# Patient Record
Sex: Female | Born: 1950 | Race: White | Hispanic: No | Marital: Married | Smoking: Never smoker
Health system: Southern US, Community
[De-identification: ages and names within clinical notes are randomized; demographics above are authoritative.]

## PROBLEM LIST (undated history)

## (undated) DIAGNOSIS — I471 Supraventricular tachycardia, unspecified: Secondary | ICD-10-CM

## (undated) DIAGNOSIS — Z8679 Personal history of other diseases of the circulatory system: Secondary | ICD-10-CM

## (undated) DIAGNOSIS — B351 Tinea unguium: Secondary | ICD-10-CM

## (undated) DIAGNOSIS — R351 Nocturia: Secondary | ICD-10-CM

## (undated) DIAGNOSIS — O00109 Unspecified tubal pregnancy without intrauterine pregnancy: Secondary | ICD-10-CM

## (undated) DIAGNOSIS — M549 Dorsalgia, unspecified: Secondary | ICD-10-CM

## (undated) DIAGNOSIS — Z8701 Personal history of pneumonia (recurrent): Secondary | ICD-10-CM

## (undated) DIAGNOSIS — J329 Chronic sinusitis, unspecified: Secondary | ICD-10-CM

## (undated) DIAGNOSIS — I219 Acute myocardial infarction, unspecified: Secondary | ICD-10-CM

## (undated) DIAGNOSIS — G8929 Other chronic pain: Secondary | ICD-10-CM

## (undated) DIAGNOSIS — M542 Cervicalgia: Secondary | ICD-10-CM

## (undated) DIAGNOSIS — D126 Benign neoplasm of colon, unspecified: Secondary | ICD-10-CM

## (undated) DIAGNOSIS — R252 Cramp and spasm: Secondary | ICD-10-CM

## (undated) DIAGNOSIS — K219 Gastro-esophageal reflux disease without esophagitis: Secondary | ICD-10-CM

## (undated) DIAGNOSIS — I10 Essential (primary) hypertension: Secondary | ICD-10-CM

## (undated) HISTORY — DX: Cramp and spasm: R25.2

## (undated) HISTORY — DX: Nocturia: R35.1

## (undated) HISTORY — DX: Other chronic pain: G89.29

## (undated) HISTORY — DX: Supraventricular tachycardia: I47.1

## (undated) HISTORY — DX: Acute myocardial infarction, unspecified: I21.9

## (undated) HISTORY — PX: OOPHORECTOMY: SHX86

## (undated) HISTORY — PX: POLYPECTOMY: SHX149

## (undated) HISTORY — DX: Personal history of other diseases of the circulatory system: Z86.79

## (undated) HISTORY — DX: Chronic sinusitis, unspecified: J32.9

## (undated) HISTORY — DX: Personal history of pneumonia (recurrent): Z87.01

## (undated) HISTORY — DX: Gastro-esophageal reflux disease without esophagitis: K21.9

## (undated) HISTORY — DX: Unspecified tubal pregnancy without intrauterine pregnancy: O00.109

## (undated) HISTORY — DX: Benign neoplasm of colon, unspecified: D12.6

## (undated) HISTORY — DX: Supraventricular tachycardia, unspecified: I47.10

## (undated) HISTORY — DX: Tinea unguium: B35.1

## (undated) HISTORY — DX: Cervicalgia: M54.2

## (undated) HISTORY — PX: TUBAL LIGATION: SHX77

## (undated) HISTORY — PX: APPENDECTOMY: SHX54

## (undated) HISTORY — DX: Essential (primary) hypertension: I10

## (undated) HISTORY — DX: Dorsalgia, unspecified: M54.9

---

## 1997-10-31 ENCOUNTER — Other Ambulatory Visit: Admission: RE | Admit: 1997-10-31 | Discharge: 1997-10-31 | Payer: Self-pay | Admitting: Gynecology

## 1999-05-21 ENCOUNTER — Other Ambulatory Visit: Admission: RE | Admit: 1999-05-21 | Discharge: 1999-05-21 | Payer: Self-pay | Admitting: Gynecology

## 2003-01-08 ENCOUNTER — Other Ambulatory Visit: Admission: RE | Admit: 2003-01-08 | Discharge: 2003-01-08 | Payer: Self-pay | Admitting: Gynecology

## 2003-03-19 ENCOUNTER — Emergency Department (HOSPITAL_COMMUNITY): Admission: EM | Admit: 2003-03-19 | Discharge: 2003-03-19 | Payer: Self-pay | Admitting: Emergency Medicine

## 2004-01-17 ENCOUNTER — Other Ambulatory Visit: Admission: RE | Admit: 2004-01-17 | Discharge: 2004-01-17 | Payer: Self-pay | Admitting: Gynecology

## 2004-01-24 ENCOUNTER — Emergency Department (HOSPITAL_COMMUNITY): Admission: EM | Admit: 2004-01-24 | Discharge: 2004-01-24 | Payer: Self-pay | Admitting: Emergency Medicine

## 2004-02-12 ENCOUNTER — Ambulatory Visit: Payer: Self-pay | Admitting: Cardiology

## 2004-03-18 ENCOUNTER — Ambulatory Visit: Payer: Self-pay | Admitting: Cardiology

## 2004-05-06 ENCOUNTER — Ambulatory Visit: Payer: Self-pay | Admitting: Cardiology

## 2005-03-02 ENCOUNTER — Ambulatory Visit: Payer: Self-pay | Admitting: Internal Medicine

## 2005-09-15 ENCOUNTER — Other Ambulatory Visit: Admission: RE | Admit: 2005-09-15 | Discharge: 2005-09-15 | Payer: Self-pay | Admitting: Gynecology

## 2005-11-12 ENCOUNTER — Ambulatory Visit: Payer: Self-pay | Admitting: Internal Medicine

## 2005-11-25 ENCOUNTER — Ambulatory Visit: Payer: Self-pay | Admitting: Internal Medicine

## 2005-12-15 ENCOUNTER — Ambulatory Visit: Payer: Self-pay | Admitting: Internal Medicine

## 2005-12-16 ENCOUNTER — Ambulatory Visit: Payer: Self-pay | Admitting: Internal Medicine

## 2005-12-22 ENCOUNTER — Ambulatory Visit: Payer: Self-pay | Admitting: Internal Medicine

## 2006-03-17 ENCOUNTER — Ambulatory Visit: Payer: Self-pay | Admitting: Internal Medicine

## 2006-10-12 ENCOUNTER — Other Ambulatory Visit: Admission: RE | Admit: 2006-10-12 | Discharge: 2006-10-12 | Payer: Self-pay | Admitting: Gynecology

## 2006-11-29 ENCOUNTER — Ambulatory Visit: Payer: Self-pay | Admitting: Internal Medicine

## 2007-05-03 ENCOUNTER — Encounter: Payer: Self-pay | Admitting: *Deleted

## 2007-05-03 DIAGNOSIS — M542 Cervicalgia: Secondary | ICD-10-CM | POA: Insufficient documentation

## 2007-05-03 DIAGNOSIS — Z9089 Acquired absence of other organs: Secondary | ICD-10-CM | POA: Insufficient documentation

## 2007-05-03 DIAGNOSIS — O009 Unspecified ectopic pregnancy without intrauterine pregnancy: Secondary | ICD-10-CM | POA: Insufficient documentation

## 2007-05-03 DIAGNOSIS — Z9189 Other specified personal risk factors, not elsewhere classified: Secondary | ICD-10-CM | POA: Insufficient documentation

## 2007-05-03 DIAGNOSIS — D126 Benign neoplasm of colon, unspecified: Secondary | ICD-10-CM | POA: Insufficient documentation

## 2007-05-03 DIAGNOSIS — M549 Dorsalgia, unspecified: Secondary | ICD-10-CM | POA: Insufficient documentation

## 2007-05-03 DIAGNOSIS — J329 Chronic sinusitis, unspecified: Secondary | ICD-10-CM | POA: Insufficient documentation

## 2007-05-03 DIAGNOSIS — Z9079 Acquired absence of other genital organ(s): Secondary | ICD-10-CM | POA: Insufficient documentation

## 2007-05-03 DIAGNOSIS — H8309 Labyrinthitis, unspecified ear: Secondary | ICD-10-CM | POA: Insufficient documentation

## 2007-05-03 DIAGNOSIS — R252 Cramp and spasm: Secondary | ICD-10-CM | POA: Insufficient documentation

## 2007-05-03 DIAGNOSIS — B351 Tinea unguium: Secondary | ICD-10-CM | POA: Insufficient documentation

## 2007-05-03 DIAGNOSIS — J189 Pneumonia, unspecified organism: Secondary | ICD-10-CM | POA: Insufficient documentation

## 2007-05-09 ENCOUNTER — Ambulatory Visit: Payer: Self-pay | Admitting: Internal Medicine

## 2007-05-09 DIAGNOSIS — R351 Nocturia: Secondary | ICD-10-CM | POA: Insufficient documentation

## 2007-05-12 ENCOUNTER — Telehealth: Payer: Self-pay | Admitting: Internal Medicine

## 2007-05-16 ENCOUNTER — Ambulatory Visit: Payer: Self-pay | Admitting: Internal Medicine

## 2007-05-16 LAB — CONVERTED CEMR LAB
BUN: 11 mg/dL (ref 6–23)
Basophils Absolute: 0 10*3/uL (ref 0.0–0.1)
Basophils Relative: 0.5 % (ref 0.0–1.0)
CO2: 31 meq/L (ref 19–32)
Calcium: 9.2 mg/dL (ref 8.4–10.5)
Chloride: 104 meq/L (ref 96–112)
Cholesterol: 163 mg/dL (ref 0–200)
Creatinine, Ser: 0.6 mg/dL (ref 0.4–1.2)
Eosinophils Absolute: 0.2 10*3/uL (ref 0.0–0.6)
Eosinophils Relative: 3.7 % (ref 0.0–5.0)
GFR calc Af Amer: 134 mL/min
GFR calc non Af Amer: 111 mL/min
Glucose, Bld: 96 mg/dL (ref 70–99)
HCT: 37 % (ref 36.0–46.0)
HDL: 63.9 mg/dL (ref >39.0)
Hemoglobin: 12.3 g/dL (ref 12.0–15.0)
LDL Cholesterol: 94 mg/dL (ref 0–99)
Lymphocytes Relative: 48.7 % — ABNORMAL HIGH (ref 12.0–46.0)
MCHC: 33.1 g/dL (ref 30.0–36.0)
MCV: 97.7 fL (ref 78.0–100.0)
Monocytes Absolute: 0.3 10*3/uL (ref 0.2–0.7)
Monocytes Relative: 7.4 % (ref 3.0–11.0)
Neutro Abs: 1.6 10*3/uL (ref 1.4–7.7)
Neutrophils Relative %: 39.7 % — ABNORMAL LOW (ref 43.0–77.0)
Platelets: 179 10*3/uL (ref 150–400)
Potassium: 4.2 meq/L (ref 3.5–5.1)
RBC: 3.79 M/uL — ABNORMAL LOW (ref 3.87–5.11)
RDW: 11.5 % (ref 11.5–14.6)
Sodium: 140 meq/L (ref 135–145)
TSH: 2.15 microintl units/mL (ref 0.35–5.50)
Total CHOL/HDL Ratio: 2.6
Triglycerides: 28 mg/dL (ref 0–149)
VLDL: 6 mg/dL (ref 0–40)
WBC: 4.1 10*3/uL — ABNORMAL LOW (ref 4.5–10.5)

## 2007-05-19 ENCOUNTER — Encounter: Payer: Self-pay | Admitting: Internal Medicine

## 2007-05-26 ENCOUNTER — Encounter: Payer: Self-pay | Admitting: Internal Medicine

## 2007-06-21 ENCOUNTER — Telehealth: Payer: Self-pay | Admitting: Internal Medicine

## 2007-06-22 ENCOUNTER — Telehealth: Payer: Self-pay | Admitting: Internal Medicine

## 2007-06-22 ENCOUNTER — Ambulatory Visit: Payer: Self-pay | Admitting: Internal Medicine

## 2007-06-22 LAB — CONVERTED CEMR LAB
Bilirubin Urine: NEGATIVE
Ketones, ur: NEGATIVE mg/dL
Leukocytes, UA: NEGATIVE
Nitrite: NEGATIVE
Specific Gravity, Urine: 1.02 (ref 1.000–1.03)
Total Protein, Urine: NEGATIVE mg/dL
Urine Glucose: NEGATIVE mg/dL
Urobilinogen, UA: 0.2 (ref 0.0–1.0)
pH: 7 (ref 5.0–8.0)

## 2007-06-23 ENCOUNTER — Telehealth: Payer: Self-pay | Admitting: Internal Medicine

## 2007-07-06 ENCOUNTER — Encounter: Payer: Self-pay | Admitting: Internal Medicine

## 2007-10-10 ENCOUNTER — Other Ambulatory Visit: Admission: RE | Admit: 2007-10-10 | Discharge: 2007-10-10 | Payer: Self-pay | Admitting: Gynecology

## 2008-01-18 ENCOUNTER — Ambulatory Visit: Payer: Self-pay | Admitting: Internal Medicine

## 2008-05-17 ENCOUNTER — Ambulatory Visit: Payer: Self-pay | Admitting: Internal Medicine

## 2008-05-17 DIAGNOSIS — T7840XA Allergy, unspecified, initial encounter: Secondary | ICD-10-CM | POA: Insufficient documentation

## 2008-05-17 DIAGNOSIS — I872 Venous insufficiency (chronic) (peripheral): Secondary | ICD-10-CM | POA: Insufficient documentation

## 2008-05-17 DIAGNOSIS — G4725 Circadian rhythm sleep disorder, jet lag type: Secondary | ICD-10-CM | POA: Insufficient documentation

## 2008-05-18 ENCOUNTER — Encounter: Payer: Self-pay | Admitting: Internal Medicine

## 2008-07-22 ENCOUNTER — Telehealth: Payer: Self-pay | Admitting: Family Medicine

## 2008-08-03 IMAGING — CT CT NECK W/O CM
1 series · 12 of 14 positions shown, 15 images · non-contrast
Comparison: NONE

CLINICAL DATA: Left-sided neck pain.  Evaluate for salivary 
gland  duct stone. 

CT NECK WITHOUT INTRAVENOUS CONTRAST
TECHNIQUE: Intravenous access could not be gained. A series of 
axial 5-mm-thick slices at 3-mm increments were obtained.

[Series 2: wo · axial · 0.45mm/px · z∈[+110,+290]mm · 12 of 72 slices shown, 15 images]
[im 6/72  soft-tissue]
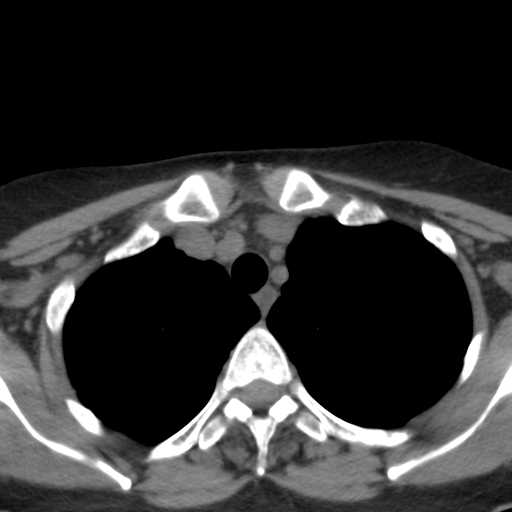
[im 6/72  bone]
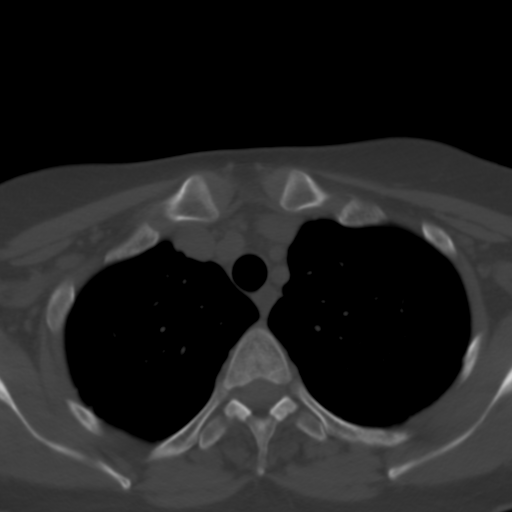
[im 11/72  bone]
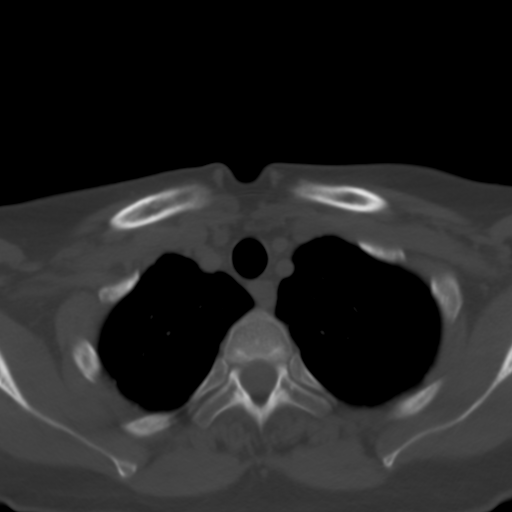
[im 17/72  bone]
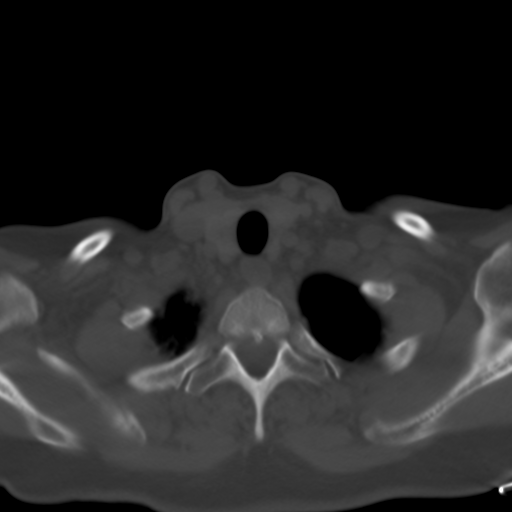
[im 22/72  bone]
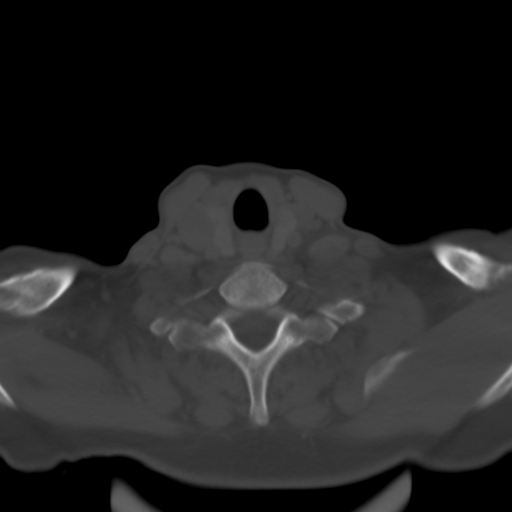
[im 28/72  soft-tissue]
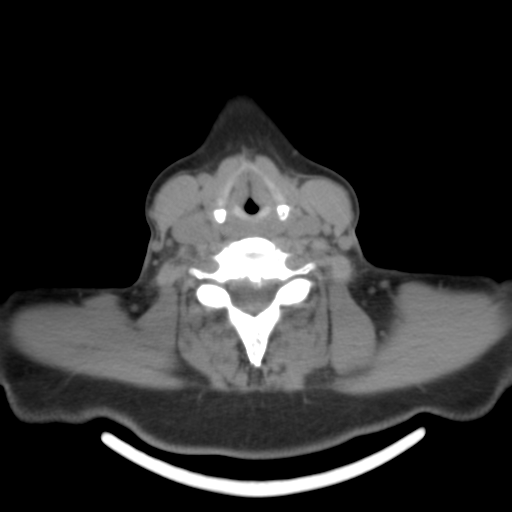
[im 28/72  bone]
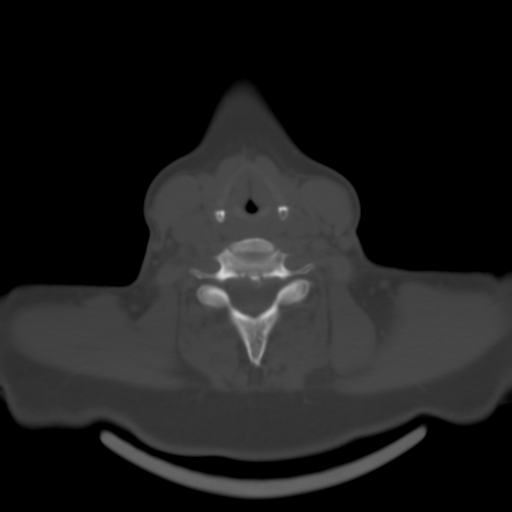
[im 33/72  bone]
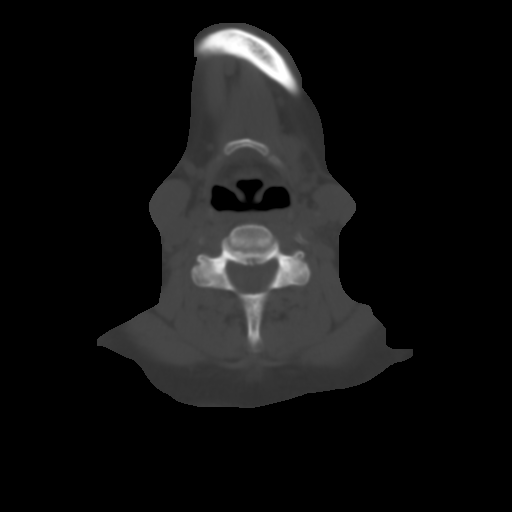
[im 39/72  bone]
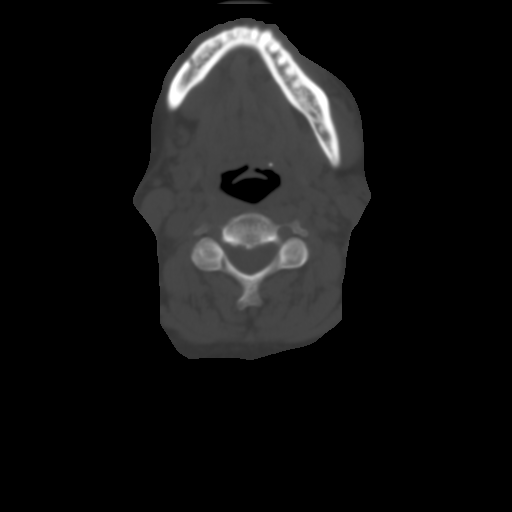
[im 44/72  bone]
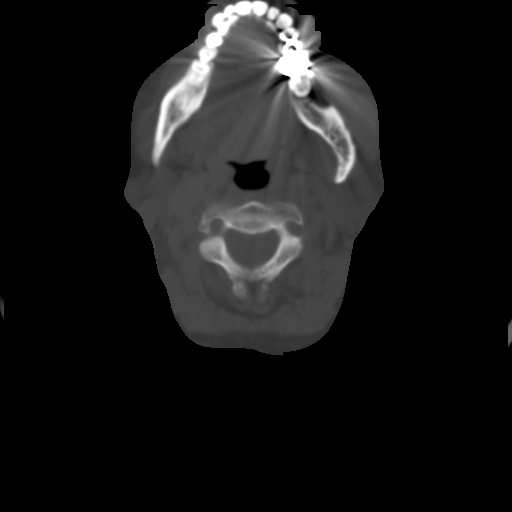
[im 50/72  soft-tissue]
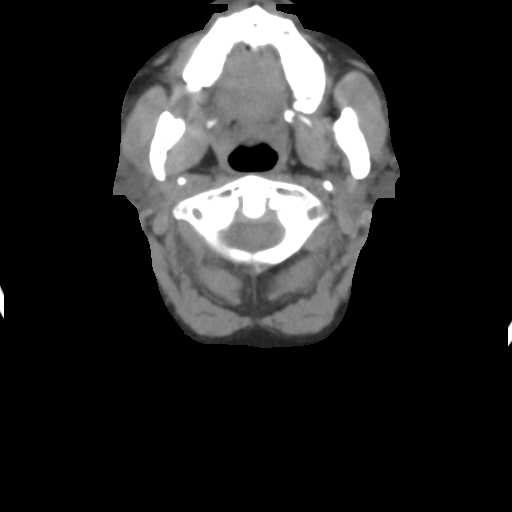
[im 50/72  bone]
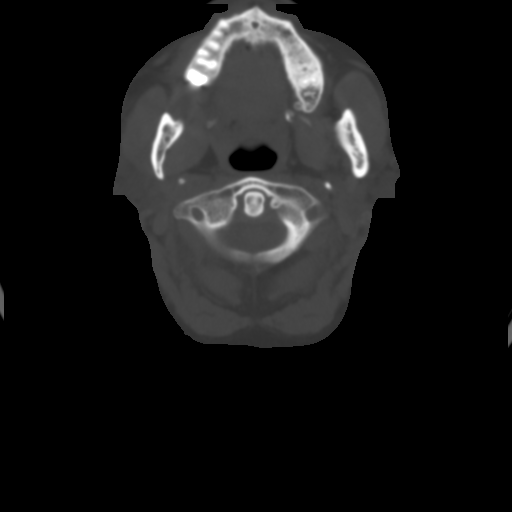
[im 55/72  bone]
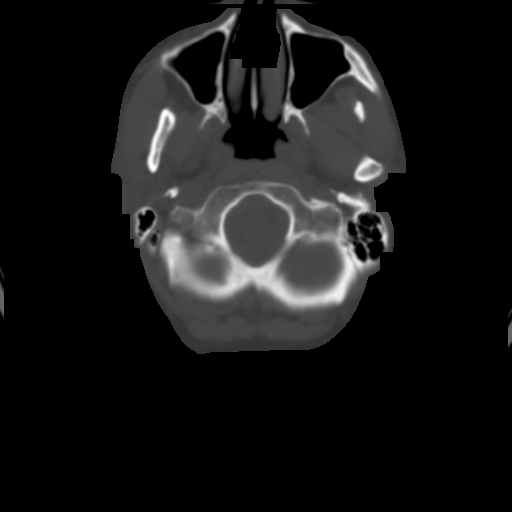
[im 61/72  bone]
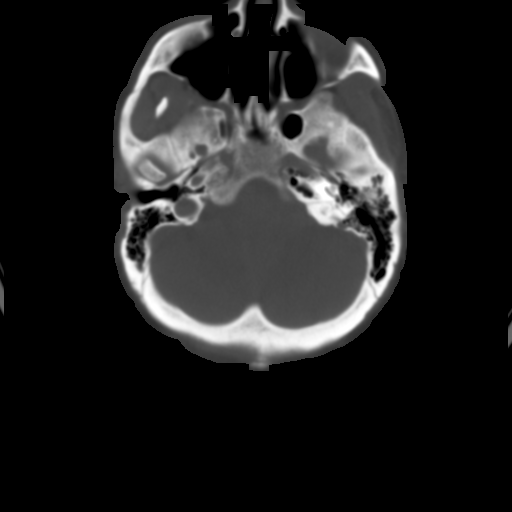
[im 66/72  bone]
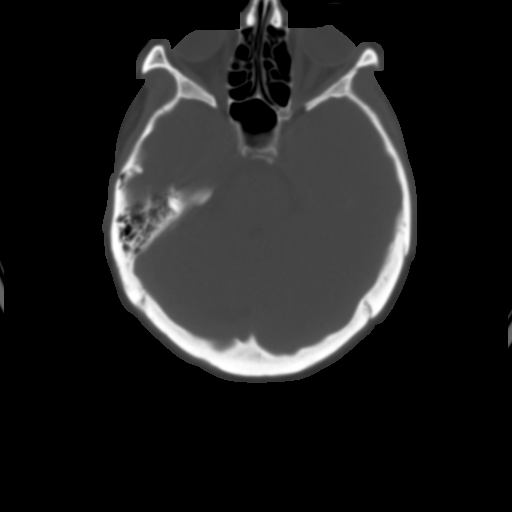

[12 of 14 positions shown; findings below may reference images not displayed]

FINDINGS: There is a calcification in the lingular region on the 
left measuring approximately 3-4 mm, consistent with a salivary 
duct stone.  No parotid calcifications or submandibular gland 
calcifications are noted. There is probable calcification in the 
carotid artery on the left. No evidence of neck mass or 
adenopathy. Thyroid gland appears unremarkable.  No anterior 
superior mediastinal or apical lung masses.  No lytic or blastic 
lesions.
IMPRESSION: Left-sided salivary gland duct stone.  Probable 
calcification in the left carotid artery.  No other findings. 
Date: 05/31/2007  Tran Date:  05/31/2007 DAS  JLM

## 2009-04-20 HISTORY — PX: BREAST EXCISIONAL BIOPSY: SUR124

## 2009-06-20 ENCOUNTER — Telehealth: Payer: Self-pay | Admitting: Internal Medicine

## 2009-06-25 ENCOUNTER — Encounter: Payer: Self-pay | Admitting: Internal Medicine

## 2009-07-29 ENCOUNTER — Telehealth: Payer: Self-pay | Admitting: Internal Medicine

## 2009-08-05 ENCOUNTER — Ambulatory Visit (HOSPITAL_BASED_OUTPATIENT_CLINIC_OR_DEPARTMENT_OTHER): Admission: RE | Admit: 2009-08-05 | Discharge: 2009-08-05 | Payer: Self-pay | Admitting: General Surgery

## 2009-08-27 ENCOUNTER — Encounter: Payer: Self-pay | Admitting: Internal Medicine

## 2009-09-09 ENCOUNTER — Telehealth: Payer: Self-pay | Admitting: Internal Medicine

## 2009-09-10 ENCOUNTER — Telehealth: Payer: Self-pay | Admitting: Internal Medicine

## 2009-12-26 ENCOUNTER — Encounter: Payer: Self-pay | Admitting: Internal Medicine

## 2010-03-12 ENCOUNTER — Telehealth: Payer: Self-pay | Admitting: Internal Medicine

## 2010-04-01 ENCOUNTER — Telehealth: Payer: Self-pay | Admitting: Internal Medicine

## 2010-04-04 ENCOUNTER — Ambulatory Visit: Payer: Self-pay | Admitting: Internal Medicine

## 2010-05-11 ENCOUNTER — Encounter: Payer: Self-pay | Admitting: General Surgery

## 2010-05-16 ENCOUNTER — Other Ambulatory Visit: Payer: Self-pay | Admitting: Internal Medicine

## 2010-05-16 ENCOUNTER — Ambulatory Visit
Admission: RE | Admit: 2010-05-16 | Discharge: 2010-05-16 | Payer: Self-pay | Source: Home / Self Care | Attending: Internal Medicine | Admitting: Internal Medicine

## 2010-05-16 LAB — URINALYSIS, ROUTINE W REFLEX MICROSCOPIC
Bilirubin Urine: NEGATIVE
Hemoglobin, Urine: NEGATIVE
Ketones, ur: NEGATIVE
Nitrite: NEGATIVE
Specific Gravity, Urine: 1.025 (ref 1.000–1.030)
Total Protein, Urine: NEGATIVE
Urine Glucose: 250
Urobilinogen, UA: 0.2 (ref 0.0–1.0)
pH: 5.5 (ref 5.0–8.0)

## 2010-05-16 LAB — CBC WITH DIFFERENTIAL/PLATELET
Basophils Absolute: 0 10*3/uL (ref 0.0–0.1)
Basophils Relative: 0.6 % (ref 0.0–3.0)
Eosinophils Absolute: 0.2 10*3/uL (ref 0.0–0.7)
Eosinophils Relative: 3.4 % (ref 0.0–5.0)
HCT: 35.8 % — ABNORMAL LOW (ref 36.0–46.0)
Hemoglobin: 12.4 g/dL (ref 12.0–15.0)
Lymphocytes Relative: 48.6 % — ABNORMAL HIGH (ref 12.0–46.0)
Lymphs Abs: 2.4 10*3/uL (ref 0.7–4.0)
MCHC: 34.8 g/dL (ref 30.0–36.0)
MCV: 96.7 fl (ref 78.0–100.0)
Monocytes Absolute: 0.3 10*3/uL (ref 0.1–1.0)
Monocytes Relative: 6.6 % (ref 3.0–12.0)
Neutro Abs: 2 10*3/uL (ref 1.4–7.7)
Neutrophils Relative %: 40.8 % — ABNORMAL LOW (ref 43.0–77.0)
Platelets: 198 10*3/uL (ref 150.0–400.0)
RBC: 3.7 Mil/uL — ABNORMAL LOW (ref 3.87–5.11)
RDW: 12.2 % (ref 11.5–14.6)
WBC: 5 10*3/uL (ref 4.5–10.5)

## 2010-05-16 LAB — BASIC METABOLIC PANEL
BUN: 18 mg/dL (ref 6–23)
CO2: 31 mEq/L (ref 19–32)
Calcium: 9.5 mg/dL (ref 8.4–10.5)
Chloride: 105 mEq/L (ref 96–112)
Creatinine, Ser: 0.7 mg/dL (ref 0.4–1.2)
GFR: 99.69 mL/min (ref >60.00)
Glucose, Bld: 93 mg/dL (ref 70–99)
Potassium: 4.7 mEq/L (ref 3.5–5.1)
Sodium: 142 mEq/L (ref 135–145)

## 2010-05-16 LAB — HEPATIC FUNCTION PANEL
ALT: 25 U/L (ref 0–35)
AST: 28 U/L (ref 0–37)
Albumin: 3.7 g/dL (ref 3.5–5.2)
Alkaline Phosphatase: 78 U/L (ref 39–117)
Bilirubin, Direct: 0.1 mg/dL (ref 0.0–0.3)
Total Bilirubin: 0.5 mg/dL (ref 0.3–1.2)
Total Protein: 6.6 g/dL (ref 6.0–8.3)

## 2010-05-16 LAB — LIPID PANEL
Cholesterol: 196 mg/dL (ref 0–200)
HDL: 66.6 mg/dL (ref >39.00)
LDL Cholesterol: 126 mg/dL — ABNORMAL HIGH (ref 0–99)
Total CHOL/HDL Ratio: 3
Triglycerides: 18 mg/dL (ref 0.0–149.0)
VLDL: 3.6 mg/dL (ref 0.0–40.0)

## 2010-05-16 LAB — TSH: TSH: 2.06 u[IU]/mL (ref 0.35–5.50)

## 2010-05-18 LAB — CONVERTED CEMR LAB
BUN: 16 mg/dL (ref 6–23)
CO2: 30 meq/L (ref 19–32)
Calcium: 8.7 mg/dL (ref 8.4–10.5)
Chloride: 103 meq/L (ref 96–112)
Creatinine, Ser: 0.6 mg/dL (ref 0.4–1.2)
GFR calc Af Amer: 133 mL/min
GFR calc non Af Amer: 110 mL/min
Glucose, Bld: 100 mg/dL — ABNORMAL HIGH (ref 70–99)
Potassium: 4 meq/L (ref 3.5–5.1)
Sodium: 136 meq/L (ref 135–145)

## 2010-05-19 ENCOUNTER — Encounter: Payer: Self-pay | Admitting: Internal Medicine

## 2010-05-19 ENCOUNTER — Ambulatory Visit
Admission: RE | Admit: 2010-05-19 | Discharge: 2010-05-19 | Payer: Self-pay | Source: Home / Self Care | Attending: Internal Medicine | Admitting: Internal Medicine

## 2010-05-22 NOTE — Progress Notes (Signed)
Summary: REQ FOR RX  Phone Note Call from Patient Call back at [phone]   Summary of Call: Pt req rx for antibiotic. She has just returned from business trip. C/o urinary pain & odor, fever, chills and body aches. I suggested she come in for office visit. Pt says she is unable b/c she feels so badly that she can not get out of bed to come in.  Initial call taken by: Lamar Sprinkles, CMA,  June 20, 2009 9:40 AM  Follow-up for Phone Call        OK Cipro OV or ER if sick Follow-up by: Tresa Garter MD,  June 20, 2009 11:51 AM  Additional Follow-up for Phone Call Additional follow up Details #1::        pt informed and states she would go to the er if symptoms got worse. I told her to call the office before she decides to go to ER. Additional Follow-up by: Ami Bullins CMA,  June 20, 2009 11:57 AM    New/Updated Medications: CIPROFLOXACIN HCL 250 MG TABS (CIPROFLOXACIN HCL) 1 by mouth two times a day for cystitis Prescriptions: CIPROFLOXACIN HCL 250 MG TABS (CIPROFLOXACIN HCL) 1 by mouth two times a day for cystitis  #10 x 0   Entered and Authorized by:   Tresa Garter MD   Signed by:   Bill Salinas CMA on 06/20/2009   Method used:   Electronically to        [facility]. [phone]* (retail)       [address]       [place], [place]  [address]       Ph: [phone]       Fax: [phone]   RxID:   [id number]

## 2010-05-22 NOTE — Letter (Signed)
Summary: [facility]   Imported By: Lester Bethel 09/12/2009 09:00:33  _____________________________________________________________________  External Attachment:    Type:   Image     Comment:   External Document

## 2010-05-22 NOTE — Assessment & Plan Note (Signed)
Summary: back pain/wants referral/SD   Vital Signs:  Patient profile:   60 year old female Height:      65 inches Weight:      147 pounds BMI:     24.55 O2 Sat:      98 % on Room air Temp:     98.0 degrees F oral Pulse rate:   68 / minute BP sitting:   104 / 68  (left arm) Cuff size:   large  Vitals Entered By: Bill Salinas CMA (April 04, 2010 11:45 AM)  O2 Flow:  Room air CC: pt here with c/o back pain x 1 month off and on/ ab   Primary Care Cristhian Vanhook:  Jacques Navy MD  CC:  pt here with c/o back pain x 1 month off and on/ ab.  History of Present Illness: Ms. Kristin Torres presents for evaluation of low back pain. This has been a long standing problem for which she has seen a chiropractor. She recently saw a new chiropractor who she felt had been to agressive in his manipulation. She is having low back pain, but no radicular symptoms: no paresthesia in the LE, no motor weakness, no shooting lancinating pain. She has no limitations in her activities.  In addition to low back pain she reports discomfort in the upper back and shoulder which seh attributes to many hours at the computer at work. No UE paresthesia, weakness or lancinating pain.   Current Medications (verified): 1)  Allegra 180 Mg Tabs (Fexofenadine Hcl) .... Take 1 Tablet By Mouth Once A Day As Needed 2)  Diltiazem Hcl Coated Beads 120 Mg Cp24 (Diltiazem Hcl Coated Beads) .... Take 1 Tablet By Mouth Once A Day 3)  Zaleplon 10 Mg Caps (Zaleplon) .Marland Kitchen.. 1 By Mouth At Bedtime As Needed For Sleep  Allergies (verified): 1)  ! Penicillin 2)  ! Streptomycin  Past History:  Past Medical History: Last updated: 05/17/2008 POLYP, COLON (ICD-211.3) NECK PAIN, CHRONIC (ICD-723.1) BACK PAIN, CHRONIC (ICD-724.5) SUPRAVENTRICULAR TACHYCARDIA, HX OF (ICD-V12.59) Hx of LEG CRAMPS (ICD-729.82) Hx of ONYCHOMYCOSIS (ICD-110.1) HEMATOCHEZIA, HX OF MILD (ICD-V12.79) LABYRINTHITIS, ACUTE (ICD-386.30) Hx of SINUSITIS  (ICD-473.9) Hx of PNEUMONIA (ICD-486) late 90's     Past Surgical History: Last updated: 05/17/2008 POLYPECTOMY, HX OF (ICD-V15.9) OOPHORECTOMY, RIGHT, HX OF (ICD-V45.77) Hx of ECTOPIC PREGNANCY (ICD-633.90) APPENDECTOMY, HX OF (ICD-V45.79)  G7P2 1 ectopic 4 TAB    Social History: Last updated: 05/17/2008 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  Physical Exam  General:  Well-developed,well-nourished,in no acute distress; alert,appropriate and cooperative throughout examination Head:  normocephalic and atraumatic.   Neck:  supple and full ROM.   Chest Wall:  no deformities.   Lungs:  normal respiratory effort.   Heart:  normal rate and regular rhythm.   Msk:  back exam: able to stand without assist; flex 180 degrees; nl gait, toe/heel walk, step-up, SLR sitting, DTRs at patellar tendon, nl sensation. No CVAT to percussion. Tender over he SI joints right greater than left. No tenderness in the buttocks.  Pulses:  2+ radial Neurologic:  alert & oriented X3 and cranial nerves II-XII intact.   Skin:  turgor normal and color normal.  Psych:  Oriented X3, normally interactive, good eye contact, and not anxious appearing.     Impression & Recommendations:  Problem # 1:  BACK PAIN, CHRONIC (ICD-724.5) acute on chronic back pain with no radicular findings to suggest nerve compression. Greatest tenderness over SI joints. Also with upper back pain with no specific radicular findings. Finding c/w sacro-illiac inflammation.  Plan - stretch and exercise - referred patient to YouTube for videos of specific routines           provided a Rx for  Intergrative Therapies for her to use if she feels this will help           OTC NSAIDs with caution           heat, linement of choice           Insure good ergonomics at her work station and good posture.  Complete Medication List: 1)  Allegra 180 Mg Tabs (Fexofenadine hcl) .... Take 1 tablet by mouth once a day as needed 2)  Diltiazem Hcl Coated Beads 120 Mg Cp24 (Diltiazem hcl coated beads) .... Take 1 tablet by mouth once a day 3)  Zaleplon 10 Mg Caps (Zaleplon) .Marland Kitchen.. 1 by mouth at bedtime as needed for sleep   Orders Added: 1)  Est. Patient Level III [57846]

## 2010-05-22 NOTE — Progress Notes (Signed)
Summary: Medication Change  Phone Note From Pharmacy   Caller: [facility]  [place]. #[id number]* Summary of Call: Pharmacy requesting to change Triamcinolone 0.5% to 0.1% as 0.5% on back order. Initial call taken by: Scharlene Gloss,  Sep 10, 2009 9:23 AM  Follow-up for Phone Call        OK Betamethazone - emailed Follow-up by: Tresa Garter MD,  Sep 10, 2009 5:46 PM    New/Updated Medications: BETAMETHASONE VALERATE 0.1 % CREA (BETAMETHASONE VALERATE) use tid Prescriptions: BETAMETHASONE VALERATE 0.1 % CREA (BETAMETHASONE VALERATE) use tid  #60 g x 1   Entered and Authorized by:   Tresa Garter MD   Signed by:   Tresa Garter MD on 09/10/2009   Method used:   Electronically to        [facility]. [phone]* (retail)       [address]       [place], [place]  [address]       Ph: [phone]       Fax: [phone]   RxID:   [id number]

## 2010-05-22 NOTE — Progress Notes (Signed)
Summary: Med problem  Phone Note Call from Patient Call back at Work Phone [phone]   Summary of Call: Patient is requesting that we call pharmacy b/c there was an rx prescribed that was not covered.  Initial call taken by: Lamar Sprinkles, CMA,  Sep 09, 2009 2:25 PM  Follow-up for Phone Call        Rx changed, see other phone note Follow-up by: Lamar Sprinkles, CMA,  Sep 11, 2009 8:10 AM

## 2010-05-22 NOTE — Progress Notes (Signed)
Phone Note Call from Patient   Summary of Call: Came in w/mother. C/o L foream, itching and redness x 2 d after yard work. Initial call taken by: Tresa Garter MD,  Sep 09, 2009 11:06 AM  Follow-up for Phone Call        Large erythema 10 cm w/sharp borders and a small cluster of blisters on L forearm - Contact derm and cellulitis. See meds. Follow-up by: Tresa Garter MD,  Sep 09, 2009 11:06 AM    New/Updated Medications: DOXYCYCLINE HYCLATE 100 MG CAPS (DOXYCYCLINE HYCLATE) 1 by mouth two times a day with a glass of water PREDNISONE 10 MG TABS (PREDNISONE) Take 40mg  qd for 3 days, then 20 mg qd for 3 days, then 10mg  qd for 6 days, then stop. Take pc. TRIAMCINOLONE ACETONIDE 0.5 % CREA (TRIAMCINOLONE ACETONIDE) use 4 times a day as needed Prescriptions: DOXYCYCLINE HYCLATE 100 MG CAPS (DOXYCYCLINE HYCLATE) 1 by mouth two times a day with a glass of water  #20 x 0   Entered and Authorized by:   Tresa Garter MD   Signed by:   Tresa Garter MD on 09/09/2009   Method used:   Print then Give to Patient   RxID:   [id number] PREDNISONE 10 MG TABS (PREDNISONE) Take 40mg  qd for 3 days, then 20 mg qd for 3 days, then 10mg  qd for 6 days, then stop. Take pc.  #24 x 1   Entered and Authorized by:   Tresa Garter MD   Signed by:   Tresa Garter MD on 09/09/2009   Method used:   Print then Give to Patient   RxID:   [id number] TRIAMCINOLONE ACETONIDE 0.5 % CREA (TRIAMCINOLONE ACETONIDE) use 4 times a day as needed  #60 g x 3   Entered and Authorized by:   Tresa Garter MD   Signed by:   Tresa Garter MD on 09/09/2009   Method used:   Print then Give to Patient   RxID:   [id number] TRIAMCINOLONE ACETONIDE 0.5 % CREA (TRIAMCINOLONE ACETONIDE) use 4 times a day as needed  #60 g x 3   Entered and Authorized by:   Tresa Garter MD   Signed by:   Tresa Garter MD on 09/09/2009   Method used:   Electronically to        [facility] [phone]* (retail)       [address]       [place], [place]  [address]       Ph: [phone]       Fax: [phone]   RxID:   [id number] PREDNISONE 10 MG TABS (PREDNISONE) Take 40mg  qd for 3 days, then 20 mg qd for 3 days, then 10mg  qd for 6 days, then stop. Take pc.  #24 x 1   Entered and Authorized by:   Tresa Garter MD   Signed by:   Tresa Garter MD on 09/09/2009   Method used:   Electronically to        [facility]. [phone]* (retail)       [address]       [place], [place]  [address]       Ph: [phone]       Fax: [phone]   RxID:   [id number] DOXYCYCLINE HYCLATE 100 MG CAPS (DOXYCYCLINE HYCLATE) 1 by  mouth two times a day with a glass of water  #20 x 0   Entered and Authorized by:   Tresa Garter MD   Signed by:   Tresa Garter MD on 09/09/2009   Method used:   Electronically to        [facility]. [phone]* (retail)       [address]       [place], [place]  [address]       Ph: [phone]       Fax: [phone]   RxID:   [id number]

## 2010-05-22 NOTE — Progress Notes (Signed)
  Phone Note Refill Request Message from:  Fax from Pharmacy on July 29, 2009 10:59 AM  Refills Requested: Medication #1:  DILTIAZEM HCL COATED BEADS 120 MG CP24 Take 1 tablet by mouth once a day Initial call taken by: Ami Bullins CMA,  July 29, 2009 10:59 AM    Prescriptions: DILTIAZEM HCL COATED BEADS 120 MG CP24 (DILTIAZEM HCL COATED BEADS) Take 1 tablet by mouth once a day  #90 x 4   Entered by:   Ami Bullins CMA   Authorized by:   Jacques Navy MD   Signed by:   Bill Salinas CMA on 07/29/2009   Method used:   Electronically to        [facility]* (mail-order)             ,          Ph: [phone]       Fax: [phone]   RxID:   [id number]

## 2010-05-22 NOTE — Progress Notes (Signed)
Summary: REFERRAL  Phone Note Call from Patient Call back at [phone]   Summary of Call: Patient is requesting referral to Dr Sandria Manly for back pain.  Initial call taken by: Lamar Sprinkles, CMA,  April 01, 2010 12:31 PM  Follow-up for Phone Call        last OV jan '10. Back pain is listed as a chronic problem. 1. is her back pain worse 2. has she ever seen Dr. Sandria Manly for this before? 3. If her pain is worse it would be best to start with OV  Follow-up by: Jacques Navy MD,  April 01, 2010 1:11 PM  Additional Follow-up for Phone Call Additional follow up Details #1::        Pain is same as before, office visit scheduled.  Additional Follow-up by: Lamar Sprinkles, CMA,  April 01, 2010 4:43 PM

## 2010-05-22 NOTE — Progress Notes (Signed)
Summary: RF [facility]ON[facility]  Phone Note Refill Request Message from:  Fax from Pharmacy on March 12, 2010 9:38 AM  Refills Requested: Medication #1:  ZALEPLON 10 MG CAPS 1 by mouth at bedtime as needed for sleep Please Advise refills  Initial call taken by: Ami Bullins CMA,  March 12, 2010 9:38 AM  Follow-up for Phone Call        ok for refills as needed  Follow-up by: Jacques Navy MD,  March 12, 2010 1:32 PM  Additional Follow-up for Phone Call Additional follow up Details #1::        Rx called in w/1 refill b/c it is controlled med and can only have one rf Additional Follow-up by: Lamar Sprinkles, CMA,  March 12, 2010 1:36 PM    Prescriptions: ZALEPLON 10 MG CAPS (ZALEPLON) 1 by mouth at bedtime as needed for sleep  #90 x 1   Entered by:   Lamar Sprinkles, CMA   Authorized by:   Jacques Navy MD   Signed by:   Lamar Sprinkles, CMA on 03/12/2010   Method used:   Telephoned to ...       [facility]. [phone]* (retail)       [address]       [place], [place]  [address]       Ph: [phone]       Fax: [phone]   RxID:   [id number] ZALEPLON 10 MG CAPS (ZALEPLON) 1 by mouth at bedtime as needed for sleep  #90 x 3   Entered by:   Lamar Sprinkles, CMA   Authorized by:   Jacques Navy MD   Signed by:   Lamar Sprinkles, CMA on 03/12/2010   Method used:   Telephoned to ...       [facility]  [place]. [phone]* (retail)       [address]       [place], [place]  [address]       Ph: [phone]       Fax: [phone]   RxID:   [id number]

## 2010-05-23 NOTE — Letter (Signed)
Summary: [facility]   Imported By: Sherian Rein 07/20/2009 09:17:39  _____________________________________________________________________  External Attachment:    Type:   Image     Comment:   External Document

## 2010-05-28 NOTE — Assessment & Plan Note (Signed)
Summary: Cpx/Cign[employer]/#/cd   Vital Signs:  Patient profile:   60 year old female Height:      65 inches Weight:      146 pounds BMI:     24.38 O2 Sat:      97 % on Room air Temp:     97.9 degrees F oral Pulse rate:   73 / minute BP sitting:   110 / 62  (left arm) Cuff size:   regular  Vitals Entered By: Bill Salinas CMA (May 19, 2010 1:33 PM)  O2 Flow:  Room air CC: pt here for cpx/ ab  Vision Screening:      Vision Comments: normal eye exam , no changes within the past year   Primary Care Provider:  Jacques Navy MD  CC:  pt here for cpx/ ab.  History of Present Illness: Patient presents for routine exam.   Patient had an abnormal mammography which lead to other studies which lead to wire guided biospsy which was negative. She has had follow-up mammogram. There were other lesions noted on mammograms but not biopsied. She has not had MRI breast or other advanced studies that I can find.   Husband was hospitalized for HTN and had a TIA and was found to have carotid artery disease.  He has  not had surgery but is on ASA.   Her mother is now 54 and starting to have signs of dementia. The mother does live wth [name]  Her son is divorcing, which is an emotional  challenge for her.  She is having nocturnal hot flashes but this is bearable.   Current Medications (verified): 1)  Allegra 180 Mg Tabs (Fexofenadine Hcl) .... Take 1 Tablet By Mouth Once A Day As Needed 2)  Diltiazem Hcl Coated Beads 120 Mg Cp24 (Diltiazem Hcl Coated Beads) .... Take 1 Tablet By Mouth Once A Day 3)  Zaleplon 10 Mg Caps (Zaleplon) .Marland Kitchen.. 1 By Mouth At Bedtime As Needed For Sleep  Allergies (verified): 1)  ! Penicillin 2)  ! Streptomycin  Past History:  Past Medical History: Last updated: 05/30/08 POLYP, COLON (ICD-211.3) NECK PAIN, CHRONIC (ICD-723.1) BACK PAIN, CHRONIC (ICD-724.5) SUPRAVENTRICULAR TACHYCARDIA, HX OF (ICD-V12.59) Hx of LEG CRAMPS (ICD-729.82) Hx of  ONYCHOMYCOSIS (ICD-110.1) HEMATOCHEZIA, HX OF MILD (ICD-V12.79) LABYRINTHITIS, ACUTE (ICD-386.30) Hx of SINUSITIS (ICD-473.9) Hx of PNEUMONIA (ICD-486) late 90's     Past Surgical History: Last updated: 05/30/08 POLYPECTOMY, HX OF (ICD-V15.9) OOPHORECTOMY, RIGHT, HX OF (ICD-V45.77) Hx of ECTOPIC PREGNANCY (ICD-633.90) APPENDECTOMY, HX OF (ICD-V45.79)  G7P2 1 ectopic 4 TAB    Family History: Last updated: May 30, 2008 father-deceased @ 62: CAD/MI HTN, Lipids mother -  1925 DM, HTN, depression, glaucoma, HOH Maternal Grandmother- breast cancer Paternal Grandmother -  stomach cancer Pos - cardiovascular disease Neg - colon cancer  Social History: Last updated: 05-30-2008 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    Physical Exam  General:  Well-developed,well-nourished,in no acute distress; alert,appropriate and cooperative throughout examination Head:  Normocephalic and atraumatic without obvious abnormalities. No apparent alopecia or balding. Eyes:  vision grossly intact, pupils equal, pupils round, pupils  react to accomodation, and corneas and lenses clear.   Ears:  External ear exam shows no significant lesions or deformities.  Otoscopic examination reveals clear canals, tympanic membranes are intact bilaterally without bulging, retraction, inflammation or discharge. Hearing is grossly normal bilaterally. Nose:  no external deformity, no external erythema, and no nasal discharge.     Mouth:  Oral mucosa and oropharynx without lesions or exudates.  Teeth in good repair. Neck:  supple, full ROM, no thyromegaly, and no carotid bruits.   Chest Wall:  No deformities, masses, or tenderness noted. Breasts:  deferred to gyn Lungs:  Normal respiratory effort, chest expands symmetrically. Lungs are clear to auscultation, no crackles or wheezes. Heart:  Normal rate and regular rhythm. S1 and S2 normal without gallop, murmur, click, rub or other extra sounds. Abdomen:  soft, non-tender, normal bowel sounds, no guarding, no rigidity, and no hepatomegaly.   Genitalia:  deferred to gyn Msk:  normal ROM, no joint tenderness, no joint swelling, and no joint warmth.   Pulses:  2+ radial pulse Neurologic:  alert & oriented X3, cranial nerves II-XII intact, strength normal in all extremities, sensation intact to light touch, gait normal, and DTRs symmetrical and normal.   Skin:  turgor normal, color normal, no rashes, and no suspicious lesions.   Cervical Nodes:  no anterior cervical adenopathy and no posterior cervical adenopathy.   Psych:  Oriented X3, memory intact for recent and remote, normally interactive, good eye contact, and not anxious appearing.     Impression & Recommendations:  Problem # 1:  Preventive Health Care (ICD-V70.0) Interval history negative for any major illness, injury or surgery. She is generally feeling well but does have minor annoyances such as leg cramps, some fleeting joint pain. Limited physical exam is normal. Lab results are within normal limits. She is current with her gynecologist. Last mammogram in EMR is Jan '09. Last colonoscopy is Aug '07 due for follow-up 10 years. No EMR record of tetnus immunization or current flu vaccine. 12 lead EKG is without evidence of ischemia.  In summary - a very nice woman who has had increased burdens this past year. she is noble in the care of her mother. With all that is going on she feels she is handling it well. Her  work is her salvation. She is asked to return as needed or in 1 year. she will obtain copies or reports of her mammography.   Complete Medication List: 1)  Allegra 180 Mg Tabs (Fexofenadine hcl) .... Take 1 tablet by mouth once a day as needed 2)  Diltiazem Hcl Coated Beads 120 Mg Cp24 (Diltiazem hcl coated beads) .... Take 1 tablet by mouth once a day 3)  Zaleplon 10 Mg Caps (Zaleplon) .Marland Kitchen.. 1 by mouth at bedtime as needed for sleep   Patient: Kristin Torres Note: All result statuses are Final unless otherwise noted.  Tests: (1) Lipid Panel (LIPID)   Cholesterol               196 mg/dL                   1-324     ATP III Classification            Desirable:  < 200 mg/dL                    Borderline High:  200 - 239 mg/dL  High:  > = 240 mg/dL   Triglycerides             18.0 mg/dL                  0.4-540.9     Normal:  <150 mg/dL     Borderline High:  811 - 199 mg/dL   HDL                       91.47 mg/dL                 >82.95   VLDL Cholesterol          3.6 mg/dL                   6.2-13.0   LDL Cholesterol      [H]  865 mg/dL                   7-84  CHO/HDL Ratio:  CHD Risk                             3                    Men          Women     1/2 Average Risk     3.4          3.3     Average Risk          5.0          4.4     2X Average Risk          9.6          7.1     3X Average Risk          15.0          11.0                           Tests: (2) BMP (METABOL)   Sodium                    142 mEq/L                   135-145   Potassium                 4.7 mEq/L                   3.5-5.1   Chloride                  105 mEq/L                   96-112   Carbon Dioxide            31 mEq/L                    19-32   Glucose                   93 mg/dL                    69-62   BUN                       18 mg/dL  6-23   Creatinine                0.7 mg/dL                   8.1-1.9   Calcium                   9.5 mg/dL                    1.4-78.2   GFR                       99.69 mL/min                >60.00  Tests: (3) CBC Platelet w/Diff (CBCD)   White Cell Count          5.0 K/uL                    4.5-10.5   Red Cell Count       [L]  3.70 Mil/uL                 3.87-5.11   Hemoglobin                12.4 g/dL                   95.6-21.3   Hematocrit           [L]  35.8 %                      36.0-46.0   MCV                       96.7 fl                     78.0-100.0   MCHC                      34.8 g/dL                   08.6-57.8   RDW                       12.2 %                      11.5-14.6   Platelet Count            198.0 K/uL                  150.0-400.0   Neutrophil %         [L]  40.8 %                      43.0-77.0   Lymphocyte %         [H]  48.6 %                      12.0-46.0   Monocyte %                6.6 %                       3.0-12.0   Eosinophils%              3.4 %  0.0-5.0   Basophils %               0.6 %                       0.0-3.0   Neutrophill Absolute      2.0 K/uL                    1.4-7.7   Lymphocyte Absolute       2.4 K/uL                    0.7-4.0   Monocyte Absolute         0.3 K/uL                    0.1-1.0  Eosinophils, Absolute                             0.2 K/uL                    0.0-0.7   Basophils Absolute        0.0 K/uL                    0.0-0.1  Tests: (4) Hepatic/Liver Function Panel (HEPATIC)   Total Bilirubin           0.5 mg/dL                   6.2-9.5   Direct Bilirubin          0.1 mg/dL                   2.8-4.1   Alkaline Phosphatase      78 U/L                      39-117   AST                       28 U/L                      0-37   ALT                       25 U/L                      0-35   Total Protein             6.6 g/dL                    3.2-4.4   Albumin                   3.7 g/dL                    0.1-0.2  Tests: (5) TSH (TSH)   FastTSH                   2.06 uIU/mL                 0.35-5.50  Tests: (6) UDip w/Micro  (URINE)   Color                     LT. YELLOW       RANGE:  Yellow;Lt. Yellow   Clarity                   CLEAR                       Clear   Specific Gravity          1.025                       1.000 - 1.030   Urine Ph                  5.5                         5.0-8.0   Protein                   NEGATIVE                    Negative   Urine Glucose             250                         Negative     Results faxed to site/floor on 05/16/2010 9:46 AM by Luellen Pucker.   Ketones                   NEGATIVE                    Negative   Urine Bilirubin           NEGATIVE                    Negative   Blood                     NEGATIVE                    Negative   Urobilinogen              0.2                         0.0 - 1.0   Leukocyte Esterace        TRACE                       Negative   Nitrite                   NEGATIVE                    Negative   Urine WBC                 3-6/hpf                     0-2/hpf   Urine Epith               Few(5-10/hpf)               Rare(0-4/hpf)   Urine Bacteria            Few(10-50/hpf)              None  Orders Added: 1)  Est. Patient 40-64 years [[id number]]

## 2010-06-07 ENCOUNTER — Encounter: Payer: Self-pay | Admitting: Internal Medicine

## 2010-06-18 ENCOUNTER — Encounter: Payer: Self-pay | Admitting: Internal Medicine

## 2010-07-08 LAB — POCT HEMOGLOBIN-HEMACUE: Hemoglobin: 12.5 g/dL (ref 12.0–15.0)

## 2010-07-09 LAB — DIFFERENTIAL
Basophils Absolute: 0 10*3/uL (ref 0.0–0.1)
Basophils Relative: 0 % (ref 0–1)
Eosinophils Absolute: 0.1 10*3/uL (ref 0.0–0.7)
Eosinophils Relative: 2 % (ref 0–5)
Lymphocytes Relative: 44 % (ref 12–46)
Lymphs Abs: 2.1 10*3/uL (ref 0.7–4.0)
Monocytes Absolute: 0.3 10*3/uL (ref 0.1–1.0)
Monocytes Relative: 5 % (ref 3–12)
Neutro Abs: 2.3 10*3/uL (ref 1.7–7.7)
Neutrophils Relative %: 48 % (ref 43–77)

## 2010-07-09 LAB — BASIC METABOLIC PANEL
BUN: 18 mg/dL (ref 6–23)
CO2: 32 mEq/L (ref 19–32)
Calcium: 9.3 mg/dL (ref 8.4–10.5)
Chloride: 102 mEq/L (ref 96–112)
Creatinine, Ser: 0.66 mg/dL (ref 0.4–1.2)
GFR calc Af Amer: >60 mL/min (ref >60)
GFR calc non Af Amer: >60 mL/min (ref >60)
Glucose, Bld: 115 mg/dL — ABNORMAL HIGH (ref 70–99)
Potassium: 4.9 mEq/L (ref 3.5–5.1)
Sodium: 140 mEq/L (ref 135–145)

## 2010-07-09 LAB — CBC
HCT: 35.3 % — ABNORMAL LOW (ref 36.0–46.0)
Hemoglobin: 12.2 g/dL (ref 12.0–15.0)
MCHC: 34.5 g/dL (ref 30.0–36.0)
MCV: 97.7 fL (ref 78.0–100.0)
Platelets: 175 10*3/uL (ref 150–400)
RBC: 3.61 MIL/uL — ABNORMAL LOW (ref 3.87–5.11)
RDW: 12.6 % (ref 11.5–15.5)
WBC: 4.7 10*3/uL (ref 4.0–10.5)

## 2010-08-21 ENCOUNTER — Other Ambulatory Visit: Payer: Self-pay | Admitting: Internal Medicine

## 2010-09-05 NOTE — Assessment & Plan Note (Signed)
[facility]                           PRIMARY CARE OFFICE NOTE   CHRISSY, EALEY                        MRN:          [id number]  DATE:03/17/2006                            DOB:          Nov 29, 1950    Ms. Casebolt is a pleasant 60 year old woman who presents on an acute  basis.  She has been having a problem with ongoing back pain and neck  pain with radiation to her right arm.  She reports this is a 30-year  chronic problem.  She oftentimes gets relief with chiropractor.  She  reports that she now has had a three day history of tenderness in the  posterior cervical region, at the hairline, at the base of the occiput.  She denies any fevers.  She has had no recent sickness.   Medication list is not available to me.   PHYSICAL EXAMINATION:  VITAL SIGNS:  Temperature 95.6, blood pressure  110/76, pulse 67, weight 145.  GENERAL APPEARANCE:  A well-nourished, well-groomed, professional-  appearing woman in no acute distress.  MUSCULOSKELETAL:  Patient has full range of motion of her neck, actively  and passively.  She has full range of motion of her upper extremities.  She does not seem to be in pain at this time.   Patient has an easily palpable area at the posterior cervical region  just at the hairline, where there seems to be a subcutaneous nodule.  This area is mildly tender.  There is no apparent lesion.   IMPRESSION:  Nonspecific nodule, posterior cervical region.  Suspect  this may be subcutaneous nodule, benign origin.  It is not in the right  location for being a lymphatic.   PLAN:  1. Watchful waiting.  2. Chronic back and neck pain.  I have discussed alternative therapies      with the patient and have given her referral to integrative      therapies for evaluation, treatment plan, and training to reduce      her discomfort.  She will return to see me on a p.r.n. basis.     Rosalyn Gess Norins, MD  Electronically Signed    MEN/MedQ  DD: 03/18/2006  DT: 03/18/2006  Job #: [id number]

## 2010-11-27 ENCOUNTER — Ambulatory Visit (INDEPENDENT_AMBULATORY_CARE_PROVIDER_SITE_OTHER): Payer: Managed Care, Other (non HMO) | Admitting: Sports Medicine

## 2010-11-27 DIAGNOSIS — M25562 Pain in left knee: Secondary | ICD-10-CM | POA: Insufficient documentation

## 2010-11-27 DIAGNOSIS — M25561 Pain in right knee: Secondary | ICD-10-CM

## 2010-11-27 DIAGNOSIS — M25569 Pain in unspecified knee: Secondary | ICD-10-CM

## 2010-11-27 NOTE — Patient Instructions (Signed)
Please do suggested knee rehab exercises daily Try knee strap when doing activity Ok to continue biking if it is not causing pain Ice your knee at the end of the day if it feels warm or swollen Please return for a follow up appointment for 6 weeks

## 2010-11-27 NOTE — Assessment & Plan Note (Signed)
She was given a series of hip exercises and step up exercises  She was given some bent knee extensions while squeezing a ball  use a patellar tendon strap on the left  Ice at the end of the day Recheck in 2 months

## 2010-11-27 NOTE — Progress Notes (Signed)
  Subjective:    Patient ID: Kristin Torres, female    DOB: 1950/11/18, 60 y.o.   MRN: [id number]  HPI Took a [place] bike vacation Started getting ready in April and set her seat height high Got to [place] and kept seat this way - doing 30 miles per day for 8 days Biking usually eases pain now that her CT high has been lowered to the correct position.  Has stayed active despite anterior knee pain which started in May L > R. Also has back pain that she saw chiropractor for, but pain persists.    Review of Systems     Objective:   Physical Exam No acute distress  Loss of long arch bilat Lateral deviation of great toes bilat, no true bunion formation No calcaneal valgus Good post tib function Good great toe motion bilat  Full extension of both knees Good alignment Left knee is warm and slightly puffier than rt Full flexion bilat Patellar compression positive bilat, superiorly only Good abduction strength on lt Moderately weak abduction strength on rt Mild crepitation on rt with flexion and extension Superior grind on lt with flexion and extension  McMurray's and ligamentous testing on both knees was unremarkable      Assessment & Plan:

## 2010-12-09 ENCOUNTER — Encounter: Payer: Self-pay | Admitting: Internal Medicine

## 2010-12-15 ENCOUNTER — Encounter: Payer: Self-pay | Admitting: Internal Medicine

## 2010-12-18 ENCOUNTER — Encounter: Payer: Self-pay | Admitting: Internal Medicine

## 2010-12-18 NOTE — Telephone Encounter (Signed)
ERROR

## 2011-01-08 ENCOUNTER — Ambulatory Visit (INDEPENDENT_AMBULATORY_CARE_PROVIDER_SITE_OTHER): Payer: Managed Care, Other (non HMO) | Admitting: Sports Medicine

## 2011-01-08 VITALS — BP 102/60

## 2011-01-08 DIAGNOSIS — M25569 Pain in unspecified knee: Secondary | ICD-10-CM

## 2011-01-08 DIAGNOSIS — M545 Low back pain, unspecified: Secondary | ICD-10-CM

## 2011-01-08 DIAGNOSIS — M25561 Pain in right knee: Secondary | ICD-10-CM

## 2011-01-08 MED ORDER — MELOXICAM 15 MG PO TABS: ORAL_TABLET | ORAL | Status: AC

## 2011-01-08 NOTE — Progress Notes (Signed)
  Subjective:    Patient ID: Kristin Torres, female    DOB: [birth year], 60 y.o.   MRN: [id number]  HPI  Coming today to f/u on B/L knee pain which has improve, she is pain free on her knees. She used the knee strap on her knees, did her strengthening exercises and changed the setting of her bicycle which helped with her knee pain.  She is also complaining of Low back pain, for the last year, on and off, 2/10, no radiated, improved by resting, worsen with walking long distance. No previous injuries. She has seen a chiropractor twice in the last year and an sport medicine doctor who did x ray of her low back without mayor findings (by the patient ) No numbness or tingling, no weakness, no saddle anesthesia, no urinary or fecal incontinence.   Review of Systems  Constitutional: Negative for fever, chills and fatigue.  HENT: Negative for neck pain.   Cardiovascular: Negative for leg swelling.  Musculoskeletal: Negative for myalgias, joint swelling and gait problem.  Skin: Negative for color change and pallor.  Neurological: Negative for weakness and numbness.       Objective:   Physical Exam  Constitutional: She appears well-developed and well-nourished.       BP 102/60   Eyes: EOM are normal.  Pulmonary/Chest: Effort normal.  Musculoskeletal:       Low back with intact skin. No swelling, no hematomas. FROM for flexion, extension, rotation and lateralization.  Tenderness to palpation on LB area [facility] test negative for SI joint pain. Straight leg raise negative B/L. Strength 5/5 for hip flexion and extension, 5/5 for knee flexion and extension, 5/5 for ankle plantar and dorsal flexion B/L. DTR patellar and achilles II/IV B/L Sensation intact distally B/L. No leg discrepancy.   Neurological: She is alert.  Skin: No rash noted. No erythema. No pallor.  Psychiatric: She has a normal mood and affect.          Assessment & Plan:   1. Knee pain, bilateral    2. Low back pain   meloxicam (MOBIC) 15 MG tablet  Low back exercise shown and explained to the patient Recommended to continue yoga that she recently started Mois heat Mobic 15 mg po qd for 10 days F/u in 4 weeks

## 2011-01-27 ENCOUNTER — Ambulatory Visit (AMBULATORY_SURGERY_CENTER): Payer: Managed Care, Other (non HMO) | Admitting: *Deleted

## 2011-01-27 ENCOUNTER — Telehealth: Payer: Self-pay | Admitting: *Deleted

## 2011-01-27 ENCOUNTER — Encounter: Payer: Self-pay | Admitting: Internal Medicine

## 2011-01-27 VITALS — Ht 64.57 in | Wt 146.0 lb

## 2011-01-27 DIAGNOSIS — Z1211 Encounter for screening for malignant neoplasm of colon: Secondary | ICD-10-CM

## 2011-01-27 MED ORDER — PEG-KCL-NACL-NASULF-NA ASC-C 100 G PO SOLR: ORAL | Status: DC

## 2011-01-27 NOTE — Telephone Encounter (Signed)
Need colonoscopy report and Path report in order to act on this telephone call. I am out of town but will review it if You scan the information into the EPIC. thanx DB

## 2011-01-27 NOTE — Telephone Encounter (Signed)
Dr. 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  Her last colon was in 2007.  She said she received a letter for recall from [place].  I did her Previsit and told her I would check with you   re: doing her colon this year 2012 or waiting  until 2014.  Please advise.  Thank you, Sarina Ill

## 2011-01-28 ENCOUNTER — Encounter: Payer: Self-pay | Admitting: Internal Medicine

## 2011-01-28 NOTE — Telephone Encounter (Signed)
I have sent colonoscopy reports from 12/16/05 (no polyps found) and 12/07/02 (adenomatous polyp) as well as the pathology report from 12/07/02 down for scanning. The reports should be available for your review in the near future.

## 2011-01-29 ENCOUNTER — Telehealth: Payer: Self-pay | Admitting: Internal Medicine

## 2011-01-29 NOTE — Telephone Encounter (Signed)
Pt is calling to see if Dr. Juanda Chance has reviewed her previous procedure reports.  I told her that Karen Kitchens would call her when Dr. Juanda Chance has reviewed reports.  [name] is aware of phone call. Kristin Torres

## 2011-01-29 NOTE — Telephone Encounter (Signed)
I have reviewed the colon report from 2004 and 2007 and the Path report from 2004. Her  Recall date  According to the guidelines  Is 10 years,  In 2017.

## 2011-01-30 NOTE — Telephone Encounter (Signed)
Left message for patient to call back  

## 2011-01-30 NOTE — Telephone Encounter (Signed)
Patient states that she is uncomfortable waiting 10 years for repeat colonoscopy since she had polyp in 2004. I have advised patient that as per new guidelines, she is safe to wait 10 years and Dr Juanda Chance has also looked at her procedure reports and feels comfortable with this. Explained that should she have problems before then, we could reassess her situation at that time. Patient, however, would like Dr Juanda Chance to do the colonoscopy at 5 year intervals. Patient is apparently already scheduled for colonoscopy 02/05/11 and wants to know if she can just keep this appointment.

## 2011-01-30 NOTE — Telephone Encounter (Signed)
OK to keep the appointment for colonoscopy in Dec 2012

## 2011-02-02 NOTE — Telephone Encounter (Signed)
Left message for patient that Dr Juanda Chance is okay with her keeping her colonoscopy in 2012.

## 2011-02-05 ENCOUNTER — Other Ambulatory Visit: Payer: Managed Care, Other (non HMO) | Admitting: Internal Medicine

## 2011-03-04 ENCOUNTER — Other Ambulatory Visit: Payer: Managed Care, Other (non HMO) | Admitting: Internal Medicine

## 2011-03-17 ENCOUNTER — Ambulatory Visit (INDEPENDENT_AMBULATORY_CARE_PROVIDER_SITE_OTHER): Payer: Managed Care, Other (non HMO) | Admitting: Internal Medicine

## 2011-03-17 ENCOUNTER — Encounter: Payer: Self-pay | Admitting: Internal Medicine

## 2011-03-17 DIAGNOSIS — F341 Dysthymic disorder: Secondary | ICD-10-CM

## 2011-03-17 DIAGNOSIS — F418 Other specified anxiety disorders: Secondary | ICD-10-CM

## 2011-03-17 MED ORDER — SERTRALINE HCL 50 MG PO TABS: 50.0000 mg | ORAL_TABLET | Freq: Every day | ORAL | Status: DC

## 2011-03-17 MED ORDER — LORAZEPAM 0.5 MG PO TABS: 0.5000 mg | ORAL_TABLET | Freq: Three times a day (TID) | ORAL | Status: AC | PRN

## 2011-03-17 NOTE — Patient Instructions (Signed)
For depression and anxiety please try zoloft (sertraline) 50mg  once a day and the ativan (lorazepam) 0.5mg   Every 8 hours as needed. Problem oriented focused talk therapy is also extremely valuable. Please return in 3-4 weeks for follow-up and feel free to call for any problems in the interval.

## 2011-03-17 NOTE — Progress Notes (Signed)
  Subjective:    Patient ID: Kristin Torres, female    DOB: 13-Apr-1951, 60 y.o.   MRN: [id number]  HPI Kristin Torres presents with symptoms of depression. She has had sleep disruption, sadness, anxiety, worry, anhedonia, loss of energy and motivation. There are many external issues: worry about her mother, a son's divorce. She has been taking ativan with relief of her symptoms. She has had no counseling. She denies any suicidal ideation  I have reviewed the patient's medical history in detail and updated the computerized patient record.   Review of Systems System review is negative for any constitutional, cardiac, pulmonary, GI or neuro symptoms or complaints other than as described in the HPI.     Objective:   Physical Exam Vitals - reviewed and are stable \\Gen 'l- WNWD woman in no distress Cor - RRR Pulm - normal respirations. Psych - see HPI       Assessment & Plan:

## 2011-03-17 NOTE — Assessment & Plan Note (Signed)
pateint with situational depression with anxiety. She has multiple vegative symptoms but has no suicidal ideation. Discussed dx: she worries more than lacks motivation. Discussed need for counseling in addition to medication.  Plan - sertraline 50mg  qd            Lorazepam 0.5 mg tid prn            Strongly urged to seek counseling - provided tele # [facility] and Dr. Jen Mow.            ROV 3-4 weeks.

## 2011-03-27 ENCOUNTER — Telehealth: Payer: Self-pay | Admitting: *Deleted

## 2011-03-27 MED ORDER — ESCITALOPRAM OXALATE 10 MG PO TABS: 10.0000 mg | ORAL_TABLET | Freq: Every day | ORAL | Status: DC

## 2011-03-27 NOTE — Telephone Encounter (Signed)
Informed pt .

## 2011-03-27 NOTE — Telephone Encounter (Signed)
Ok to trey lexapro 10 mg qd Rx eScribed

## 2011-03-27 NOTE — Telephone Encounter (Signed)
Pt states sertraline medication makes her extremely nauseated, she is wanting to change medications and did mention lexapro. Please advise

## 2011-07-06 ENCOUNTER — Encounter: Payer: Self-pay | Admitting: Internal Medicine

## 2011-09-09 ENCOUNTER — Other Ambulatory Visit: Payer: Self-pay | Admitting: Internal Medicine

## 2011-09-09 NOTE — Telephone Encounter (Signed)
[facility]

## 2011-10-14 ENCOUNTER — Telehealth: Payer: Self-pay | Admitting: Internal Medicine

## 2011-10-14 ENCOUNTER — Ambulatory Visit: Payer: Managed Care, Other (non HMO) | Admitting: Internal Medicine

## 2011-10-14 ENCOUNTER — Other Ambulatory Visit: Payer: Self-pay

## 2011-10-14 ENCOUNTER — Other Ambulatory Visit (INDEPENDENT_AMBULATORY_CARE_PROVIDER_SITE_OTHER): Payer: Managed Care, Other (non HMO)

## 2011-10-14 DIAGNOSIS — R300 Dysuria: Secondary | ICD-10-CM

## 2011-10-14 LAB — URINALYSIS, ROUTINE W REFLEX MICROSCOPIC
Bilirubin Urine: NEGATIVE
Ketones, ur: NEGATIVE
Total Protein, Urine: NEGATIVE
Urine Glucose: NEGATIVE
Urobilinogen, UA: 0.2 (ref 0.0–1.0)

## 2011-10-14 MED ORDER — CORAL CALCIUM 1000 (390 CA) MG PO TABS: 1000.0000 [IU] | ORAL_TABLET | Freq: Every day | ORAL | Status: DC

## 2011-10-14 MED ORDER — SULFAMETHOXAZOLE-TRIMETHOPRIM 800-160 MG PO TABS: 1.0000 | ORAL_TABLET | Freq: Two times a day (BID) | ORAL | Status: AC

## 2011-10-14 NOTE — Telephone Encounter (Signed)
Pt came in req lab order for urine test due to the pain when urinate. Pt is having an appt today 10/14/11 at 4:15 and pt already left sample of urine down in the lab. Please call pt once the test is order.

## 2011-10-14 NOTE — Telephone Encounter (Signed)
Appt canceled with VAL, UA ordered by MEN, pt advised of same.

## 2011-10-14 NOTE — Telephone Encounter (Signed)
U/A positive. Left msg for pt on home phone. Rx sent in for septra DS bid x 5

## 2011-10-14 NOTE — Telephone Encounter (Signed)
IF pt does not have appt with me, i will defer mgmt to her PCP

## 2011-10-14 NOTE — Telephone Encounter (Signed)
Pt called back stating that she would prefer to have UA only to avoid making a return trip for acute appt with VAL this afternoon. Please advise on order.

## 2011-10-16 ENCOUNTER — Telehealth: Payer: Self-pay | Admitting: *Deleted

## 2011-10-16 NOTE — Telephone Encounter (Signed)
Returned call to patient on all phone # in chart. Patient ask about lab results. Left message on voice mail that Rx called to pharmacy for  UTI

## 2012-01-07 ENCOUNTER — Encounter: Payer: Self-pay | Admitting: Internal Medicine

## 2012-02-01 ENCOUNTER — Telehealth: Payer: Self-pay | Admitting: Internal Medicine

## 2012-02-01 NOTE — Telephone Encounter (Signed)
Always glad to accomodate if there is an opening

## 2012-02-01 NOTE — Telephone Encounter (Signed)
The patient called and is hoping to be worked in for a cpe this week.  I explained there were no regular openings...but I would double check if you want her worked in.    Thanks!

## 2012-02-04 ENCOUNTER — Telehealth: Payer: Self-pay | Admitting: Internal Medicine

## 2012-02-04 DIAGNOSIS — Z0000 Encounter for general adult medical examination without abnormal findings: Secondary | ICD-10-CM

## 2012-02-04 DIAGNOSIS — R252 Cramp and spasm: Secondary | ICD-10-CM

## 2012-02-04 NOTE — Telephone Encounter (Signed)
Labs entered: CBC, Lipid, Bmet

## 2012-02-04 NOTE — Telephone Encounter (Signed)
The patient is hoping to get her labs done prior to her cpe apt on the 29th.   I'll be happy to call the patient back to let her know her labs are ordered if you want this done. Thanks!

## 2012-02-11 ENCOUNTER — Other Ambulatory Visit (INDEPENDENT_AMBULATORY_CARE_PROVIDER_SITE_OTHER): Payer: Managed Care, Other (non HMO)

## 2012-02-11 DIAGNOSIS — R252 Cramp and spasm: Secondary | ICD-10-CM

## 2012-02-11 DIAGNOSIS — Z0000 Encounter for general adult medical examination without abnormal findings: Secondary | ICD-10-CM

## 2012-02-11 LAB — COMPREHENSIVE METABOLIC PANEL
AST: 22 U/L (ref 0–37)
Albumin: 3.7 g/dL (ref 3.5–5.2)
Alkaline Phosphatase: 62 U/L (ref 39–117)
BUN: 13 mg/dL (ref 6–23)
Calcium: 9.2 mg/dL (ref 8.4–10.5)
Creatinine, Ser: 0.7 mg/dL (ref 0.4–1.2)
Glucose, Bld: 83 mg/dL (ref 70–99)
Potassium: 4 mEq/L (ref 3.5–5.1)

## 2012-02-11 LAB — CBC WITH DIFFERENTIAL/PLATELET
Basophils Relative: 0.5 % (ref 0.0–3.0)
Eosinophils Relative: 3 % (ref 0.0–5.0)
Hemoglobin: 12.9 g/dL (ref 12.0–15.0)
Lymphocytes Relative: 49.5 % — ABNORMAL HIGH (ref 12.0–46.0)
MCHC: 33.1 g/dL (ref 30.0–36.0)
MCV: 96.4 fl (ref 78.0–100.0)
Neutro Abs: 2.1 10*3/uL (ref 1.4–7.7)
Neutrophils Relative %: 40.7 % — ABNORMAL LOW (ref 43.0–77.0)
RBC: 4.05 Mil/uL (ref 3.87–5.11)
WBC: 5.2 10*3/uL (ref 4.5–10.5)

## 2012-02-11 LAB — LIPID PANEL
Cholesterol: 192 mg/dL (ref 0–200)
LDL Cholesterol: 115 mg/dL — ABNORMAL HIGH (ref 0–99)
Triglycerides: 30 mg/dL (ref 0.0–149.0)
VLDL: 6 mg/dL (ref 0.0–40.0)

## 2012-02-16 ENCOUNTER — Ambulatory Visit (INDEPENDENT_AMBULATORY_CARE_PROVIDER_SITE_OTHER): Payer: Managed Care, Other (non HMO) | Admitting: Internal Medicine

## 2012-02-16 ENCOUNTER — Encounter: Payer: Self-pay | Admitting: Internal Medicine

## 2012-02-16 VITALS — BP 104/68 | HR 62 | Temp 97.8°F | Resp 16 | Wt 148.0 lb

## 2012-02-16 DIAGNOSIS — R351 Nocturia: Secondary | ICD-10-CM

## 2012-02-16 DIAGNOSIS — Z91038 Other insect allergy status: Secondary | ICD-10-CM

## 2012-02-16 DIAGNOSIS — R252 Cramp and spasm: Secondary | ICD-10-CM

## 2012-02-16 DIAGNOSIS — F418 Other specified anxiety disorders: Secondary | ICD-10-CM

## 2012-02-16 DIAGNOSIS — Z8679 Personal history of other diseases of the circulatory system: Secondary | ICD-10-CM

## 2012-02-16 DIAGNOSIS — M549 Dorsalgia, unspecified: Secondary | ICD-10-CM

## 2012-02-16 DIAGNOSIS — F341 Dysthymic disorder: Secondary | ICD-10-CM

## 2012-02-16 DIAGNOSIS — Z91030 Bee allergy status: Secondary | ICD-10-CM

## 2012-02-16 DIAGNOSIS — Z0000 Encounter for general adult medical examination without abnormal findings: Secondary | ICD-10-CM

## 2012-02-16 NOTE — Assessment & Plan Note (Signed)
Very tough year with her son having a difficult divorce that included criminal charges - later dismissed. She had marked anxiety and depression now much improved. She admits to bouts of anxiety and occasional panic but is a high functioning woman and work diverts her.   Plan Asked her to consider short-term, problem oriented, insight developing counseling. She is not receptive but may give this some consideration.

## 2012-02-16 NOTE — Assessment & Plan Note (Signed)
She has had severe reaction to be sting. She does carry an epipen and is going to allergist for desensitization.

## 2012-02-16 NOTE — Assessment & Plan Note (Signed)
Well controlled with CCB and being caffeine free

## 2012-02-16 NOTE — Assessment & Plan Note (Signed)
Interval medical history is significant for anxiety and depression. Limited physical exam is normal. Lab results are in normal range. She is current with her gyn. She is current with colorectal and breast cancer screening. Immunizations: no record of tetanus and pneumonia vaccine - she is due for both.   In summary - a delightful woman who is medically stable and does take very good care of herself physically. She will return in 1 year or sooner as needed.

## 2012-02-16 NOTE — Assessment & Plan Note (Signed)
Long standing problem but does not limit her activities: dancing, skiing, biking and hiking.  Plan  Agree with individual Yoga training specifically for her back problems.

## 2012-02-16 NOTE — Assessment & Plan Note (Signed)
Still having nocturia 3-4, associated with hot flashes. This does not disrupt her sleep.
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
## 2012-02-16 NOTE — Assessment & Plan Note (Signed)
Very much improved.

## 2012-02-16 NOTE — Patient Instructions (Addendum)
Thanks for coming to see me.   Exam is normal and your lab work is great.  You may have a little hyperacidity causing the epigastric discomfort. Try the prilosec every AM for several days. If this helps relieve epigastric pain then take an H2 blocker like Zantac (ranitidine) 150 mg twice a day.   For long term management of anxiety and occasional panic think about the idea of counseling. Ask Festus Aloe her thoughts on this. With an open mind to therapy it can be very productive.

## 2012-04-29 ENCOUNTER — Other Ambulatory Visit: Payer: Self-pay | Admitting: Internal Medicine

## 2012-04-29 ENCOUNTER — Other Ambulatory Visit: Payer: Managed Care, Other (non HMO)

## 2012-04-29 DIAGNOSIS — T7840XA Allergy, unspecified, initial encounter: Secondary | ICD-10-CM

## 2012-05-16 ENCOUNTER — Encounter: Payer: Self-pay | Admitting: Internal Medicine

## 2012-05-16 ENCOUNTER — Ambulatory Visit (INDEPENDENT_AMBULATORY_CARE_PROVIDER_SITE_OTHER): Payer: Managed Care, Other (non HMO) | Admitting: Internal Medicine

## 2012-05-16 VITALS — BP 118/70 | HR 75 | Temp 97.0°F | Resp 10 | Wt 147.0 lb

## 2012-05-16 DIAGNOSIS — S335XXA Sprain of ligaments of lumbar spine, initial encounter: Secondary | ICD-10-CM

## 2012-05-16 DIAGNOSIS — S39012A Strain of muscle, fascia and tendon of lower back, initial encounter: Secondary | ICD-10-CM

## 2012-05-16 MED ORDER — CYCLOBENZAPRINE HCL 5 MG PO TABS: 5.0000 mg | ORAL_TABLET | Freq: Three times a day (TID) | ORAL | Status: DC | PRN

## 2012-05-16 MED ORDER — LORAZEPAM 0.5 MG PO TABS: 0.5000 mg | ORAL_TABLET | Freq: Two times a day (BID) | ORAL | Status: DC | PRN

## 2012-05-16 MED ORDER — MELOXICAM 15 MG PO TABS: 15.0000 mg | ORAL_TABLET | Freq: Every day | ORAL | Status: DC

## 2012-05-16 NOTE — Progress Notes (Signed)
  Subjective:    Patient ID: Kristin Torres, female    DOB: 10/25/1950, 62 y.o.   MRN: [id number]  HPI [name] presents acutely for severe back pain such that she cannot walk. No paresthesia, no focal weakness, no incontinence. Still with a lot of pain. Has been taking tramadol 100 mg three times a day (husband's medication).   PMH, FamHx and SocHx reviewed for any changes and relevance. Current Outpatient Prescriptions on File Prior to Visit  Medication Sig Dispense Refill  . Coral Calcium 1000 (390 CA) MG TABS Take 1,000 Units by mouth daily.  30 each  0  . diltiazem (CARDIZEM CD) 120 MG 24 hr capsule TAKE 1 CAPSULE DAILY  90 capsule  3  . fexofenadine (ALLEGRA) 180 MG tablet Take 180 mg by mouth daily as needed. allergies       . escitalopram (LEXAPRO) 10 MG tablet Take 1 tablet (10 mg total) by mouth daily.  30 tablet  5  . meloxicam (MOBIC) 15 MG tablet Take 1 tablet by mouth as needed. pain      . peg 3350 powder (MOVIPREP) 100 G SOLR MOVI PREP take as directed  1 kit  0  . sertraline (ZOLOFT) 50 MG tablet Take 1 tablet (50 mg total) by mouth daily.  30 tablet  2      Review of Systems System review is negative for any constitutional, cardiac, pulmonary, GI or neuro symptoms or complaints other than as described in the HPI.     Objective:   Physical Exam Filed Vitals:   05/16/12 1114  BP: 118/70  Pulse: 75  Temp: 97 F (36.1 C)  Resp: 10   Cor- RRR Pulm - normal respirations Back exam: normal stand with pain; normal flex to greater than 100 degrees; normal gait-slow; normal toe/heel walk; normal step up to exam table w/ 1+ assist; normal SLR sitting; normal DTRs at the patellar tendons; normal sensation to light touch, pin-prick and deep vibratory stimulus; no  CVA tenderness; able to move supine to sitting witout assistance.        Assessment & Plan:  Low back strain - no evidence radiculopathy - compressed nerve.  Plan - mloxicam 15 mg once a day  Flexeril 5  mg three times a day  Heat - thermaPatch works great  Proper positioning: on the floor with knees bent and supported by bolster or in a recliner type chair with good low back support  If slow to resolve consider Physical therapy, most desirable if this is combined with deep tissue massage.

## 2012-05-16 NOTE — Patient Instructions (Addendum)
Low back strain - no evidence radiculopathy - compressed nerve.  Plan - mloxicam 15 mg once a day  Flexeril 5 mg three times a day  Heat - thermaPatch works great  Proper positioning: on the floor with knees bent and supported by bolster or in a recliner type chair with good low back support  If slow to resolve consider Physical therapy, most desirable if this is combined with deep tissue massage.   Back Pain, Adult Low back pain is very common. About 1 in 5 people have back pain. The cause of low back pain is rarely dangerous. The pain often gets better over time. About half of people with a sudden onset of back pain feel better in just 2 weeks. About 8 in 10 people feel better by 6 weeks.   CAUSES Some common causes of back pain include:  Strain of the muscles or ligaments supporting the spine.   Wear and tear (degeneration) of the spinal discs.   Arthritis.   Direct injury to the back.  DIAGNOSIS Most of the time, the direct cause of low back pain is not known. However, back pain can be treated effectively even when the exact cause of the pain is unknown. Answering your caregiver's questions about your overall health and symptoms is one of the most accurate ways to make sure the cause of your pain is not dangerous. If your caregiver needs more information, he or she may order lab work or imaging tests (X-rays or MRIs). However, even if imaging tests show changes in your back, this usually does not require surgery. HOME CARE INSTRUCTIONS For many people, back pain returns. Since low back pain is rarely dangerous, it is often a condition that people can learn to manage on their own.    Remain active. It is stressful on the back to sit or stand in one place. Do not sit, drive, or stand in one place for more than 30 minutes at a time. Take short walks on level surfaces as soon as pain allows. Try to increase the length of time you walk each day.   Do not stay in bed. Resting more than 1 or  2 days can delay your recovery.   Do not avoid exercise or work. Your body is made to move. It is not dangerous to be active, even though your back may hurt. Your back will likely heal faster if you return to being active before your pain is gone.   Pay attention to your body when you  bend and lift. Many people have less discomfort when lifting if they bend their knees, keep the load close to their bodies, and avoid twisting. Often, the most comfortable positions are those that put less stress on your recovering back.   Find a comfortable position to sleep. Use a firm mattress and lie on your side with your knees slightly bent. If you lie on your back, put a pillow under your knees.   Only take over-the-counter or prescription medicines as directed by your caregiver. Over-the-counter medicines to reduce pain and inflammation are often the most helpful. Your caregiver may prescribe muscle relaxant drugs. These medicines help dull your pain so you can more quickly return to your normal activities and healthy exercise.   Put ice on the injured area.   Put ice in a plastic bag.   Place a towel between your skin and the bag.   Leave the ice on for 15 to 20 minutes, 3 to 4 times  a day for the first 2 to 3 days. After that, ice and heat may be alternated to reduce pain and spasms.   Ask your caregiver about trying back exercises and gentle massage. This may be of some benefit.   Avoid feeling anxious or stressed. Stress increases muscle tension and can worsen back pain. It is important to recognize when you are anxious or stressed and learn ways to manage it. Exercise is a great option.  SEEK MEDICAL CARE IF:  You have pain that is not relieved with rest or medicine.   You have pain that does not improve in 1 week.   You have new symptoms.   You are generally not feeling well.  SEEK IMMEDIATE MEDICAL CARE IF:    You have pain that radiates from your back into your legs.   You develop new  bowel or bladder control problems.   You have unusual weakness or numbness in your arms or legs.   You develop nausea or vomiting.   You develop abdominal pain.   You feel faint.  Document Released: 04/06/2005 Document Revised: 10/06/2011 Document Reviewed: 08/25/2010 [facility] Patient Information 2013 [place], [place].

## 2012-06-17 ENCOUNTER — Encounter: Payer: Self-pay | Admitting: Internal Medicine

## 2012-06-20 ENCOUNTER — Telehealth: Payer: Self-pay | Admitting: Internal Medicine

## 2012-06-20 NOTE — Telephone Encounter (Signed)
Caller: Kristin Torres/Patient; Phone: [phone]; Reason for Call: Patient states she was in the office (02/16/2012)  and discussed with Dr.  Debby Bud Urinary Frequency at night. Reviewed EPIC and it is noted in H&P.   They have been no changes since visit.  She is still waking 3-4 x a dnight.  She states that Dr.  Debby Bud offered her medication to try since it was interupting her sleep.  She declined but has now reconsidered.  She would like to try the medication now.  Please contact patient regarding Urinary medication .  Patient is aware that Office is currently closed for weather and this may require an office visit. Will forward concerns to the physician.

## 2012-06-21 NOTE — Telephone Encounter (Signed)
Pt informed of samples of Vesicare, placed upfront in cabinet.

## 2012-06-21 NOTE — Telephone Encounter (Signed)
Ok for samples of Vesicare 5 mg once a day.

## 2012-07-05 ENCOUNTER — Telehealth: Payer: Self-pay | Admitting: Internal Medicine

## 2012-07-05 MED ORDER — SOLIFENACIN SUCCINATE 10 MG PO TABS: 10.0000 mg | ORAL_TABLET | Freq: Every day | ORAL | Status: DC

## 2012-07-05 NOTE — Telephone Encounter (Signed)
Patient called earlier in March with concerns of Urinary Frequency at night.  Dr. Debby Bud placed her on Vesicare 5mg  tablets. (samples provided).  Patient is calling in today stating that she is having some relief with the Vesicare.  She WAS going 4-5 X times a night .  NOW she is going twice a night.  Questions- (1)  Is this the best it will get?  (#2) Do you increase the dose of Vesicare?  (#3) Is there another medication to try?   (#4) she will need a script sent to her pharmacy.  Patient is traveling today and is on her cell phone  [phone].  Please contact

## 2012-07-05 NOTE — Telephone Encounter (Signed)
1. Glad vesicare is working, May increase to 10 mg once a day. Rx sent to pharmacy.

## 2012-07-05 NOTE — Telephone Encounter (Signed)
Left message for pt to callback office.  

## 2012-07-06 ENCOUNTER — Telehealth: Payer: Self-pay | Admitting: *Deleted

## 2012-07-06 NOTE — Telephone Encounter (Signed)
Called [facility] [phone] to obtain a prior authorization for rx Vesicare 10 mg. Representative stated that pt is required to try a generic formulary for 30 days before being allowed to use a non-formulary medication. Generic brands are: oxybutinin, trospium or trospium xr. Once pt has tried one of these for 30 days and if it is not effective in treating their urinary issues, then we can call back to proceed with prior authorization for the Vesicare. Please advise.

## 2012-07-06 NOTE — Telephone Encounter (Signed)
Pt informed of MD's results and new rx for Vesicare.

## 2012-07-07 ENCOUNTER — Telehealth: Payer: Self-pay | Admitting: Internal Medicine

## 2012-07-07 MED ORDER — TROSPIUM CHLORIDE ER 60 MG PO CP24: ORAL_CAPSULE | ORAL | Status: DC

## 2012-07-07 NOTE — Telephone Encounter (Signed)
Called pt and LVM that Dr Debby Bud was changing rx from diovan to trospium ER 60mg  tablets. Rx sent to pt's pharmacy.

## 2012-07-07 NOTE — Telephone Encounter (Signed)
Caller: Angelyne/Patient; Phone: [phone]; Reason for Call: Pt received sample medication at last office and Pharmacy has called pt and the insurance company is requesting a prior auth for medication.   Pt would like more samples until prior auth can be completed.   Please disregard above message.  Message given to Pt per Epic about change in medication before she can be placed on Vesicare.

## 2012-07-07 NOTE — Telephone Encounter (Signed)
Ok to try trospium ER 60 mg once a day, # 30 refill prn

## 2012-10-11 ENCOUNTER — Other Ambulatory Visit: Payer: Self-pay | Admitting: Internal Medicine

## 2012-11-28 ENCOUNTER — Encounter: Payer: Self-pay | Admitting: Internal Medicine

## 2013-02-14 ENCOUNTER — Other Ambulatory Visit: Payer: Self-pay | Admitting: Internal Medicine

## 2013-02-14 NOTE — Telephone Encounter (Signed)
Prescription called to pharmacy.

## 2013-02-20 ENCOUNTER — Encounter: Payer: Self-pay | Admitting: Internal Medicine

## 2013-02-20 ENCOUNTER — Ambulatory Visit (INDEPENDENT_AMBULATORY_CARE_PROVIDER_SITE_OTHER): Payer: Managed Care, Other (non HMO) | Admitting: Internal Medicine

## 2013-02-20 VITALS — BP 108/72 | HR 78 | Temp 97.9°F | Ht 65.0 in | Wt 147.4 lb

## 2013-02-20 DIAGNOSIS — M549 Dorsalgia, unspecified: Secondary | ICD-10-CM

## 2013-02-20 DIAGNOSIS — Z23 Encounter for immunization: Secondary | ICD-10-CM

## 2013-02-20 DIAGNOSIS — D126 Benign neoplasm of colon, unspecified: Secondary | ICD-10-CM

## 2013-02-20 DIAGNOSIS — F341 Dysthymic disorder: Secondary | ICD-10-CM

## 2013-02-20 DIAGNOSIS — R351 Nocturia: Secondary | ICD-10-CM

## 2013-02-20 DIAGNOSIS — Z91038 Other insect allergy status: Secondary | ICD-10-CM

## 2013-02-20 DIAGNOSIS — Z8679 Personal history of other diseases of the circulatory system: Secondary | ICD-10-CM

## 2013-02-20 DIAGNOSIS — Z0000 Encounter for general adult medical examination without abnormal findings: Secondary | ICD-10-CM

## 2013-02-20 DIAGNOSIS — Z91030 Bee allergy status: Secondary | ICD-10-CM

## 2013-02-20 DIAGNOSIS — F418 Other specified anxiety disorders: Secondary | ICD-10-CM

## 2013-02-20 MED ORDER — EPINEPHRINE 0.3 MG/0.3ML IJ SOAJ: 0.3000 mg | Freq: Once | INTRAMUSCULAR | Status: DC

## 2013-02-20 NOTE — Assessment & Plan Note (Signed)
Refill on epi-pen provided.

## 2013-02-20 NOTE — Assessment & Plan Note (Signed)
Currently doing well with no active complaint of limiting back pain.

## 2013-02-20 NOTE — Assessment & Plan Note (Signed)
Continued nocturia. OAB (Irritable bladder) less likely w/o response to anticholinesterase treatments.  Plan If continue, bothersome problem will refer to Urology for further assessment.

## 2013-02-20 NOTE — Patient Instructions (Signed)
Good to see you. All seems well medically. Please send me a copy of the lab work done at work.  Tdap today and if insurance covers it Zostavax, for which you can return for a nurse visit.  Let me know if there is anything I can do to help with stress. I have requested the paper chart as we discussed.  I have put your name down to transfer to Dr. Felicity Coyer in April '15.

## 2013-02-20 NOTE — Progress Notes (Signed)
Subjective:    Patient ID: Kristin Torres, female    DOB: 1950-06-27, 62 y.o.   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 There are emotional stressors which she is dealing with w/o chronic medications but getting relief as needed from prn lorazepam.   No major illness, no surgery, no injured.  Current with Gyn. Has had eye exam - family h/o glaucoma - in the last 12 months. Has seen dentist in the last 6 months. She is physically active - biking, yoga, skiing.  Past Medical History  Diagnosis Date  . Tubular adenoma of colon   . SVT (supraventricular tachycardia)   . Cervicalgia   . Chronic back pain   . Personal history of other diseases of circulatory system   . Leg cramps   . Dermatophytosis of nail   . Sinusitis   . History of pneumonia    Past Surgical History  Procedure Laterality Date  . Breast excisional biopsy  2011    benign  right breast  . Polypectomy    . Oophorectomy    . Appendectomy     Family History  Problem Relation Age of Onset  . Stomach cancer Paternal Grandmother    History   Social History  . Marital Status: Married    Spouse Name: N/A    Number of Children: N/A  . Years of Education: N/A   Occupational History  . Not on file.   Social History Main Topics  . Smoking status: Never Smoker   . Smokeless tobacco: Never Used  . Alcohol Use: 3.6 oz/week    6 Glasses of wine per week  . Drug Use: No  . Sexual Activity: Not on file   Other Topics Concern  . 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 Coral Calcium 1000 (390 CA) MG TABS Take 1,000 Units by mouth daily.  30 each  0  . cyclobenzaprine (FLEXERIL) 5 MG tablet Take 1  tablet (5 mg total) by mouth 3 (three) times daily as needed for muscle spasms.  30 tablet  1  . diltiazem (CARDIZEM CD) 120 MG 24 hr capsule TAKE 1 CAPSULE DAILY  90 capsule  3  . fexofenadine (ALLEGRA) 180 MG tablet Take 180 mg by mouth daily as needed. allergies       . LORazepam (ATIVAN) 0.5 MG tablet Take 1 tablet (0.5 mg total) by mouth 2 (two) times daily as needed for anxiety.  30 tablet  1  . zaleplon (SONATA) 10 MG capsule take 1 capsule by mouth at bedtime if needed for sleep  90 capsule  1  . escitalopram (LEXAPRO) 10 MG tablet Take 1 tablet (10 mg total) by mouth daily.  30 tablet  5  . sertraline (ZOLOFT) 50 MG tablet Take 1 tablet (50 mg total) by mouth daily.  30 tablet  2   No current facility-administered medications on file prior to visit.      Review of Systems Constitutional:  Negative for fever, chills, activity change and unexpected weight change.  HEENT:  Negative for hearing loss, ear pain, congestion, neck stiffness and postnasal drip. Negative for  sore throat or swallowing problems. Negative for dental complaints.   Eyes: Negative for vision loss or change in visual acuity.  Respiratory: Negative for chest tightness and wheezing. Negative for DOE.   Cardiovascular: Negative for chest pain or palpitations. No decreased exercise tolerance Gastrointestinal: No change in bowel habit. No bloating or gas. No reflux or indigestion Genitourinary: Negative for urgency, frequency, flank pain and difficulty urinating.  Musculoskeletal: Negative for myalgias, back pain, arthralgias and gait problem.  Neurological: Negative for dizziness, tremors, weakness and headaches.  Hematological: Negative for adenopathy.  Psychiatric/Behavioral: Negative for behavioral problems and dysphoric mood. Positve for stress and anxiety      Objective:   Physical Exam Filed Vitals:   02/20/13 0907  BP: 108/72  Pulse: 78  Temp: 97.9 F (36.6 C)   Wt Readings from Last 3 Encounters:   02/20/13 147 lb 6.4 oz (66.86 kg)  05/16/12 147 lb (66.679 kg)  02/16/12 148 lb (67.132 kg)   Gen'l: well nourished, well developed Woman in no distress HEENT - [place]/AT, EACs/TMs normal, oropharynx with native dentition in good condition, no buccal or palatal lesions, posterior pharynx clear, mucous membranes moist. C&S clear, PERRLA, fundi - normal Neck - supple, no thyromegaly Nodes- negative submental, cervical, supraclavicular regions Chest - no deformity, no CVAT Lungs - clear without rales, wheezes. No increased work of breathing Breast - deferred to mammography and Gyn exam Cardiovascular - regular rate and rhythm, quiet precordium, no murmurs, rubs or gallops, 2+ radial, DP and PT pulses Abdomen - BS+ x 4, no HSM, no guarding or rebound or tenderness Pelvic - deferred to gyn Rectal - deferred to gyn Extremities - no clubbing, cyanosis, edema or deformity.  Neuro - A&O x 3, CN II-XII normal, motor strength normal and equal, DTRs 2+ and symmetrical biceps, radial, and patellar tendons. Cerebellar - no tremor, no rigidity, fluid movement and normal gait. Derm - Head, neck, back, abdomen and extremities without suspicious lesions  Labs done as part of work related screening are requested and pending        Assessment & Plan:

## 2013-02-20 NOTE — Assessment & Plan Note (Signed)
Interval history w/o serious medical illness, injury or surgery. Limited physical exam is normal. Outside lab results requested. Per last colonoscopy report '07 she is due for follow up study (notified by MyChart message). She is current with breast cancer screening and she is current with Gyn. Immunizations - Tdap today; will research coverage for shingles vaccine.  In summary   very pleasant woman who appears to be medically stable. She will return as needed.

## 2013-02-20 NOTE — Assessment & Plan Note (Signed)
A variety of stressor. Diversion with work and activity is helpful.  Plan Continue with prn Lorazepam  For increasing symptomatology suggest short term counseling and possibility SSRI

## 2013-02-20 NOTE — Assessment & Plan Note (Signed)
Continues on diltiazem ER - good control of tachycardia.  Plan Continue present regimen. BP is at the low end of normal but she remains asymptomatic,

## 2013-02-20 NOTE — Assessment & Plan Note (Signed)
Patient with tubular adenoma in '04. Follow up in '07 negative.   PLan Due for f/u colonoscopy - MyChart message sent to patient

## 2013-02-23 ENCOUNTER — Other Ambulatory Visit: Payer: Self-pay

## 2013-03-01 ENCOUNTER — Ambulatory Visit (INDEPENDENT_AMBULATORY_CARE_PROVIDER_SITE_OTHER): Payer: Managed Care, Other (non HMO)

## 2013-03-01 DIAGNOSIS — Z2911 Encounter for prophylactic immunotherapy for respiratory syncytial virus (RSV): Secondary | ICD-10-CM

## 2013-03-01 DIAGNOSIS — Z23 Encounter for immunization: Secondary | ICD-10-CM

## 2013-06-08 ENCOUNTER — Encounter: Payer: Self-pay | Admitting: Internal Medicine

## 2013-08-07 ENCOUNTER — Telehealth: Payer: Self-pay | Admitting: *Deleted

## 2013-08-21 ENCOUNTER — Telehealth: Payer: Self-pay | Admitting: Internal Medicine

## 2013-08-21 NOTE — Telephone Encounter (Signed)
Patient states she is having RUQ pain. This became much worse after eating foods with "fat". States the pain is sharp and if she sleeps on the left side it is some better. Scheduled with Dr. Olevia Perches on 08/22/13 at 3:00 PM.

## 2013-08-22 ENCOUNTER — Encounter: Payer: Self-pay | Admitting: Internal Medicine

## 2013-08-22 ENCOUNTER — Ambulatory Visit (INDEPENDENT_AMBULATORY_CARE_PROVIDER_SITE_OTHER): Payer: Managed Care, Other (non HMO) | Admitting: Internal Medicine

## 2013-08-22 VITALS — BP 104/70 | HR 64 | Ht 64.5 in | Wt 149.0 lb

## 2013-08-22 DIAGNOSIS — R1012 Left upper quadrant pain: Secondary | ICD-10-CM

## 2013-08-22 NOTE — Patient Instructions (Signed)
Dr Norins 

## 2013-08-22 NOTE — Progress Notes (Signed)
[place] Nov 20, 1950 [id number]  Note: This dictation was prepared with Dragon digital system. Any transcriptional errors that result from this procedure are unintentional.   History of Present Illness:  This is a 63 year old white female with sudden onset of pain along the left costal margin which started several days ago rather suddenly but  has improved since.. Initially, it was quite severe and she had trouble sleeping on that side. It was worse with inspiration and also worse with trying to sit up or lay down. She blames it on eating a large amount of Ravioli THE day before. There was however no nausea, vomiting, diarrhea or any other digestive symptoms. The pain did not radiate to the costovertebral angle. We saw her in August 2007 for colonoscopy which was normal. She had an adenomatous polyp in 2004.    Past Medical History  Diagnosis Date  . Tubular adenoma of colon   . SVT (supraventricular tachycardia)   . Cervicalgia   . Chronic back pain   . Personal history of other diseases of circulatory system   . Leg cramps   . Dermatophytosis of nail   . Sinusitis   . History of pneumonia     Past Surgical History  Procedure Laterality Date  . Breast excisional biopsy Right 2011    benign  right breast  . Polypectomy    . Oophorectomy    . Appendectomy      Allergies  Allergen Reactions  . Bee Venom   . Penicillins   . Streptomycin     Family history and social history have been reviewed.  Review of Systems: Denies heartburn nausea vomiting  The remainder of the 10 point ROS is negative except as outlined in the H&P  Physical Exam: General Appearance Well developed, in no distress Eyes  Non icteric  HEENT  Non traumatic, normocephalic  Mouth No lesion, tongue papillated, no cheilosis Neck Supple without adenopathy, thyroid not enlarged, no carotid bruits, no JVD Lungs Clear to auscultation bilaterally COR Normal S1, normal S2, regular rhythm, no murmur,  quiet precordium Abdomen tender over test we'll margin. Specifically the last 2 weeks are very tender on at costochondral junctions. Bowel sounds are active. There is no distention or ascites. Lower abdomen is unremarkable. Sitting up precipitates the pain, straight leg raising is negative Rectal not done Extremities  No pedal edema Skin No lesions Neurological Alert and oriented x 3 Psychological Normal mood and affect  Assessment and Plan:  Problem #1 Pain of the left costal margin which is consistent with costochondritis. Or MS pain. She will continue to take Advil twice a day. I offered a Medrol pack but she would rather stay with Advil since the pain has been getting better. We are not sure of the precipitating factor. She is very active physically and she might have pulled a muscle during exercise. She would also use local heat. He will call us the next 2 weeks if the symptoms don't improve.    [place] Dragon 08/22/2013

## 2013-09-01 NOTE — Telephone Encounter (Signed)
Patient called on 08/10/13 by Ernestine Conrad and given lab results verified by Dr Amalia Hailey

## 2013-11-07 ENCOUNTER — Other Ambulatory Visit: Payer: Self-pay

## 2013-11-07 MED ORDER — DILTIAZEM HCL ER COATED BEADS 120 MG PO CP24: ORAL_CAPSULE | ORAL | Status: DC

## 2013-11-07 NOTE — Telephone Encounter (Signed)
OK #30 Needs OV before refills

## 2013-11-13 ENCOUNTER — Other Ambulatory Visit: Payer: Self-pay

## 2013-12-04 ENCOUNTER — Encounter: Payer: Self-pay | Admitting: Internal Medicine

## 2013-12-04 ENCOUNTER — Other Ambulatory Visit: Payer: Managed Care, Other (non HMO)

## 2013-12-04 ENCOUNTER — Other Ambulatory Visit (INDEPENDENT_AMBULATORY_CARE_PROVIDER_SITE_OTHER): Payer: Managed Care, Other (non HMO)

## 2013-12-04 ENCOUNTER — Ambulatory Visit (INDEPENDENT_AMBULATORY_CARE_PROVIDER_SITE_OTHER): Payer: Managed Care, Other (non HMO) | Admitting: Internal Medicine

## 2013-12-04 VITALS — BP 120/68 | HR 74 | Temp 98.0°F | Wt 149.0 lb

## 2013-12-04 DIAGNOSIS — R350 Frequency of micturition: Secondary | ICD-10-CM

## 2013-12-04 DIAGNOSIS — R82998 Other abnormal findings in urine: Secondary | ICD-10-CM

## 2013-12-04 DIAGNOSIS — R8290 Unspecified abnormal findings in urine: Secondary | ICD-10-CM

## 2013-12-04 DIAGNOSIS — R351 Nocturia: Secondary | ICD-10-CM

## 2013-12-04 DIAGNOSIS — N951 Menopausal and female climacteric states: Secondary | ICD-10-CM

## 2013-12-04 LAB — POCT URINALYSIS DIPSTICK
BILIRUBIN UA: NEGATIVE
Blood, UA: NEGATIVE
Glucose, UA: NEGATIVE
KETONES UA: NEGATIVE
NITRITE UA: NEGATIVE
Spec Grav, UA: 1.015
Urobilinogen, UA: 0.2
pH, UA: 5

## 2013-12-04 LAB — BASIC METABOLIC PANEL
BUN: 28 mg/dL — ABNORMAL HIGH (ref 6–23)
CHLORIDE: 102 meq/L (ref 96–112)
CO2: 29 mEq/L (ref 19–32)
CREATININE: 0.8 mg/dL (ref 0.4–1.2)
Calcium: 9.4 mg/dL (ref 8.4–10.5)
GFR: 83.49 mL/min (ref >60.00)
Glucose, Bld: 88 mg/dL (ref 70–99)
POTASSIUM: 4.4 meq/L (ref 3.5–5.1)
Sodium: 138 mEq/L (ref 135–145)

## 2013-12-04 MED ORDER — CLONIDINE HCL 0.1 MG PO TABS: ORAL_TABLET | ORAL | Status: DC

## 2013-12-04 NOTE — Progress Notes (Signed)
   Subjective:    Patient ID: Kristin Torres, female    DOB: 01-18-1951, 63 y.o.   MRN: [id number]  HPI    She describes nocturia 4-5 times per night which has been present for @ least 2 years. She has been treated with VESIcare and Santura without benefit.  She believes the symptoms are related to her menopausal hot flashes. She has seen her gynecologist who found no gynecologic pathology as an etiologic factor.  She has also tried to decrease fluids after the evening meal without benefit  She has no daytime symptoms.  She had been bicycling on a camping trip and questions whether that might have played a role in the George found that the gynecologist.   On that camping trip and while at the beach; the nocturia was dramatically improved.    Review of Systems Dysuria, pyuria, hematuria, frequency,  or polyuria are denied.  She denies any significant sleep symptoms of apnea. She states her husband does say she snores.  She uses an oral prosthesis because of grinding her teeth.     Objective:   Physical Exam   Positive or pertinent physical findings include: There is some crowding of the oropharynx without airway compromise.  General appearance :adequately nourished; in no distress. Eyes: No conjunctival inflammation or scleral icterus is present. Oral exam: Dental hygiene is good. Lips and gums are healthy appearing.There is no oropharyngeal erythema or exudate noted.  Heart:  Normal rate and regular rhythm. S1 and S2 normal without gallop, murmur, click, rub or other extra sounds   Lungs:Chest clear to auscultation; no wheezes, rhonchi,rales ,or rubs present.No increased work of breathing.  Abdomen: bowel sounds normal, soft and non-tender without masses, organomegaly or hernias noted.  No guarding or rebound. No flank tenderness to percussion. Skin:Warm & dry.  Intact without suspicious lesions or rashes ; no jaundice or tenting Lymphatic: No lymphadenopathy is noted about  the head, neck, axilla           Assessment & Plan:  #1 nocturia, present several years  #2 menopausal symptoms  #3 abnormal urine; culture pending  Plan: Various options were discussed with her. It was recommended that she keep a nocturia diary.  A trial of Clonidine at bedtime at low dose was recommended to treat hot flashes and possibly the nocturia.  If symptoms fail to respond; urologic consultation is recommended. If that were non-revealing; she should be evaluated for  Underlying sleep disorder.

## 2013-12-04 NOTE — Assessment & Plan Note (Signed)
BMET  ddAVP would be deferred to Urology

## 2013-12-04 NOTE — Patient Instructions (Signed)
Your next office appointment will be determined based upon review of your pending labs. Those instructions will be transmitted to you through My Chart . 

## 2013-12-04 NOTE — Progress Notes (Signed)
Pre visit review using our clinic review tool, if applicable. No additional management support is needed unless otherwise documented below in the visit note. 

## 2013-12-05 ENCOUNTER — Telehealth: Payer: Self-pay | Admitting: *Deleted

## 2013-12-05 DIAGNOSIS — N951 Menopausal and female climacteric states: Secondary | ICD-10-CM

## 2013-12-05 DIAGNOSIS — R351 Nocturia: Secondary | ICD-10-CM

## 2013-12-05 LAB — URINE CULTURE

## 2013-12-05 MED ORDER — CLONIDINE HCL 0.1 MG PO TABS: 0.1000 mg | ORAL_TABLET | Freq: Every day | ORAL | Status: DC

## 2013-12-05 NOTE — Telephone Encounter (Signed)
Pt states md gave rx fro her clonidine, requesting med to be sent electronically to [facility]....lmb

## 2013-12-10 ENCOUNTER — Encounter: Payer: Self-pay | Admitting: Internal Medicine

## 2013-12-11 ENCOUNTER — Other Ambulatory Visit: Payer: Self-pay

## 2013-12-11 MED ORDER — DILTIAZEM HCL ER COATED BEADS 120 MG PO CP24: ORAL_CAPSULE | ORAL | Status: DC

## 2014-01-15 ENCOUNTER — Telehealth: Payer: Self-pay | Admitting: Internal Medicine

## 2014-01-15 NOTE — Telephone Encounter (Signed)
Printed med list. Notified pt ready for pick-up,,,/lmb

## 2014-01-15 NOTE — Telephone Encounter (Signed)
Patient states she needs a formal document listing her medications that is signed by her doctor. She is leaving for [place] on Thursday.

## 2014-01-15 NOTE — Telephone Encounter (Signed)
Please print out med list & I'll sign

## 2014-01-16 ENCOUNTER — Telehealth: Payer: Self-pay | Admitting: Internal Medicine

## 2014-01-16 NOTE — Telephone Encounter (Signed)
Patient needs letter by tomorrow

## 2014-01-16 NOTE — Telephone Encounter (Signed)
Patient is going out of the country.  She states Dr. Linna Darner gave her a print out of her meds with a card to take with her.  She states she needs a letter stating physicians lic number, stamp, signature, and notarized signature.  Please advise.

## 2014-01-16 NOTE — Telephone Encounter (Signed)
Please get copy of my CV from Administration & I'll sign in front of Notary

## 2014-01-17 NOTE — Telephone Encounter (Signed)
Spoke with [employer].  All we can do is list her current meds on letterhead with  physician signature.  Patient needs this in the morning bc she is flying out tomorrow evening.  Can you please process this?

## 2014-01-17 NOTE — Telephone Encounter (Signed)
Josh, can you help with this?

## 2014-01-18 ENCOUNTER — Encounter: Payer: Self-pay | Admitting: *Deleted

## 2014-01-29 ENCOUNTER — Telehealth: Payer: Self-pay | Admitting: Internal Medicine

## 2014-01-29 NOTE — Telephone Encounter (Signed)
Ok 1 pm tomorrow Thx

## 2014-01-29 NOTE — Telephone Encounter (Signed)
appt is set for 1pm, tomorrow

## 2014-01-29 NOTE — Telephone Encounter (Signed)
Pt request to be work in, pt was in the hospital for heart condition (SVT) in another country and the doctor recommend for her to see her doctor ASAP. Dr. Camila Li is book until 02/13/14. Please advise, pt request for [place] to ask.

## 2014-01-30 ENCOUNTER — Encounter: Payer: Self-pay | Admitting: Internal Medicine

## 2014-01-30 ENCOUNTER — Ambulatory Visit (INDEPENDENT_AMBULATORY_CARE_PROVIDER_SITE_OTHER): Payer: Managed Care, Other (non HMO) | Admitting: Internal Medicine

## 2014-01-30 VITALS — BP 128/74 | HR 80 | Temp 97.9°F | Resp 16 | Wt 148.0 lb

## 2014-01-30 DIAGNOSIS — I471 Supraventricular tachycardia: Secondary | ICD-10-CM

## 2014-01-30 NOTE — Assessment & Plan Note (Signed)
Continue with current prescription therapy as reflected on the Med list.  

## 2014-01-30 NOTE — Patient Instructions (Signed)
Take Lorazepam as needed

## 2014-01-30 NOTE — Progress Notes (Signed)
   Subjective:     HPI  C/o SVT episode Oct 2-3, 2015 in [place] (4 hrs)- was hospitalized; elevated enzymes.  She had a heart cath, ECHO. She is going to [facility]. She as put on Amiodarone 200 mg/d.  SBP was 40 on admission   Review of Systems  Constitutional: Negative for chills, activity change, appetite change, fatigue and unexpected weight change.  HENT: Negative for congestion, mouth sores and sinus pressure.   Eyes: Negative for visual disturbance.  Respiratory: Negative for apnea, cough, chest tightness, shortness of breath and wheezing.   Cardiovascular: Negative for leg swelling.  Gastrointestinal: Negative for nausea and abdominal pain.  Genitourinary: Negative for frequency, difficulty urinating and vaginal pain.  Musculoskeletal: Negative for back pain and gait problem.  Skin: Negative for pallor and rash.  Neurological: Negative for dizziness, tremors, weakness, numbness and headaches.  Psychiatric/Behavioral: Negative for suicidal ideas, confusion and sleep disturbance.       Objective:   Physical Exam  Constitutional: She appears well-developed. No distress.  HENT:  Head: Normocephalic.  Right Ear: External ear normal.  Left Ear: External ear normal.  Nose: Nose normal.  Mouth/Throat: Oropharynx is clear and moist.  Eyes: Conjunctivae are normal. Pupils are equal, round, and reactive to light. Right eye exhibits no discharge. Left eye exhibits no discharge.  Neck: Normal range of motion. Neck supple. No JVD present. No tracheal deviation present. No thyromegaly present.  Cardiovascular: Normal rate, regular rhythm and normal heart sounds.   Pulmonary/Chest: No stridor. No respiratory distress. She has no wheezes.  Abdominal: Soft. Bowel sounds are normal. She exhibits no distension and no mass. There is no tenderness. There is no rebound and no guarding.  Musculoskeletal: She exhibits no edema and no tenderness.  Lymphadenopathy:    She has no cervical  adenopathy.  Neurological: She displays normal reflexes. No cranial nerve deficit. She exhibits normal muscle tone. Coordination normal.  Skin: No rash noted. No erythema.  Psychiatric: She has a normal mood and affect. Her behavior is normal. Judgment and thought content normal.    Labs, EKG Heart Cath ok      Assessment & Plan:

## 2014-01-30 NOTE — Progress Notes (Signed)
Pre visit review using our clinic review tool, if applicable. No additional management support is needed unless otherwise documented below in the visit note. 

## 2014-01-31 DIAGNOSIS — I471 Supraventricular tachycardia: Secondary | ICD-10-CM | POA: Insufficient documentation

## 2014-01-31 DIAGNOSIS — I214 Non-ST elevation (NSTEMI) myocardial infarction: Secondary | ICD-10-CM | POA: Insufficient documentation

## 2014-01-31 DIAGNOSIS — I4719 Other supraventricular tachycardia: Secondary | ICD-10-CM | POA: Insufficient documentation

## 2014-02-08 ENCOUNTER — Ambulatory Visit (INDEPENDENT_AMBULATORY_CARE_PROVIDER_SITE_OTHER): Payer: Managed Care, Other (non HMO) | Admitting: Internal Medicine

## 2014-02-08 ENCOUNTER — Encounter: Payer: Self-pay | Admitting: Internal Medicine

## 2014-02-08 VITALS — BP 132/80 | HR 68 | Ht 65.5 in | Wt 147.8 lb

## 2014-02-08 DIAGNOSIS — I471 Supraventricular tachycardia: Secondary | ICD-10-CM

## 2014-02-08 NOTE — Progress Notes (Signed)
HPI Dr. Luiz Iron is referred today for evaluation of SVT. 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 Approximately 9 years ago, the patient developed tachycardia palpitations and was diagnosed with SVT. She was treated with intravenous adenosine, restoring sinus rhythm. She has had 2 additional episodes of sustained SVT, one occurring while flying overseas where she was out of rhythm for many hours. She appears to have ruled in for non-ST elevation MI, and underwent catheterization which demonstrated no obstructive coronary disease. She has preserved left ventricular function. The patient thinks that her episodes of SVT have been related to caffeine. While in [place], she was treated with amiodarone which she still takes. The patient has been seen at [facility] but comes in today for second opinion. Her episodes start and stop suddenly. She has tried vagal maneuvers, these have been unsuccessful. She will at times have brief episodes of SVT which terminated spontaneously. She does not feel well on amiodarone. Allergies  Allergen Reactions  . Bee Venom Anaphylaxis  . Streptomycin Rash  . Penicillins Rash     Current Outpatient Prescriptions  Medication Sig Dispense Refill  . amiodarone (CORDARONE) 200 MG tablet Take 200 mg by mouth daily.      Marland Kitchen aspirin EC 81 MG tablet Take 1 tablet by mouth daily.      . cholecalciferol (VITAMIN D) 1000 UNITS tablet Take 1,000 Units by mouth daily.      Marland Kitchen EPINEPHrine (EPIPEN) 0.3 mg/0.3 mL SOAJ injection Inject 0.3 mLs (0.3 mg total) into the muscle once.  2 Device  2  . fexofenadine (ALLEGRA) 180 MG tablet Take 180 mg by mouth daily as needed. allergies       . LORazepam (ATIVAN) 0.5 MG tablet Take 1 tablet (0.5 mg total) by mouth 2 (two)  times daily as needed for anxiety.  30 tablet  1  . zaleplon (SONATA) 10 MG capsule take 1 capsule by mouth at bedtime if needed for sleep  90 capsule  1   No current facility-administered medications for this visit.     Past Medical History  Diagnosis Date  . Tubular adenoma of colon   . SVT (supraventricular tachycardia)   . Cervicalgia   . Chronic back pain   . Personal history of other diseases of circulatory system   . Leg cramps   . Dermatophytosis of nail   . Sinusitis   . History of pneumonia   . MI (myocardial infarction)   . PSVT (paroxysmal supraventricular tachycardia)   . Nocturia     ROS:   All systems reviewed and negative except as noted in the HPI.   Past Surgical History  Procedure Laterality Date  . Breast excisional biopsy Right 2011    benign  right breast  . Polypectomy    . Oophorectomy    . Appendectomy       Family History  Problem Relation Age of Onset  . Stomach cancer Paternal Grandmother   . Breast cancer Maternal Grandmother   . Diabetes Maternal Grandmother   . Diabetes Mother   . Heart attack Father 26  . Hypertension Mother      History   Social History  . Marital Status: Married    Spouse Name: N/A    Number of Children:  2  . Years of Education: N/A   Occupational History  . microbiologist    Social History Main Topics  . Smoking status: Never Smoker   . Smokeless tobacco: Never Used  . Alcohol Use: 3.6 oz/week    6 Glasses of wine per week  . Drug Use: No  . Sexual Activity: Not on file   Other Topics Concern  . 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 HEART:  Regular rate rhythm, no murmurs, no rubs, no clicks Abd:  soft, positive bowel sounds, no organomegally, no rebound, no guarding Ext:  2 plus pulses, no edema, no cyanosis, no clubbing Skin:  No rashes no nodules Neuro:  CN II through XII intact, motor grossly intact  EKG - normal sinus rhythm with no ventricular preexcitation. Review of her ECG during tachycardia demonstrates a narrow QRS tachycardia, long RP tachycardia, at rates in the 195 beats per minute   Assess/Plan:

## 2014-02-08 NOTE — Assessment & Plan Note (Addendum)
The patient has had a history of recurrent SVT despite medical therapy. I've discussed the treatment options in detail with the patient and her husband. The risk, goals, benefits, and expectations of catheter ablation have been discussed and she is considering her options, and will call us if she wishes to proceed with catheter ablation. The patient had many questions today and spent nearly an hour with me discussing her SVT and its treatment. I recommended the patient stop taking amiodarone as my experience has been that taking amiodarone may make it difficult to induce SVT in the future.

## 2014-02-08 NOTE — Patient Instructions (Signed)
Your physician has recommended that you have an ablation. Catheter ablation is a medical procedure used to treat some cardiac arrhythmias (irregular heartbeats). During catheter ablation, a long, thin, flexible tube is put into a blood vessel in your groin (upper thigh), or neck. This tube is called an ablation catheter. It is then guided to your heart through the blood vessel. Radio frequency waves destroy small areas of heart tissue where abnormal heartbeats may cause an arrhythmia to start. Please see the instruction sheet given to you today.  Call when to schedule the ablation some dates are 11/11, 11/16, 11/18, 02/2311/2,12/4,12/7,12/912/1112/14,12/16,12/28,12/31

## 2014-02-13 ENCOUNTER — Telehealth: Payer: Self-pay | Admitting: Internal Medicine

## 2014-02-13 NOTE — Telephone Encounter (Signed)
New message      Talk to [place] regarding date for an ablation----she want dates in January.  OK for Claiborne Billings to call tomorrow--she is aware Claiborne Billings is out today

## 2014-02-20 ENCOUNTER — Encounter: Payer: Self-pay | Admitting: Internal Medicine

## 2014-02-20 ENCOUNTER — Ambulatory Visit (INDEPENDENT_AMBULATORY_CARE_PROVIDER_SITE_OTHER): Payer: Managed Care, Other (non HMO) | Admitting: Internal Medicine

## 2014-02-20 VITALS — BP 110/70 | HR 67 | Temp 98.2°F | Ht 66.0 in | Wt 148.0 lb

## 2014-02-20 DIAGNOSIS — I471 Supraventricular tachycardia: Secondary | ICD-10-CM

## 2014-02-20 DIAGNOSIS — R1032 Left lower quadrant pain: Secondary | ICD-10-CM

## 2014-02-20 DIAGNOSIS — F418 Other specified anxiety disorders: Secondary | ICD-10-CM

## 2014-02-20 DIAGNOSIS — R351 Nocturia: Secondary | ICD-10-CM

## 2014-02-20 NOTE — Progress Notes (Signed)
Pre visit review using our clinic review tool, if applicable. No additional management support is needed unless otherwise documented below in the visit note. 

## 2014-02-20 NOTE — Assessment & Plan Note (Signed)
Chronic w/exercise Abd [place]

## 2014-02-20 NOTE — Assessment & Plan Note (Signed)
Medication CCB On Amiodarone MRI, stress test pending

## 2014-02-20 NOTE — Progress Notes (Signed)
   Subjective:     HPI  F/u SVT episode (?coffe induced) Oct 2-3, 2015 in [place] (4 hrs)- was hospitalized; elevated enzymes. Doing well now  C/o LLQ pain w/exercise x years   She had a heart cath, ECHO. She is going to [facility]. She as put on Amiodarone 200 mg/d.   SBP was 40 on admission   Review of Systems  Constitutional: Negative for chills, activity change, appetite change, fatigue and unexpected weight change.  HENT: Negative for congestion, mouth sores and sinus pressure.   Eyes: Negative for visual disturbance.  Respiratory: Negative for apnea, cough, chest tightness, shortness of breath and wheezing.   Cardiovascular: Negative for leg swelling.  Gastrointestinal: Negative for nausea and abdominal pain.  Genitourinary: Negative for frequency, difficulty urinating and vaginal pain.  Musculoskeletal: Negative for back pain and gait problem.  Skin: Negative for pallor and rash.  Neurological: Negative for dizziness, tremors, weakness, numbness and headaches.  Psychiatric/Behavioral: Negative for suicidal ideas, confusion and sleep disturbance.       Objective:   Physical Exam  Constitutional: She appears well-developed. No distress.  HENT:  Head: Normocephalic.  Right Ear: External ear normal.  Left Ear: External ear normal.  Nose: Nose normal.  Mouth/Throat: Oropharynx is clear and moist.  Eyes: Conjunctivae are normal. Pupils are equal, round, and reactive to light. Right eye exhibits no discharge. Left eye exhibits no discharge.  Neck: Normal range of motion. Neck supple. No JVD present. No tracheal deviation present. No thyromegaly present.  Cardiovascular: Normal rate, regular rhythm and normal heart sounds.   Pulmonary/Chest: No stridor. No respiratory distress. She has no wheezes.  Abdominal: Soft. Bowel sounds are normal. She exhibits no distension and no mass. There is no tenderness. There is no rebound and no guarding.  Musculoskeletal: She exhibits  no edema or tenderness.  Lymphadenopathy:    She has no cervical adenopathy.  Neurological: She displays normal reflexes. No cranial nerve deficit. She exhibits normal muscle tone. Coordination normal.  Skin: No rash noted. No erythema.  Psychiatric: She has a normal mood and affect. Her behavior is normal. Judgment and thought content normal.    Labs, EKG Heart Cath ok      Assessment & Plan:

## 2014-02-22 NOTE — Assessment & Plan Note (Signed)
Doing well now 

## 2014-02-22 NOTE — Assessment & Plan Note (Signed)
Discussed She will try to modify diet and fluid intake

## 2014-02-23 NOTE — Telephone Encounter (Signed)
Spoke with patient aware of ablation date of 04/25/13 and labs for 12/30 at 8:00am

## 2014-02-27 ENCOUNTER — Ambulatory Visit
Admission: RE | Admit: 2014-02-27 | Discharge: 2014-02-27 | Disposition: A | Discharge status: Home / Self Care | Payer: Managed Care, Other (non HMO) | Source: Ambulatory Visit | Attending: Internal Medicine | Admitting: Internal Medicine

## 2014-02-28 ENCOUNTER — Telehealth: Payer: Self-pay | Admitting: *Deleted

## 2014-02-28 NOTE — Telephone Encounter (Signed)
OK. See Mychart Thx

## 2014-02-28 NOTE — Telephone Encounter (Signed)
Left msg on triage requesting U/S results...Johny Chess

## 2014-03-01 NOTE — Telephone Encounter (Signed)
Notified pt with md response. Pt states she has forgotten her password to [employer]. Gave her md response concerning U/S also mailed copy to her address per pt request. Gave her mychart support # as well...Johny Chess

## 2014-03-26 ENCOUNTER — Telehealth: Payer: Self-pay | Admitting: Internal Medicine

## 2014-03-26 NOTE — Telephone Encounter (Addendum)
SVT ablation on 04/25/14  She is aware of time and will come in for labs on 12/30  She is to stop Amiodarone

## 2014-03-26 NOTE — Telephone Encounter (Signed)
New problem    Pt need to speak to you concerning some test she had done. Please call pt.

## 2014-04-04 ENCOUNTER — Other Ambulatory Visit: Payer: Self-pay | Admitting: *Deleted

## 2014-04-04 ENCOUNTER — Encounter: Payer: Self-pay | Admitting: *Deleted

## 2014-04-04 DIAGNOSIS — I471 Supraventricular tachycardia: Secondary | ICD-10-CM

## 2014-04-10 ENCOUNTER — Other Ambulatory Visit: Payer: Self-pay | Admitting: Internal Medicine

## 2014-04-18 ENCOUNTER — Other Ambulatory Visit (INDEPENDENT_AMBULATORY_CARE_PROVIDER_SITE_OTHER): Payer: Managed Care, Other (non HMO) | Admitting: *Deleted

## 2014-04-18 DIAGNOSIS — I471 Supraventricular tachycardia: Secondary | ICD-10-CM

## 2014-04-18 LAB — BASIC METABOLIC PANEL
BUN: 21 mg/dL (ref 6–23)
CO2: 29 mEq/L (ref 19–32)
CREATININE: 0.9 mg/dL (ref 0.4–1.2)
Calcium: 9.1 mg/dL (ref 8.4–10.5)
Chloride: 106 mEq/L (ref 96–112)
GFR: 68.44 mL/min (ref >60.00)
Glucose, Bld: 75 mg/dL (ref 70–99)
POTASSIUM: 4 meq/L (ref 3.5–5.1)
Sodium: 140 mEq/L (ref 135–145)

## 2014-04-18 LAB — CBC
HEMATOCRIT: 38.1 % (ref 36.0–46.0)
HEMOGLOBIN: 12.6 g/dL (ref 12.0–15.0)
MCHC: 33.1 g/dL (ref 30.0–36.0)
MCV: 97.2 fl (ref 78.0–100.0)
PLATELETS: 216 10*3/uL (ref 150.0–400.0)
RBC: 3.92 Mil/uL (ref 3.87–5.11)
RDW: 13.4 % (ref 11.5–15.5)
WBC: 4.8 10*3/uL (ref 4.0–10.5)

## 2014-04-25 ENCOUNTER — Encounter (HOSPITAL_COMMUNITY)
Admission: RE | Disposition: A | Discharge status: Home / Self Care | Payer: Self-pay | Source: Ambulatory Visit | Attending: Internal Medicine

## 2014-04-25 ENCOUNTER — Ambulatory Visit (HOSPITAL_COMMUNITY)
Admission: RE | Admit: 2014-04-25 | Discharge: 2014-04-25 | Disposition: A | Discharge status: Home / Self Care | Payer: Managed Care, Other (non HMO) | Source: Ambulatory Visit | Attending: Internal Medicine | Admitting: Internal Medicine

## 2014-04-25 ENCOUNTER — Encounter (HOSPITAL_COMMUNITY): Payer: Self-pay | Admitting: General Practice

## 2014-04-25 DIAGNOSIS — Z79899 Other long term (current) drug therapy: Secondary | ICD-10-CM | POA: Diagnosis not present

## 2014-04-25 DIAGNOSIS — Z7982 Long term (current) use of aspirin: Secondary | ICD-10-CM | POA: Insufficient documentation

## 2014-04-25 DIAGNOSIS — I471 Supraventricular tachycardia, unspecified: Secondary | ICD-10-CM | POA: Diagnosis present

## 2014-04-25 DIAGNOSIS — Z880 Allergy status to penicillin: Secondary | ICD-10-CM | POA: Diagnosis not present

## 2014-04-25 DIAGNOSIS — I252 Old myocardial infarction: Secondary | ICD-10-CM | POA: Diagnosis not present

## 2014-04-25 DIAGNOSIS — G8929 Other chronic pain: Secondary | ICD-10-CM | POA: Diagnosis not present

## 2014-04-25 HISTORY — PX: SUPRAVENTRICULAR TACHYCARDIA ABLATION: SHX5492

## 2014-04-25 HISTORY — PX: ABLATION OF DYSRHYTHMIC FOCUS: SHX254

## 2014-04-25 SURGERY — SUPRAVENTRICULAR TACHYCARDIA ABLATION
Anesthesia: LOCAL

## 2014-04-25 MED ORDER — BUPIVACAINE HCL (PF) 0.25 % IJ SOLN
INTRAMUSCULAR | Status: AC
  Filled 2014-04-25: qty 30

## 2014-04-25 MED ORDER — LORAZEPAM 0.5 MG PO TABS: 0.5000 mg | ORAL_TABLET | Freq: Two times a day (BID) | ORAL | Status: DC | PRN

## 2014-04-25 MED ORDER — FENTANYL CITRATE 0.05 MG/ML IJ SOLN
INTRAMUSCULAR | Status: AC
  Filled 2014-04-25: qty 2

## 2014-04-25 MED ORDER — HEPARIN (PORCINE) IN NACL 2-0.9 UNIT/ML-% IJ SOLN
INTRAMUSCULAR | Status: AC
  Filled 2014-04-25: qty 500

## 2014-04-25 MED ORDER — EPINEPHRINE HCL 0.1 MG/ML IJ SOSY: 0.3000 mg | PREFILLED_SYRINGE | Freq: Once | INTRAMUSCULAR | Status: DC | PRN

## 2014-04-25 MED ORDER — EPINEPHRINE 0.3 MG/0.3ML IJ SOAJ: 0.3000 mg | Freq: Once | INTRAMUSCULAR | Status: DC

## 2014-04-25 MED ORDER — MIDAZOLAM HCL 5 MG/5ML IJ SOLN
INTRAMUSCULAR | Status: AC
  Filled 2014-04-25: qty 5

## 2014-04-25 MED ORDER — LORATADINE 10 MG PO TABS
10.0000 mg | ORAL_TABLET | Freq: Every day | ORAL | Status: DC
  Filled 2014-04-25: qty 1

## 2014-04-25 MED ORDER — SODIUM CHLORIDE 0.9 % IV SOLN
2.0000 ug/min | INTRAVENOUS | Status: DC
  Filled 2014-04-25: qty 2

## 2014-04-25 MED ORDER — ONDANSETRON HCL 4 MG/2ML IJ SOLN: 4.0000 mg | Freq: Four times a day (QID) | INTRAMUSCULAR | Status: DC | PRN

## 2014-04-25 MED ORDER — DILTIAZEM HCL ER COATED BEADS 120 MG PO CP24
120.0000 mg | ORAL_CAPSULE | Freq: Every day | ORAL | Status: DC
  Filled 2014-04-25: qty 1

## 2014-04-25 MED ORDER — SODIUM CHLORIDE 0.9 % IJ SOLN: 3.0000 mL | INTRAMUSCULAR | Status: DC | PRN

## 2014-04-25 MED ORDER — SODIUM CHLORIDE 0.9 % IV SOLN: 250.0000 mL | INTRAVENOUS | Status: DC | PRN

## 2014-04-25 MED ORDER — VITAMIN D3 25 MCG (1000 UNIT) PO TABS
1000.0000 [IU] | ORAL_TABLET | Freq: Every day | ORAL | Status: DC
  Administered 2014-04-25: 1000 [IU] via ORAL
  Filled 2014-04-25: qty 1

## 2014-04-25 MED ORDER — ZOLPIDEM TARTRATE 5 MG PO TABS
5.0000 mg | ORAL_TABLET | Freq: Every evening | ORAL | Status: DC | PRN
Start: 2014-04-25 — End: 2014-04-25

## 2014-04-25 MED ORDER — ACETAMINOPHEN 325 MG PO TABS: 650.0000 mg | ORAL_TABLET | ORAL | Status: DC | PRN

## 2014-04-25 MED ORDER — SODIUM CHLORIDE 0.9 % IJ SOLN
3.0000 mL | Freq: Two times a day (BID) | INTRAMUSCULAR | Status: DC
  Administered 2014-04-25: 3 mL via INTRAVENOUS

## 2014-04-25 MED ORDER — MIDAZOLAM HCL 5 MG/5ML IJ SOLN
INTRAMUSCULAR | Status: AC
Start: 2014-04-25 — End: 2014-04-25
  Filled 2014-04-25: qty 5

## 2014-04-25 NOTE — Interval H&P Note (Signed)
History and Physical Interval Note:  04/25/2014 7:34 AM  Kristin Torres  has presented today for surgery, with the diagnosis of svt  The various methods of treatment have been discussed with the patient and family. After consideration of risks, benefits and other options for treatment, the patient has consented to  Procedure(s): SUPRAVENTRICULAR TACHYCARDIA ABLATION (N/A) as a surgical intervention .  The patient's history has been reviewed, patient examined, no change in status, stable for surgery.  I have reviewed the patient's chart and labs.  Questions were answered to the patient's satisfaction.     Mikle Bosworth.D.

## 2014-04-25 NOTE — Progress Notes (Signed)
Pt A&O x4; pt discharge education and instructions completed with pt and spouse at bedside. All voices understanding and denies any questions. Pt IV and telemetry removed; pt discharge home with spouse to transport her home. Pt refuses wheelchair and insisted on ambulating out. Pt ambulated off unit with belongings and spouse at side. Francis Gaines Safire Gordin RN.

## 2014-04-25 NOTE — Progress Notes (Signed)
Site area: rt groin Site Prior to Removal:  Level 0 Pressure Applied For: 20 minutes Manual:   yes Patient Status During Pull:  stable Post Pull Site:  Level 0 Post Pull Instructions Given:  Yes Post Pull Pulses Present: yes Dressing Applied:  tegaderm Bedrest begins @ 7543 Comments: no complications

## 2014-04-25 NOTE — CV Procedure (Signed)
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 After the usual preparation and draping, intravenous Versed and fentanyl was used for sedation. A 6 French hexapolar catheter was inserted percutaneously into the right jugular vein and advanced to the coronary sinus. A 6 French quadripolar catheter was inserted percutaneously into the right femoral vein and advanced to the right ventricle. A 6 French quadripolar catheter was inserted percutaneously into the right femoral vein and advanced to the his bundle region. After measurement of the basic intervals, rapid ventricular pacing was carried out from the right ventricle, and stepwise decreased down to 350 ms where VA block was demonstrated. During rapid ventricular pacing, the atrial activation was midline and decremental. Next, programmed ventricular stimulation was carried out from the right ventricle at a pacing cycle length of 500 ms. The S1-S2 interval was stepwise decreased from 440 ms down to 320 ms, where ventricular refractoriness was observed. During programmed ventricular stimulation, atrial activation was midline and decremental. Next programmed atrial stimulation was carried out from the coronary sinus the patient cycle length of 500 ms. The S1-S2 interval was stepwise decreased from 440 ms down to 260 ms were atrial refractoriness was observed. During programmed atrial stimulation, there was an AH jump and echo beats. There was no inducible SVT. Next, rapid atrial pacing was  carried out from the coronary cycle length of 500 ms. The patient interval was then decreased down to 320 ms, where AV WB was noted. During atrial pacing, the PR interval was greater than the RR interval but no SVT was induced. Next, Isuprel was infused at 1-4 mics/min and additional rapid atrial pacing was carried out from the CS. At a pacing cycle length of 340 ms, SVT was induced at a cycle length of 350 ms. This was a narrow QRS tachycardia. The VA interval was short, less than 60 ms. PVCs were placed at the time of his bundle refractoriness, and did not pre-excite the atrium. Ventricular pacing during tachycardia resulted in a VAV conduction sequence. A diagnosis of AV node reentrant tachycardia was made. The cycle length was 350 ms. Isuprel was stopped during the ablation and then restarted after the ablation.  Mapping of the patient's region between the tricuspid valve annulus, the AV node, and the coronary sinus was carried out. This demonstrated a average sized coronary sinus, and the space between the coronary sinus and the tricuspid valve was small. Mapping demonstrated a small area where there was a small local atrial signal and a large local ventricular signal on the ablation catheter. 2 RF energy applications were delivered. Accelerated junctional rhythm was seen. There was no VA block Following the second radiofrequency energy application, pacing was carried out from the coronary sinus. The PR interval was less than the RR interval, and there was no inducible SVT. In addition, there is no other inducible arrhythmias. The patient was observed for approximately 20 minutes. Additional pacing was carried out demonstrating no inducible SVT. AV block was now demonstrated at 300 ms on isuprel. At this point the catheters were removed and the patient was returned to the recovery area for removal of her sheaths.  Complications: There  were no immediate procedure complications  Results. 1. Baseline  ECG. The baseline ECG demonstrated normal sinus rhythm with no ventricular preexcitation. 2. Baseline intervals. The sinus node cycle length was 927 ms. The QRS duration was 68 ms. The HV interval was 38 ms, and the AH interval was 60 ms. 3. Rapid ventricular pacing. Rapid ventricular pacing was carried out from the right ventricle demonstrating VA block at a cycle length of 350 ms. During rapid ventricular pacing, the atrial activation was midline and decremental. 4. Programmed ventricular stimulation. Programmed ventricular stimulation was carried out from the right ventricle and the patient cycle length of 500 ms. The S1-S2 interval was stepwise decreased from 440 ms down to 320 ms, where ventricular refractoriness was observed. During programmed ventricular stimulation, the atrial activation was midline and decremental. 5. Rapid atrial pacing. Rapid atrial pacing was carried out from the coronary sinus and the pacing cycle length of 600 ms, and stepwise decreased down to 350 ms, resulting AV block. Following catheter ablation, AV block was demonstrated at a pacing cycle length of 300 ms, on isuprel and there was no inducible SVT. 6. Programmed atrial stimulation. Programmed atrial stimulation was carried out from the coronary sinus at a pacing cycle length of 500 ms. The S1-S2 interval was stepwise decreased from 440 ms, down to 260 ms. During programmed atrial stimulation, there is no inducible SVT. There were multiple AH jumps and echo beats. 7. Arrhythmias observed. AV node reentrant tachycardia, induced with rapid atrial pacing at 340 ms from the coronary sinus, on isuprel, terminated with rapid atrial pacing at 250 ms, cycle length of the tachycardia was 358 ms. 8. Mapping. Mapping of the tachycardia demonstrated a very short VA interval, and midline atrial activation. 9. Radiofrequency energy application. 2 radiofrequency energy applications were delivered to the region fairly close to the AV  node. During radiofrequency energy application, accelerated junctional rhythm was seen with no VA block.  Conclusion. Successful catheter ablation of AV node reentrant tachycardia with 2 RF energy applications delivered to the region of the slow pathway which was fairly close to the patient's AV node. Following ablation, there was no inducible SVT or evidence of residual slow pathway conduction despite infusion with isuprel.  Mikle Bosworth.D

## 2014-04-25 NOTE — Progress Notes (Signed)
   04/25/14 1814  Vitals  BP 127/60 mmHg  MAP (mmHg) 76  BP Location Right Arm  BP Method Automatic  Patient Position (if appropriate) Lying  Pulse Rate 72  Pulse Rate Source Dinamap  Resp 18  Oxygen Therapy  SpO2 99 %  O2 Device Room Air  Pain Assessment  Pain Assessment No/denies pain   Vitals taken after pt ambulated 530ft on the unit. Pt denies any pain; groin and neck sites clean, dry and intact; level 0. Will continue to monitor pt quietly. Prior to discharge. Francis Gaines Karynn Deblasi RN.

## 2014-04-25 NOTE — Progress Notes (Signed)
Pt arrived to the unit from cath lab at 1300; pt A&O x4; groin site and right neck site are level 0, dsgs to both sites clean and intact; pt educated on her bedrest for 6hours; neuro vascular wnl to RLE; pt oriented to the unit and room; vitals taken, telemetry applied and called in to [place]; pain assessed; pt in bed comfortably and will continue to monitor pt quietly. Francis Gaines Manila Rommel RN.

## 2014-04-25 NOTE — Progress Notes (Signed)
Site area: rt neck Site Prior to Removal:  Level 0 Pressure Applied For: 15 minutes Manual:   yes Patient Status During Pull:  stable Post Pull Site:  Level 0 Post Pull Instructions Given:  yes Post Pull Pulses Present: NA Dressing Applied:  tegaderm Bedrest begins @ -- Comments: no complications

## 2014-04-25 NOTE — H&P (Signed)
HPI Dr. Luiz Iron is referred today for evaluation of SVT. 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 Approximately 9 years ago, the patient developed tachycardia palpitations and was diagnosed with SVT. She was treated with intravenous adenosine, restoring sinus rhythm. She has had 2 additional episodes of sustained SVT, one occurring while flying overseas where she was out of rhythm for many hours. She appears to have ruled in for non-ST elevation MI, and underwent catheterization which demonstrated no obstructive coronary disease. She has preserved left ventricular function. The patient thinks that her episodes of SVT have been related to caffeine. While in [place], she was treated with amiodarone which she still takes. The patient has been seen at [facility] but comes in today for second opinion. Her episodes start and stop suddenly. She has tried vagal maneuvers, these have been unsuccessful. She will at times have brief episodes of SVT which terminated spontaneously. She does not feel well on amiodarone. Allergies  Allergen Reactions  . Bee Venom Anaphylaxis  . Streptomycin Rash  . Penicillins Rash     Current Outpatient Prescriptions  Medication Sig Dispense Refill  . amiodarone (CORDARONE) 200 MG tablet Take 200 mg by mouth daily.    Marland Kitchen aspirin EC 81 MG tablet Take 1 tablet by mouth daily.    . cholecalciferol (VITAMIN D) 1000 UNITS tablet Take 1,000 Units by mouth daily.    Marland Kitchen EPINEPHrine (EPIPEN) 0.3 mg/0.3 mL SOAJ injection Inject 0.3 mLs (0.3 mg total) into the muscle once. 2 Device 2  . fexofenadine (ALLEGRA) 180 MG tablet Take 180 mg by mouth daily as needed. allergies     . LORazepam (ATIVAN) 0.5 MG tablet Take 1  tablet (0.5 mg total) by mouth 2 (two) times daily as needed for anxiety. 30 tablet 1  . zaleplon (SONATA) 10 MG capsule take 1 capsule by mouth at bedtime if needed for sleep 90 capsule 1   No current facility-administered medications for this visit.     Past Medical History  Diagnosis Date  . Tubular adenoma of colon   . SVT (supraventricular tachycardia)   . Cervicalgia   . Chronic back pain   . Personal history of other diseases of circulatory system   . Leg cramps   . Dermatophytosis of nail   . Sinusitis   . History of pneumonia   . MI (myocardial infarction)   . PSVT (paroxysmal supraventricular tachycardia)   . Nocturia     ROS:  All systems reviewed and negative except as noted in the HPI.   Past Surgical History  Procedure Laterality Date  . Breast excisional biopsy Right 2011    benign right breast  . Polypectomy    . Oophorectomy    . Appendectomy       Family History  Problem Relation Age of Onset  . Stomach cancer Paternal Grandmother   . Breast cancer Maternal Grandmother   . Diabetes Maternal Grandmother   . Diabetes Mother   . Heart attack Father 60  . Hypertension Mother      History   Social History  . Marital Status: Married    Spouse Name: N/A    Number of Children: 2  . Years of Education: N/A   Occupational History  . microbiologist    Social History Main  Topics  . Smoking status: Never Smoker   . Smokeless tobacco: Never Used  . Alcohol Use: 3.6 oz/week    6 Glasses of wine per week  . Drug Use: No  . Sexual Activity: Not on file   Other Topics Concern  . 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 HEART: Regular rate rhythm, no murmurs, no rubs, no clicks Abd: soft, positive bowel sounds, no organomegally, no rebound, no guarding Ext: 2 plus pulses, no edema, no cyanosis, no clubbing Skin: No rashes no nodules Neuro: CN II through XII intact, motor grossly intact  EKG - normal sinus rhythm with no ventricular preexcitation. Review of her ECG during tachycardia demonstrates a narrow QRS tachycardia, long RP tachycardia, at rates in the 195 beats per minute   Assess/Plan:            Paroxysmal SVT (supraventricular tachycardia) - Kristin Lance, MD at 02/08/2014 6:30 PM     Status: Kristin Torres Related Problem: Paroxysmal SVT (supraventricular tachycardia)   Expand All Collapse All   The patient has had a history of recurrent SVT despite medical therapy. I've discussed the treatment options in detail with the patient and her husband. The risk, goals, benefits, and expectations of catheter ablation have been discussed and she is considering her options, and will call us if she wishes to proceed with catheter ablation. The patient had many questions today and spent nearly an hour with me discussing her SVT and its treatment. I recommended the patient stop taking amiodarone as my experience has been that taking amiodarone may make it difficult to induce SVT in the future.       EP Attending  Patient seen and examined. Since my prior clinic visit, there has been no change in the history, physical exam, assessment and plan. She will proceed with EP study and catheter ablation of SVT.   Mikle Bosworth.D.

## 2014-04-25 NOTE — Discharge Summary (Signed)
  Physician Discharge Summary  Patient ID: Kristin Torres MRN: [id number] DOB/AGE: January 03, 1951 64 y.o.  Admit date: 04/25/2014 Discharge date: 04/25/2014  Admission Diagnoses: Paroxysmal SVT  Discharge Diagnoses:  Active Problems:   Paroxysmal SVT (supraventricular tachycardia)   SVT (supraventricular tachycardia)   Discharged Condition: stable  Hospital Course: The patient is a 64 y/o female with a 9 year h/o PSVT. She has a history of LHC revealing no obstructive CAD and preserved LV function. Recently, she has has frequent recurrences of SVT. She was referred to Dr. Lovena Le who recommended an EP study with possible SVT ablation.  She presented to [facility] on 04/25/14 for the planned procedure. The procedure was performed by Dr. Lovena Le (See full CV procedure note for details). During study, AV node reentrant tachycardia was induced. After mapping, she underwent successful cathter ablation of the AV node reentrant tachycardia with 2 RF energy applications delivered to the region of the slow pathway which was fairly close to the patient's AV node. Following ablation, there was no inducible SVT or evidence of residual slow pathway conduction despite infusion with isuprel. She left the EP lab in stable condition. She was monitored post procedure and had no complications or arrhthymias. Her cath access site remained stable. She was last seen by Dr. Lovena Le who determined she was stable for discharge. He also recommended discontinuation of her Cardizem. Post-hospital w/u will be arranged with Dr. Lovena Le.   Consults: None  Significant Diagnostic Studies: See CV procedure note for full details  Treatments: See Hospital Course  Discharge Exam: Blood pressure 134/80, pulse 71, temperature 97.9 F (36.6 C), temperature source Oral, resp. rate 18, height 5\' 5"  (1.651 m), weight 145 lb (65.772 kg), SpO2 100 %.   Disposition: Final discharge disposition not confirmed     Medication List    STOP taking  these medications        diltiazem 120 MG 24 hr capsule  Commonly known as:  CARDIZEM CD      TAKE these medications        cholecalciferol 1000 UNITS tablet  Commonly known as:  VITAMIN D  Take 1,000 Units by mouth daily.     EPINEPHrine 0.3 mg/0.3 mL Soaj injection  Commonly known as:  EPIPEN  Inject 0.3 mLs (0.3 mg total) into the muscle once.     fexofenadine 180 MG tablet  Commonly known as:  ALLEGRA  Take 180 mg by mouth daily as needed. allergies     LORazepam 0.5 MG tablet  Commonly known as:  ATIVAN  Take 1 tablet (0.5 mg total) by mouth 2 (two) times daily as needed for anxiety.     zaleplon 10 MG capsule  Commonly known as:  SONATA  take 1 capsule by mouth at bedtime if needed for sleep       Follow-up Information    Follow up with Cristopher Peru, MD.   Specialty:  Cardiology   Why:  our office will call you with a follow-up appointment    Contact information:   [address] [place] [address] [phone]       TIME SPENT ON DISCHARGE, INCLUDING PHYSICIAN TIME: >30 MINUTES   Signed: Lyda Jester 04/25/2014, 4:07 PM  EP Attending  Patient seen and examined. Agree with above.   Mikle Bosworth.D.

## 2014-04-27 ENCOUNTER — Telehealth: Payer: Self-pay | Admitting: Internal Medicine

## 2014-04-27 NOTE — Telephone Encounter (Signed)
New message      Pt recently had an ablation. She has questions

## 2014-04-27 NOTE — Telephone Encounter (Signed)
lmom for patient to call me back and let her know I was off on Monday

## 2014-05-01 NOTE — Telephone Encounter (Signed)
Feels great, her question is when can restart Yoga, dance class, training.  Dr Lovena Le advised after one week she can do whatever she wants.  She is aware

## 2014-05-01 NOTE — Telephone Encounter (Signed)
F/U          Pt returning call, please call back.

## 2014-05-15 ENCOUNTER — Telehealth: Payer: Self-pay | Admitting: Internal Medicine

## 2014-05-15 ENCOUNTER — Ambulatory Visit: Payer: Managed Care, Other (non HMO) | Admitting: Internal Medicine

## 2014-05-15 NOTE — Telephone Encounter (Signed)
F/U    Pt calling back...  Pt c/o BP issue:  1. What are your last 5 BP readings? Pt took bp an hr ago.  2. Are you having any other symptoms (ex. Dizziness, headache, blurred vision, passed out)? Headache and chills  3. What is your medication issue? Pt doesn't have any medications.

## 2014-05-15 NOTE — Telephone Encounter (Signed)
Spoke with patient and she had been on Cardizem 120 mg for years.  After the ablation it was discontinued and she feels it may have masked a BP problem.  She has a family history of HTN.  She restarted the Cardizem 120 mg yesterday.  I have discussed with Dr Lovena Le he advises she continue with the mediation and get a follow up with her PCP Dr. Alain Marion next week.  She does have an appointment with Dr Lovena Le on 05/24/14 and she will keep that as scheduled.  She will call me back if needed

## 2014-05-15 NOTE — Telephone Encounter (Signed)
New Message  Pt c/o BP issue: STAT if pt c/o blurred vision, one-sided weakness or slurred speech  1. What are your last 5 BP readings?  1/26- 1030am- 160/98 1/26- 8am- 155/100 1/25- pm- 145/95  2. Are you having any other symptoms (ex. Dizziness, headache, blurred vision, passed out)? headache  3. What is your BP issue? Elevated BP  Please call back and discuss

## 2014-05-17 ENCOUNTER — Ambulatory Visit: Payer: Managed Care, Other (non HMO) | Admitting: Internal Medicine

## 2014-05-17 ENCOUNTER — Encounter: Payer: Self-pay | Admitting: Internal Medicine

## 2014-05-17 ENCOUNTER — Other Ambulatory Visit (INDEPENDENT_AMBULATORY_CARE_PROVIDER_SITE_OTHER): Payer: Managed Care, Other (non HMO)

## 2014-05-17 ENCOUNTER — Ambulatory Visit (INDEPENDENT_AMBULATORY_CARE_PROVIDER_SITE_OTHER): Payer: Managed Care, Other (non HMO) | Admitting: Internal Medicine

## 2014-05-17 ENCOUNTER — Telehealth: Payer: Self-pay | Admitting: Internal Medicine

## 2014-05-17 VITALS — BP 148/88 | HR 71 | Temp 97.6°F | Ht 65.0 in | Wt 146.2 lb

## 2014-05-17 DIAGNOSIS — I471 Supraventricular tachycardia: Secondary | ICD-10-CM

## 2014-05-17 DIAGNOSIS — I10 Essential (primary) hypertension: Secondary | ICD-10-CM

## 2014-05-17 LAB — URINALYSIS, ROUTINE W REFLEX MICROSCOPIC
Bilirubin Urine: NEGATIVE
Hgb urine dipstick: NEGATIVE
Ketones, ur: NEGATIVE
Nitrite: NEGATIVE
RBC / HPF: NONE SEEN (ref 0–?)
SPECIFIC GRAVITY, URINE: 1.01 (ref 1.000–1.030)
Total Protein, Urine: NEGATIVE
Urine Glucose: NEGATIVE
Urobilinogen, UA: 0.2 (ref 0.0–1.0)
pH: 7.5 (ref 5.0–8.0)

## 2014-05-17 LAB — CBC WITH DIFFERENTIAL/PLATELET
BASOS PCT: 0.4 % (ref 0.0–3.0)
Basophils Absolute: 0 10*3/uL (ref 0.0–0.1)
Eosinophils Absolute: 0.1 10*3/uL (ref 0.0–0.7)
Eosinophils Relative: 1 % (ref 0.0–5.0)
HCT: 40.6 % (ref 36.0–46.0)
Hemoglobin: 13.7 g/dL (ref 12.0–15.0)
Lymphocytes Relative: 43.7 % (ref 12.0–46.0)
Lymphs Abs: 2.5 10*3/uL (ref 0.7–4.0)
MCHC: 33.8 g/dL (ref 30.0–36.0)
MCV: 95 fl (ref 78.0–100.0)
MONO ABS: 0.3 10*3/uL (ref 0.1–1.0)
MONOS PCT: 5.3 % (ref 3.0–12.0)
NEUTROS ABS: 2.8 10*3/uL (ref 1.4–7.7)
Neutrophils Relative %: 49.6 % (ref 43.0–77.0)
PLATELETS: 223 10*3/uL (ref 150.0–400.0)
RBC: 4.28 Mil/uL (ref 3.87–5.11)
RDW: 12.7 % (ref 11.5–15.5)
WBC: 5.6 10*3/uL (ref 4.0–10.5)

## 2014-05-17 LAB — BASIC METABOLIC PANEL
BUN: 13 mg/dL (ref 6–23)
CALCIUM: 10 mg/dL (ref 8.4–10.5)
CO2: 29 meq/L (ref 19–32)
CREATININE: 0.75 mg/dL (ref 0.40–1.20)
Chloride: 102 mEq/L (ref 96–112)
GFR: 83.37 mL/min (ref >60.00)
GLUCOSE: 108 mg/dL — AB (ref 70–99)
Potassium: 4.5 mEq/L (ref 3.5–5.1)
SODIUM: 139 meq/L (ref 135–145)

## 2014-05-17 LAB — VITAMIN B12: Vitamin B-12: 435 pg/mL (ref 211–911)

## 2014-05-17 LAB — HEPATIC FUNCTION PANEL
ALK PHOS: 68 U/L (ref 39–117)
ALT: 17 U/L (ref 0–35)
AST: 21 U/L (ref 0–37)
Albumin: 4.5 g/dL (ref 3.5–5.2)
BILIRUBIN TOTAL: 0.4 mg/dL (ref 0.2–1.2)
Bilirubin, Direct: 0.1 mg/dL (ref 0.0–0.3)
Total Protein: 7.2 g/dL (ref 6.0–8.3)

## 2014-05-17 LAB — TSH: TSH: 1.76 u[IU]/mL (ref 0.35–4.50)

## 2014-05-17 LAB — VITAMIN D 25 HYDROXY (VIT D DEFICIENCY, FRACTURES): VITD: 38.38 ng/mL (ref 30.00–100.00)

## 2014-05-17 MED ORDER — DILTIAZEM HCL ER COATED BEADS 180 MG PO CP24: 180.0000 mg | ORAL_CAPSULE | Freq: Every day | ORAL | Status: DC

## 2014-05-17 MED ORDER — ASPIRIN 81 MG PO CHEW: 81.0000 mg | CHEWABLE_TABLET | Freq: Every day | ORAL | Status: DC

## 2014-05-17 NOTE — Telephone Encounter (Signed)
Patient is concerned about high bp. i have scheduled her to be seen w/ dr hopper this morning. She called dr plot's personal line and left a vm. She would not tell me what the contents of the message were, but insists that she needs ot talk to you. Please give this patient a call.

## 2014-05-17 NOTE — Progress Notes (Signed)
Pre visit review using our clinic review tool, if applicable. No additional management support is needed unless otherwise documented below in the visit note. 

## 2014-05-17 NOTE — Assessment & Plan Note (Addendum)
1/16 Glee was on Diltiazem for a long time, stopped in Dec after cardioversion - BP went up Increase Cardizem CD to 180 mg/d

## 2014-05-17 NOTE — Progress Notes (Signed)
   Subjective:     HPI  F/u SVT episode (?coffe induced) Oct 2-3, 2015 in [place] (4 hrs)- was hospitalized; elevated enzymes. Doing well now s/p cardioversion 04/25/14. Cardizem and ASA were stopped after cardioversion.  F/u LLQ pain w/exercise x years   She had a heart cath, ECHO.    Review of Systems  Constitutional: Negative for chills, activity change, appetite change, fatigue and unexpected weight change.  HENT: Negative for congestion, mouth sores and sinus pressure.   Eyes: Negative for visual disturbance.  Respiratory: Negative for apnea, cough, chest tightness, shortness of breath and wheezing.   Cardiovascular: Negative for leg swelling.  Gastrointestinal: Negative for nausea and abdominal pain.  Genitourinary: Negative for frequency, difficulty urinating and vaginal pain.  Musculoskeletal: Negative for back pain and gait problem.  Skin: Negative for pallor and rash.  Neurological: Negative for dizziness, tremors, weakness, numbness and headaches.  Psychiatric/Behavioral: Negative for suicidal ideas, confusion and sleep disturbance.       Objective:   Physical Exam  Constitutional: She appears well-developed. No distress.  HENT:  Head: Normocephalic.  Right Ear: External ear normal.  Left Ear: External ear normal.  Nose: Nose normal.  Mouth/Throat: Oropharynx is clear and moist.  Eyes: Conjunctivae are normal. Pupils are equal, round, and reactive to light. Right eye exhibits no discharge. Left eye exhibits no discharge.  Neck: Normal range of motion. Neck supple. No JVD present. No tracheal deviation present. No thyromegaly present.  Cardiovascular: Normal rate, regular rhythm and normal heart sounds.   Pulmonary/Chest: No stridor. No respiratory distress. She has no wheezes.  Abdominal: Soft. Bowel sounds are normal. She exhibits no distension and no mass. There is no tenderness. There is no rebound and no guarding.  Musculoskeletal: She exhibits no edema or  tenderness.  Lymphadenopathy:    She has no cervical adenopathy.  Neurological: She displays normal reflexes. No cranial nerve deficit. She exhibits normal muscle tone. Coordination normal.  Skin: No rash noted. No erythema.  Psychiatric: She has a normal mood and affect. Her behavior is normal. Judgment and thought content normal.   EKG NSR  Heart Cath ok      Assessment & Plan:

## 2014-05-17 NOTE — Patient Instructions (Signed)

## 2014-05-17 NOTE — Telephone Encounter (Signed)
Is there anything that she can do or needs to do (rx or labs) to get her BP lower until new meds take effect.   155/95 was the last BP reading. Pt wants to see her MD only.   Spoke to PCP and pt is squeezed in on PCP schedule.

## 2014-05-17 NOTE — Assessment & Plan Note (Addendum)
Dr Lovena Le Cardioversion 1/16 s/p Re-start baby ASA

## 2014-05-18 ENCOUNTER — Telehealth: Payer: Self-pay | Admitting: Internal Medicine

## 2014-05-18 MED ORDER — LOSARTAN POTASSIUM-HCTZ 50-12.5 MG PO TABS: 1.0000 | ORAL_TABLET | Freq: Every day | ORAL | Status: DC

## 2014-05-18 MED ORDER — CIPROFLOXACIN HCL 250 MG PO TABS: 250.0000 mg | ORAL_TABLET | Freq: Two times a day (BID) | ORAL | Status: DC

## 2014-05-18 NOTE — Telephone Encounter (Signed)
OK Cipro - done Thx

## 2014-05-18 NOTE — Telephone Encounter (Signed)
Notified pt with md response. She would like antibiotic sen to [facility]. Having urine frequency with little burning...Kristin Torres

## 2014-05-18 NOTE — Telephone Encounter (Signed)
Called pt to verify msg. Pt states her BP is still rising. Check BP this am 170/90. MD did increase diltiazem started the new dosage this am. She is wanting to know is there anything else she can take or do to make her BP decrease. She denies any symptomatic symptoms...Johny Chess

## 2014-05-18 NOTE — Telephone Encounter (Signed)
Pt notified antibiotic sent to [facility]...Kristin Torres

## 2014-05-18 NOTE — Telephone Encounter (Signed)
Pls add Losartan HCT 1 a day today (emailed) Thx

## 2014-05-18 NOTE — Telephone Encounter (Signed)
Pt called in said that Dr Camila Li increased meds but it continue to go up.  What should she do?

## 2014-05-21 ENCOUNTER — Telehealth: Payer: Self-pay | Admitting: Internal Medicine

## 2014-05-21 ENCOUNTER — Ambulatory Visit: Payer: Managed Care, Other (non HMO) | Admitting: Internal Medicine

## 2014-05-21 NOTE — Telephone Encounter (Signed)
Pt called in and requesting a return call on how she needs to be taking her meds correctly?

## 2014-05-22 ENCOUNTER — Encounter: Payer: Self-pay | Admitting: Internal Medicine

## 2014-05-22 ENCOUNTER — Ambulatory Visit (INDEPENDENT_AMBULATORY_CARE_PROVIDER_SITE_OTHER): Payer: Managed Care, Other (non HMO) | Admitting: Internal Medicine

## 2014-05-22 VITALS — BP 144/88 | HR 65 | Ht 66.0 in | Wt 149.0 lb

## 2014-05-22 DIAGNOSIS — I471 Supraventricular tachycardia: Secondary | ICD-10-CM

## 2014-05-22 DIAGNOSIS — F418 Other specified anxiety disorders: Secondary | ICD-10-CM

## 2014-05-22 DIAGNOSIS — I10 Essential (primary) hypertension: Secondary | ICD-10-CM

## 2014-05-22 NOTE — Assessment & Plan Note (Signed)
She has no additional SVT since her ablation several weeks ago. She will undergo watchful waiting.

## 2014-05-22 NOTE — Assessment & Plan Note (Signed)
Her blood pressure was 160/90 on my exam. I discussed the treatment options. I have recommended she take cardizem 120 mg twice daily for three more days then if her blood pressure remains high, she can start the losartan/HCTZ. I have asked that she followup with Dr. Alain Marion for additonal titration of her blood pressure meds. She is encouraged to reduce her sodium intake.

## 2014-05-22 NOTE — Patient Instructions (Signed)
Your physician recommends that you schedule a follow-up appointment as needed with Dr Taylor  

## 2014-05-22 NOTE — Assessment & Plan Note (Signed)
I discussed this with her today and she admitted to having a problem with anxiousness around her multiple problems. I have encouraged her to use her Ativan only in unusually stressful circumstances.

## 2014-05-22 NOTE — Telephone Encounter (Signed)
Pt called again, requesting a call back today.

## 2014-05-22 NOTE — Progress Notes (Signed)
HPI Dr. Luiz Iron returns today for followup of her blood pressure. She is a pleasant 64 yo woman with a h/o SVT who was on multiple medications for SVT who underwent catheter ablation several weeks ago. In the interim, she has had no recurrent SVT but she has had worsening HTN. This became clear when she weaned off of her meds for her SVT. She is now started back on cardizem. She was taking 180 mg daily but found that her blood pressure was in the 637 systolic range. The patient was given another prescription for Losartan/HCTZ but has not taken it. She has been instructed to increase the cardizem to 120 bid. She appears anxious.  Allergies  Allergen Reactions  . Bee Venom Anaphylaxis  . Streptomycin Rash  . Mango Flavor Swelling  . Caffeine Palpitations  . Penicillins Rash     Current Outpatient Prescriptions  Medication Sig Dispense Refill  . aspirin (ASPIRIN CHILDRENS) 81 MG chewable tablet Chew 1 tablet (81 mg total) by mouth daily. 100 tablet 11  . cholecalciferol (VITAMIN D) 1000 UNITS tablet Take 1,000 Units by mouth daily.    Marland Kitchen diltiazem (DILACOR XR) 120 MG 24 hr capsule Take 120 mg by mouth 2 (two) times daily.    Marland Kitchen EPINEPHrine (EPIPEN) 0.3 mg/0.3 mL SOAJ injection Inject 0.3 mLs (0.3 mg total) into the muscle once. 2 Device 2  . fexofenadine (ALLEGRA) 180 MG tablet Take 180 mg by mouth daily as needed. allergies     . LORazepam (ATIVAN) 0.5 MG tablet Take 1 tablet (0.5 mg total) by mouth 2 (two) times daily as needed for anxiety. 30 tablet 1  . zaleplon (SONATA) 10 MG capsule take 1 capsule by mouth at bedtime if needed for sleep 90 capsule 1  . ciprofloxacin (CIPRO) 250 MG tablet Take 1 tablet (250 mg total) by mouth 2 (two) times daily. (Patient not taking: Reported on 05/22/2014) 6 tablet 0   No current facility-administered medications for this visit.     Past Medical History  Diagnosis Date  . Tubular adenoma of colon   . SVT (supraventricular tachycardia)   .  Cervicalgia   . Chronic back pain   . Personal history of other diseases of circulatory system   . Leg cramps   . Dermatophytosis of nail   . Sinusitis   . History of pneumonia   . MI (myocardial infarction)   . PSVT (paroxysmal supraventricular tachycardia)   . Nocturia     ROS:   All systems reviewed and negative except as noted in the HPI.   Past Surgical History  Procedure Laterality Date  . Breast excisional biopsy Right 2011    benign  right breast  . Polypectomy    . Oophorectomy    . Appendectomy    . Ablation of dysrhythmic focus  04/25/2014    svt  . Supraventricular tachycardia ablation N/A 04/25/2014    Procedure: SUPRAVENTRICULAR TACHYCARDIA ABLATION;  Surgeon: Evans Lance, MD;  Location: [facility] CATH LAB;  Service: Cardiovascular;  Laterality: N/A;     Family History  Problem Relation Age of Onset  . Stomach cancer Paternal Grandmother   . Breast cancer Maternal Grandmother   . Diabetes Maternal Grandmother   . Diabetes Mother   . Heart attack Father 91  . Hypertension Mother      History   Social History  . Marital Status: Married    Spouse Name: N/A    Number of Children: 2  .  Years of Education: N/A   Occupational History  . microbiologist    Social History Main Topics  . Smoking status: Never Smoker   . Smokeless tobacco: Never Used  . Alcohol Use: 3.6 oz/week    6 Glasses of wine per week  . Drug Use: No  . Sexual Activity: Not on file   Other Topics Concern  . 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   Work: Architectural technologist in private [employer]   Married '75   2 sons '76, '83   Marriage in good health   Hobbies avid skier     BP 144/88 mmHg  Pulse 65  Ht 5\' 6"  (1.676 m)  Wt 149 lb (67.586 kg)  BMI 24.06 kg/m2  Physical Exam:  Well appearing 64 yo woman, NAD HEENT: Unremarkable Neck:  No JVD, no thyromegally Back:  No CVA tenderness Lungs:  Clear HEART:  Regular  rate rhythm, no murmurs, no rubs, no clicks Abd:  soft, positive bowel sounds, no organomegally, no rebound, no guarding Ext:  2 plus pulses, no edema, no cyanosis, no clubbing Skin:  No rashes no nodules Neuro:  CN II through XII intact, motor grossly intact   Assess/Plan:

## 2014-05-23 NOTE — Telephone Encounter (Signed)
OK to ref Lorazepam w/3 ref I called pt and left VM x2  Thx

## 2014-05-23 NOTE — Telephone Encounter (Signed)
Left mess for patient to call back.   Also, received Rf req for Lorazepam 0.5 mg 1 po bid prn. Ok to Rf?

## 2014-05-24 MED ORDER — LORAZEPAM 0.5 MG PO TABS: 0.5000 mg | ORAL_TABLET | Freq: Two times a day (BID) | ORAL | Status: DC | PRN

## 2014-05-24 NOTE — Telephone Encounter (Signed)
Faxed script lorazepam to [facility]...Kristin Torres

## 2014-05-25 ENCOUNTER — Ambulatory Visit (INDEPENDENT_AMBULATORY_CARE_PROVIDER_SITE_OTHER): Payer: Managed Care, Other (non HMO) | Admitting: Internal Medicine

## 2014-05-25 ENCOUNTER — Encounter: Payer: Self-pay | Admitting: Internal Medicine

## 2014-05-25 VITALS — BP 120/74 | HR 71 | Temp 97.6°F | Wt 146.0 lb

## 2014-05-25 DIAGNOSIS — I471 Supraventricular tachycardia: Secondary | ICD-10-CM

## 2014-05-25 DIAGNOSIS — I10 Essential (primary) hypertension: Secondary | ICD-10-CM

## 2014-05-25 MED ORDER — LOSARTAN POTASSIUM-HCTZ 50-12.5 MG PO TABS: 1.0000 | ORAL_TABLET | Freq: Every day | ORAL | Status: DC

## 2014-05-25 NOTE — Assessment & Plan Note (Signed)
Continue with current prescription therapy as reflected on the Med list.  

## 2014-05-25 NOTE — Progress Notes (Signed)
   Subjective:     HPI  F/u SVT episode (?coffe induced) Oct 2-3, 2015 in [place] (4 hrs)- was hospitalized; elevated enzymes. Doing well now s/p cardioversion 04/25/14. Cardizem and ASA were stopped after cardioversion.  F/u LLQ pain w/exercise x years   She had a heart cath, ECHO.   BP Readings from Last 3 Encounters:  05/25/14 120/74  05/22/14 144/88  05/17/14 148/88   Wt Readings from Last 3 Encounters:  05/25/14 146 lb (66.225 kg)  05/22/14 149 lb (67.586 kg)  05/17/14 146 lb 4 oz (66.339 kg)       Review of Systems  Constitutional: Negative for chills, activity change, appetite change, fatigue and unexpected weight change.  HENT: Negative for congestion, mouth sores and sinus pressure.   Eyes: Negative for visual disturbance.  Respiratory: Negative for apnea, cough, chest tightness, shortness of breath and wheezing.   Cardiovascular: Negative for leg swelling.  Gastrointestinal: Negative for nausea and abdominal pain.  Genitourinary: Negative for frequency, difficulty urinating and vaginal pain.  Musculoskeletal: Negative for back pain and gait problem.  Skin: Negative for pallor and rash.  Neurological: Negative for dizziness, tremors, weakness, numbness and headaches.  Psychiatric/Behavioral: Negative for suicidal ideas, confusion and sleep disturbance.       Objective:   Physical Exam  Constitutional: She appears well-developed. No distress.  HENT:  Head: Normocephalic.  Right Ear: External ear normal.  Left Ear: External ear normal.  Nose: Nose normal.  Mouth/Throat: Oropharynx is clear and moist.  Eyes: Conjunctivae are normal. Pupils are equal, round, and reactive to light. Right eye exhibits no discharge. Left eye exhibits no discharge.  Neck: Normal range of motion. Neck supple. No JVD present. No tracheal deviation present. No thyromegaly present.  Cardiovascular: Normal rate, regular rhythm and normal heart sounds.   Pulmonary/Chest: No stridor. No  respiratory distress. She has no wheezes.  Abdominal: Soft. Bowel sounds are normal. She exhibits no distension and no mass. There is no tenderness. There is no rebound and no guarding.  Musculoskeletal: She exhibits no edema or tenderness.  Lymphadenopathy:    She has no cervical adenopathy.  Neurological: She displays normal reflexes. No cranial nerve deficit. She exhibits normal muscle tone. Coordination normal.  Skin: No rash noted. No erythema.  Psychiatric: She has a normal mood and affect. Her behavior is normal. Judgment and thought content normal.   EKG NSR  Heart Cath ok      Assessment & Plan:  Patient ID: Kristin Torres, female   DOB: [birth year], 64 y.o.   MRN: [id number]

## 2014-05-25 NOTE — Progress Notes (Signed)
Pre visit review using our clinic review tool, if applicable. No additional management support is needed unless otherwise documented below in the visit note. 

## 2014-05-26 ENCOUNTER — Encounter: Payer: Self-pay | Admitting: Internal Medicine

## 2014-05-29 ENCOUNTER — Telehealth: Payer: Self-pay | Admitting: Internal Medicine

## 2014-05-29 NOTE — Telephone Encounter (Signed)
emmi emailed °

## 2014-07-03 ENCOUNTER — Other Ambulatory Visit: Payer: Self-pay | Admitting: *Deleted

## 2014-07-03 MED ORDER — DILTIAZEM HCL ER 180 MG PO CP24: 180.0000 mg | ORAL_CAPSULE | Freq: Every day | ORAL | Status: DC

## 2014-07-18 ENCOUNTER — Other Ambulatory Visit: Payer: Self-pay | Admitting: *Deleted

## 2014-07-18 MED ORDER — LOSARTAN POTASSIUM-HCTZ 50-12.5 MG PO TABS: 1.0000 | ORAL_TABLET | Freq: Every day | ORAL | Status: DC

## 2014-08-15 ENCOUNTER — Other Ambulatory Visit: Payer: Self-pay

## 2014-08-15 MED ORDER — ZALEPLON 10 MG PO CAPS: 10.0000 mg | ORAL_CAPSULE | Freq: Every evening | ORAL | Status: DC | PRN

## 2014-09-27 ENCOUNTER — Telehealth: Payer: Self-pay | Admitting: Internal Medicine

## 2014-09-27 ENCOUNTER — Ambulatory Visit (INDEPENDENT_AMBULATORY_CARE_PROVIDER_SITE_OTHER): Payer: Managed Care, Other (non HMO) | Admitting: Internal Medicine

## 2014-09-27 ENCOUNTER — Encounter: Payer: Self-pay | Admitting: Internal Medicine

## 2014-09-27 ENCOUNTER — Other Ambulatory Visit (INDEPENDENT_AMBULATORY_CARE_PROVIDER_SITE_OTHER): Payer: Managed Care, Other (non HMO)

## 2014-09-27 VITALS — BP 118/72 | HR 68 | Wt 148.0 lb

## 2014-09-27 DIAGNOSIS — Z658 Other specified problems related to psychosocial circumstances: Secondary | ICD-10-CM

## 2014-09-27 DIAGNOSIS — R42 Dizziness and giddiness: Secondary | ICD-10-CM

## 2014-09-27 DIAGNOSIS — R739 Hyperglycemia, unspecified: Secondary | ICD-10-CM | POA: Diagnosis not present

## 2014-09-27 DIAGNOSIS — F439 Reaction to severe stress, unspecified: Secondary | ICD-10-CM | POA: Insufficient documentation

## 2014-09-27 DIAGNOSIS — R5383 Other fatigue: Secondary | ICD-10-CM | POA: Insufficient documentation

## 2014-09-27 DIAGNOSIS — I471 Supraventricular tachycardia: Secondary | ICD-10-CM | POA: Diagnosis not present

## 2014-09-27 DIAGNOSIS — I10 Essential (primary) hypertension: Secondary | ICD-10-CM

## 2014-09-27 LAB — BASIC METABOLIC PANEL
BUN: 22 mg/dL (ref 6–23)
CO2: 33 meq/L — AB (ref 19–32)
Calcium: 9.5 mg/dL (ref 8.4–10.5)
Chloride: 101 mEq/L (ref 96–112)
Creatinine, Ser: 0.99 mg/dL (ref 0.40–1.20)
GFR: 60.44 mL/min (ref >60.00)
Glucose, Bld: 95 mg/dL (ref 70–99)
POTASSIUM: 4.3 meq/L (ref 3.5–5.1)
SODIUM: 137 meq/L (ref 135–145)

## 2014-09-27 LAB — CBC WITH DIFFERENTIAL/PLATELET
BASOS PCT: 0.6 % (ref 0.0–3.0)
Basophils Absolute: 0 10*3/uL (ref 0.0–0.1)
EOS PCT: 1.4 % (ref 0.0–5.0)
Eosinophils Absolute: 0.1 10*3/uL (ref 0.0–0.7)
HCT: 37.4 % (ref 36.0–46.0)
Hemoglobin: 12.6 g/dL (ref 12.0–15.0)
Lymphocytes Relative: 46.7 % — ABNORMAL HIGH (ref 12.0–46.0)
Lymphs Abs: 2.6 10*3/uL (ref 0.7–4.0)
MCHC: 33.6 g/dL (ref 30.0–36.0)
MCV: 95.9 fl (ref 78.0–100.0)
MONO ABS: 0.3 10*3/uL (ref 0.1–1.0)
Monocytes Relative: 4.7 % (ref 3.0–12.0)
Neutro Abs: 2.6 10*3/uL (ref 1.4–7.7)
Neutrophils Relative %: 46.6 % (ref 43.0–77.0)
PLATELETS: 234 10*3/uL (ref 150.0–400.0)
RBC: 3.9 Mil/uL (ref 3.87–5.11)
RDW: 12.4 % (ref 11.5–15.5)
WBC: 5.5 10*3/uL (ref 4.0–10.5)

## 2014-09-27 LAB — GLUCOSE, POCT (MANUAL RESULT ENTRY): POC GLUCOSE: 127 mg/dL — AB (ref 70–99)

## 2014-09-27 LAB — LIPID PANEL
CHOLESTEROL: 210 mg/dL — AB (ref 0–200)
HDL: 68 mg/dL (ref >39.00)
LDL Cholesterol: 129 mg/dL — ABNORMAL HIGH (ref 0–99)
NonHDL: 142
TRIGLYCERIDES: 64 mg/dL (ref 0.0–149.0)
Total CHOL/HDL Ratio: 3
VLDL: 12.8 mg/dL (ref 0.0–40.0)

## 2014-09-27 LAB — HEPATIC FUNCTION PANEL
ALBUMIN: 4.2 g/dL (ref 3.5–5.2)
ALT: 20 U/L (ref 0–35)
AST: 19 U/L (ref 0–37)
Alkaline Phosphatase: 75 U/L (ref 39–117)
BILIRUBIN DIRECT: 0.1 mg/dL (ref 0.0–0.3)
TOTAL PROTEIN: 7 g/dL (ref 6.0–8.3)
Total Bilirubin: 0.4 mg/dL (ref 0.2–1.2)

## 2014-09-27 LAB — URINALYSIS, ROUTINE W REFLEX MICROSCOPIC
Bilirubin Urine: NEGATIVE
Hgb urine dipstick: NEGATIVE
Ketones, ur: NEGATIVE
Nitrite: NEGATIVE
RBC / HPF: NONE SEEN (ref 0–?)
SPECIFIC GRAVITY, URINE: 1.01 (ref 1.000–1.030)
Total Protein, Urine: NEGATIVE
Urine Glucose: NEGATIVE
Urobilinogen, UA: 0.2 (ref 0.0–1.0)
pH: 6 (ref 5.0–8.0)

## 2014-09-27 LAB — HEMOGLOBIN A1C: Hgb A1c MFr Bld: 5.5 % (ref 4.6–6.5)

## 2014-09-27 LAB — TSH: TSH: 0.73 u[IU]/mL (ref 0.35–4.50)

## 2014-09-27 LAB — T4, FREE: FREE T4: 0.89 ng/dL (ref 0.60–1.60)

## 2014-09-27 MED ORDER — FLUOXETINE HCL 10 MG PO TABS: 10.0000 mg | ORAL_TABLET | Freq: Every day | ORAL | Status: DC

## 2014-09-27 NOTE — Telephone Encounter (Signed)
Patient has appointment with Dr. Alain Marion tomorrow 6/10.  She is requesting to be worked in today bc she is light headed and she does not know if she is having chest pain or not.

## 2014-09-27 NOTE — Assessment & Plan Note (Signed)
6/16 - anxiety, insomnia, fatigue Start Prozac low dose Zaleplon prn Rest

## 2014-09-27 NOTE — Assessment & Plan Note (Signed)
6/16: x 2 wks -- viral illness vs stress vs other Labs, EKG Treat depression, insomnia Other tests if not better

## 2014-09-27 NOTE — Progress Notes (Signed)
   Subjective:     HPI    C/o stress, anxiety, anxiety and fatigue x 2 weeks.   Kristin Torres had a lot of travel, stress since April H/oSVT episode ((?coffe induced) Oct 2-3, 2015 in [place] (4 hrs)- was hospitalized; elevated enzymes). Doing well now s/p cardioversion 04/25/14. Cardizem and ASA were stopped after cardioversion.  F/u LLQ pain w/exercise x years   She had a heart cath, ECHO.   BP Readings from Last 3 Encounters:  09/27/14 112/80  05/25/14 120/74  05/22/14 144/88   Wt Readings from Last 3 Encounters:  09/27/14 148 lb (67.132 kg)  05/25/14 146 lb (66.225 kg)  05/22/14 149 lb (67.586 kg)       Review of Systems  Constitutional: Negative for chills, activity change, appetite change, fatigue and unexpected weight change.  HENT: Negative for congestion, mouth sores and sinus pressure.   Eyes: Negative for visual disturbance.  Respiratory: Negative for apnea, cough, chest tightness, shortness of breath and wheezing.   Cardiovascular: Negative for leg swelling.  Gastrointestinal: Negative for nausea and abdominal pain.  Genitourinary: Negative for frequency, difficulty urinating and vaginal pain.  Musculoskeletal: Negative for back pain and gait problem.  Skin: Negative for pallor and rash.  Neurological: Negative for dizziness, tremors, weakness, numbness and headaches.  Psychiatric/Behavioral: Negative for suicidal ideas, confusion and sleep disturbance.   BP 112/80 mmHg  Pulse 68  Wt 148 lb (67.132 kg)  SpO2 97%     Objective:   Physical Exam  Constitutional: She appears well-developed. No distress.  HENT:  Head: Normocephalic.  Right Ear: External ear normal.  Left Ear: External ear normal.  Nose: Nose normal.  Mouth/Throat: Oropharynx is clear and moist.  Eyes: Conjunctivae are normal. Pupils are equal, round, and reactive to light. Right eye exhibits no discharge. Left eye exhibits no discharge.  Neck: Normal range of motion. Neck supple. No JVD present.  No tracheal deviation present. No thyromegaly present.  Cardiovascular: Normal rate, regular rhythm and normal heart sounds.   Pulmonary/Chest: No stridor. No respiratory distress. She has no wheezes.  Abdominal: Soft. Bowel sounds are normal. She exhibits no distension and no mass. There is no tenderness. There is no rebound and no guarding.  Musculoskeletal: She exhibits no edema or tenderness.  Lymphadenopathy:    She has no cervical adenopathy.  Neurological: She displays normal reflexes. No cranial nerve deficit. She exhibits normal muscle tone. Coordination normal.  Skin: No rash noted. No erythema.  Psychiatric: She has a normal mood and affect. Her behavior is normal. Judgment and thought content normal.   EKG NSR Orthostatic BP - no drop  Heart Cath ok      Assessment & Plan:

## 2014-09-27 NOTE — Telephone Encounter (Signed)
Left detailed mess on home/mobile numbers informing pt of below. I also called pt's husband, Broadus John. No answer

## 2014-09-27 NOTE — Assessment & Plan Note (Signed)
On Diltiazem 

## 2014-09-27 NOTE — Telephone Encounter (Signed)
Ok 1 pm

## 2014-09-27 NOTE — Assessment & Plan Note (Signed)
On Losartan HCT, Diltiazem

## 2014-09-27 NOTE — Progress Notes (Signed)
Pre visit review using our clinic review tool, if applicable. No additional management support is needed unless otherwise documented below in the visit note. 

## 2014-09-27 NOTE — Telephone Encounter (Signed)
Pt seen in office today. See 09/27/14 OV note.

## 2014-09-28 ENCOUNTER — Encounter: Payer: Self-pay | Admitting: Internal Medicine

## 2014-09-28 ENCOUNTER — Ambulatory Visit: Payer: Managed Care, Other (non HMO) | Admitting: Internal Medicine

## 2014-10-31 ENCOUNTER — Other Ambulatory Visit: Payer: Self-pay | Admitting: Gynecology

## 2014-11-01 LAB — CYTOLOGY - PAP

## 2014-11-09 ENCOUNTER — Ambulatory Visit: Payer: Managed Care, Other (non HMO) | Admitting: Internal Medicine

## 2014-11-24 ENCOUNTER — Encounter: Payer: Self-pay | Admitting: Internal Medicine

## 2014-11-30 ENCOUNTER — Ambulatory Visit: Payer: Managed Care, Other (non HMO) | Admitting: Internal Medicine

## 2015-01-22 ENCOUNTER — Telehealth: Payer: Self-pay | Admitting: *Deleted

## 2015-01-22 NOTE — Telephone Encounter (Signed)
Entered in error

## 2015-05-02 ENCOUNTER — Encounter: Payer: Self-pay | Admitting: Podiatry

## 2015-05-02 ENCOUNTER — Ambulatory Visit (INDEPENDENT_AMBULATORY_CARE_PROVIDER_SITE_OTHER): Payer: Managed Care, Other (non HMO) | Admitting: Podiatry

## 2015-05-02 VITALS — Resp 16 | Ht 65.0 in | Wt 145.0 lb

## 2015-05-02 DIAGNOSIS — B351 Tinea unguium: Secondary | ICD-10-CM

## 2015-05-02 NOTE — Progress Notes (Signed)
   Subjective:    Patient ID: Kristin Torres, female    DOB: [birth year], 65 y.o.   MRN: [id number]  HPI Patient presents with bilateral nail problem. Pt stated, "needs all nails to be checked for nail fungus"; Wants to discuss laser treatment.  Pt has taken Lamisil in the past and it did not help.    Review of Systems  All other systems reviewed and are negative.      Objective:   Physical Exam        Assessment & Plan:

## 2015-05-03 ENCOUNTER — Encounter: Payer: Self-pay | Admitting: Internal Medicine

## 2015-05-03 ENCOUNTER — Ambulatory Visit (INDEPENDENT_AMBULATORY_CARE_PROVIDER_SITE_OTHER): Payer: Managed Care, Other (non HMO) | Admitting: Internal Medicine

## 2015-05-03 VITALS — BP 128/70 | HR 71

## 2015-05-03 DIAGNOSIS — R0789 Other chest pain: Secondary | ICD-10-CM

## 2015-05-03 DIAGNOSIS — F4321 Adjustment disorder with depressed mood: Secondary | ICD-10-CM

## 2015-05-03 DIAGNOSIS — I10 Essential (primary) hypertension: Secondary | ICD-10-CM | POA: Diagnosis not present

## 2015-05-03 DIAGNOSIS — G4489 Other headache syndrome: Secondary | ICD-10-CM

## 2015-05-03 DIAGNOSIS — R002 Palpitations: Secondary | ICD-10-CM | POA: Insufficient documentation

## 2015-05-03 DIAGNOSIS — R51 Headache: Secondary | ICD-10-CM

## 2015-05-03 DIAGNOSIS — R519 Headache, unspecified: Secondary | ICD-10-CM | POA: Insufficient documentation

## 2015-05-03 NOTE — Assessment & Plan Note (Signed)
Today BP could be elevated due to HA and stress Take Advil, Lorazepam prn rest

## 2015-05-03 NOTE — Progress Notes (Signed)
Pre visit review using our clinic review tool, if applicable. No additional management support is needed unless otherwise documented below in the visit note. 

## 2015-05-03 NOTE — Progress Notes (Signed)
Subjective:  Patient ID: Kristin Torres, female    DOB: 14-Feb-1951  Age: 65 y.o. MRN: [id number]  CC: No chief complaint on file.   HPI Kristin Torres was worked in for chest pressure and palpitations when she was [place] the [place] home: mom died this am. Pt did not sleep last night and a few nights prior. C/o HA  Outpatient Prescriptions Prior to Visit  Medication Sig Dispense Refill  . aspirin (ASPIRIN CHILDRENS) 81 MG chewable tablet Chew 1 tablet (81 mg total) by mouth daily. 100 tablet 11  . cholecalciferol (VITAMIN D) 1000 UNITS tablet Take 1,000 Units by mouth daily.    Marland Kitchen diltiazem (DILACOR XR) 180 MG 24 hr capsule Take 1 capsule (180 mg total) by mouth daily. 90 capsule 3  . EPINEPHrine (EPIPEN) 0.3 mg/0.3 mL SOAJ injection Inject 0.3 mLs (0.3 mg total) into the muscle once. 2 Device 2  . fexofenadine (ALLEGRA) 180 MG tablet Take 180 mg by mouth daily as needed. allergies     . FLUoxetine (PROZAC) 10 MG tablet Take 1 tablet (10 mg total) by mouth daily. 30 tablet 6  . LORazepam (ATIVAN) 0.5 MG tablet Take 1 tablet (0.5 mg total) by mouth 2 (two) times daily as needed for anxiety. 30 tablet 3  . losartan-hydrochlorothiazide (HYZAAR) 50-12.5 MG per tablet Take 1 tablet by mouth daily. 90 tablet 3  . zaleplon (SONATA) 10 MG capsule Take 1 capsule (10 mg total) by mouth at bedtime as needed for sleep. 90 capsule 1   No facility-administered medications prior to visit.    ROS Review of Systems  Constitutional: Negative for chills, activity change, appetite change, fatigue and unexpected weight change.  HENT: Negative for congestion, mouth sores and sinus pressure.   Eyes: Negative for visual disturbance.  Respiratory: Negative for cough and chest tightness.   Cardiovascular: Positive for chest pain and palpitations.  Gastrointestinal: Negative for nausea and abdominal pain.  Genitourinary: Negative for frequency, difficulty urinating and vaginal pain.  Musculoskeletal: Negative for  back pain and gait problem.  Skin: Negative for pallor and rash.  Neurological: Negative for dizziness, tremors, weakness, numbness and headaches.  Psychiatric/Behavioral: Negative for confusion and sleep disturbance.    Objective:  BP 128/70 mmHg  Pulse 71  SpO2 97%  BP Readings from Last 3 Encounters:  05/03/15 128/70  09/27/14 118/72  05/25/14 120/74    Wt Readings from Last 3 Encounters:  05/02/15 145 lb (65.772 kg)  09/27/14 148 lb (67.132 kg)  05/25/14 146 lb (66.225 kg)    Physical Exam  Constitutional: She appears well-developed. No distress.  HENT:  Head: Normocephalic.  Right Ear: External ear normal.  Left Ear: External ear normal.  Nose: Nose normal.  Mouth/Throat: Oropharynx is clear and moist.  Eyes: Conjunctivae are normal. Pupils are equal, round, and reactive to light. Right eye exhibits no discharge. Left eye exhibits no discharge.  Neck: Normal range of motion. Neck supple. No JVD present. No tracheal deviation present. No thyromegaly present.  Cardiovascular: Normal rate, regular rhythm and normal heart sounds.   Pulmonary/Chest: No stridor. No respiratory distress. She has no wheezes.  Abdominal: Soft. Bowel sounds are normal. She exhibits no distension and no mass. There is no tenderness. There is no rebound and no guarding.  Musculoskeletal: She exhibits no edema or tenderness.  Lymphadenopathy:    She has no cervical adenopathy.  Neurological: She displays normal reflexes. No cranial nerve deficit. She exhibits normal muscle tone. Coordination normal.  Skin: No rash  noted. No erythema.  Psychiatric: She has a normal mood and affect. Her behavior is normal. Judgment and thought content normal.  Looks tired  EKG NSR  Lab Results  Component Value Date   WBC 5.5 09/27/2014   HGB 12.6 09/27/2014   HCT 37.4 09/27/2014   PLT 234.0 09/27/2014   GLUCOSE 95 09/27/2014   CHOL 210* 09/27/2014   TRIG 64.0 09/27/2014   HDL 68.00 09/27/2014   LDLCALC  129* 09/27/2014   ALT 20 09/27/2014   AST 19 09/27/2014   NA 137 09/27/2014   K 4.3 09/27/2014   CL 101 09/27/2014   CREATININE 0.99 09/27/2014   BUN 22 09/27/2014   CO2 33* 09/27/2014   TSH 0.73 09/27/2014   HGBA1C 5.5 09/27/2014    US Abdomen Complete  02/27/2014  CLINICAL DATA:  Left upper quadrant pain EXAM: ULTRASOUND ABDOMEN COMPLETE COMPARISON:  None. FINDINGS: Gallbladder: No gallstones or wall thickening visualized. No sonographic Murphy sign noted. Common bile duct: Diameter: 3.3 mm Liver: Echogenic lesion in the right lobe of liver measures 7 x 7 x 8 mm. IVC: No abnormality visualized. Pancreas: Visualized portion unremarkable. Spleen: Size and appearance within normal limits. Right Kidney: Length: 10.9 cm. Echogenicity within normal limits. No mass or hydronephrosis visualized. Left Kidney: Length: 10.2 cm. Echogenicity within normal limits. No mass or hydronephrosis visualized. Abdominal aorta: No aneurysm visualized. Other findings: None. IMPRESSION: 1. No acute findings identified. 2. 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 In the absence of known malignancy a followup examination in 6 months with ultrasound is recommended to confirm stability and bus benignity. If there is a history of known malignancy then further assessment with contrast enhanced liver MRI would be recommended. Electronically Signed   By: Kerby Moors M.D.   On: 02/27/2014 09:12    Assessment & Plan:   Diagnoses and all orders for this visit:  Palpitations -     EKG 12-Lead   I am having Ms. Turk maintain her fexofenadine, EPINEPHrine, cholecalciferol, aspirin, LORazepam, diltiazem, losartan-hydrochlorothiazide, zaleplon, and FLUoxetine.  No orders of the defined types were placed in this encounter.     Follow-up: Return for a follow-up visit.  Walker Kehr, MD

## 2015-05-03 NOTE — Assessment & Plan Note (Signed)
May 09, 2015 mother died Discussed

## 2015-05-03 NOTE — Assessment & Plan Note (Signed)
05/03/15 stress related mother died: EKG OK Resolved Lorazepam prn

## 2015-05-05 NOTE — Progress Notes (Signed)
Subjective:     Patient ID: Kristin Torres, female   DOB: Jun 29, 1950, 65 y.o.   MRN: [id number]  HPI patient presents stating that she was concerned about her right big toenail and that she is taking Lamisil in the past which did not help but she also may have traumatized the nailbed   Review of Systems     Objective:   Physical Exam Neurovascular status found to be intact with patient having discoloration on the right big toenail localized in nature with moderate looseness and also slight discoloration indicative of probable trauma to the nailbed    Assessment:     Does not appear to be systemic but appear to be is localized trauma with possible fungal infiltration right hallux    Plan:     H&P condition reviewed and at this point we discussed treatment options and I do not recommend laser or oral and less this were to get worse or not grow properly. She will begin topical and will utilize this for the next 4 months and if symptoms persist we will consider more aggressive treatment options and I educated her on that fact today

## 2015-05-07 DIAGNOSIS — Z7689 Persons encountering health services in other specified circumstances: Secondary | ICD-10-CM

## 2015-05-07 IMAGING — US US ABDOMEN COMPLETE
1 series · 14 of 25 positions shown · non-contrast
Comparison: None.

CLINICAL DATA: Left upper quadrant pain

EXAM:
ULTRASOUND ABDOMEN COMPLETE

[Series 1: us abdomen complete · 0.22mm/px · 14 of 78 slices shown]
[im 1/78]
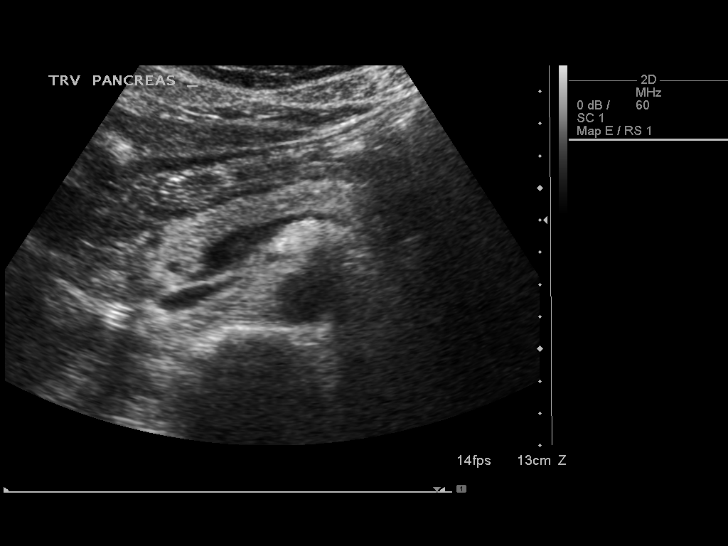
[im 7/78]
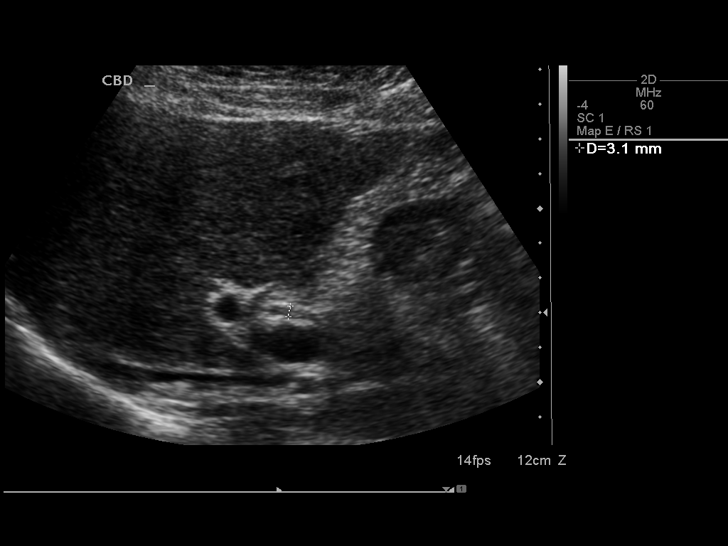
[im 13/78]
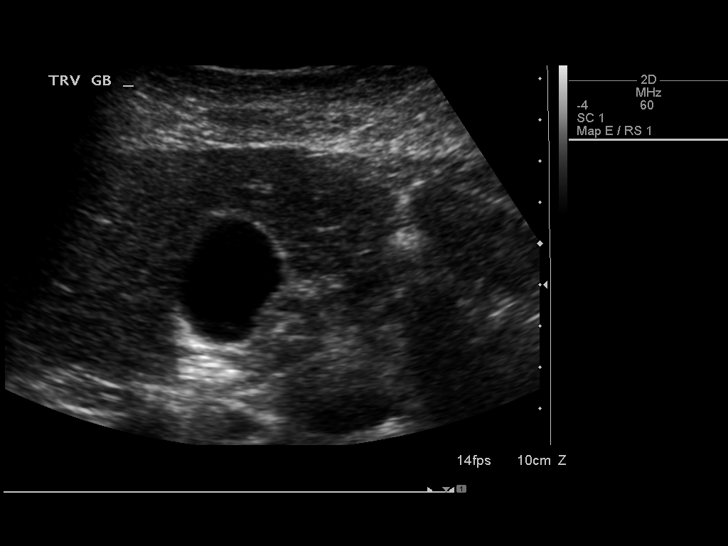
[im 20/78]
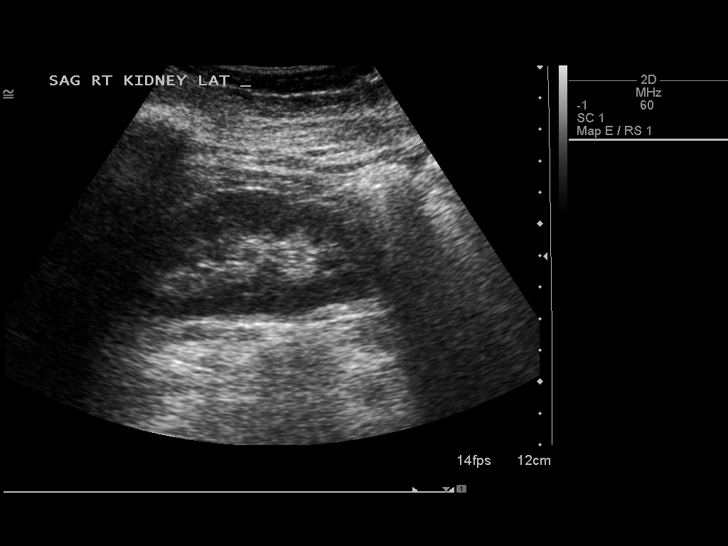
[im 26/78]
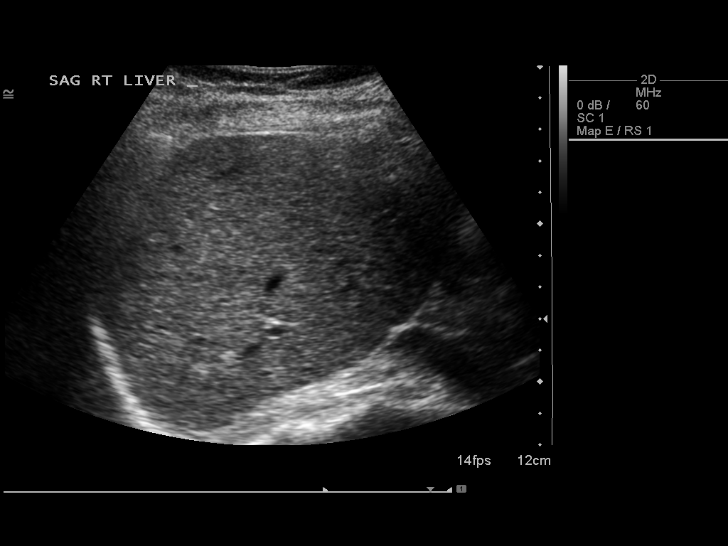
[im 29/78]
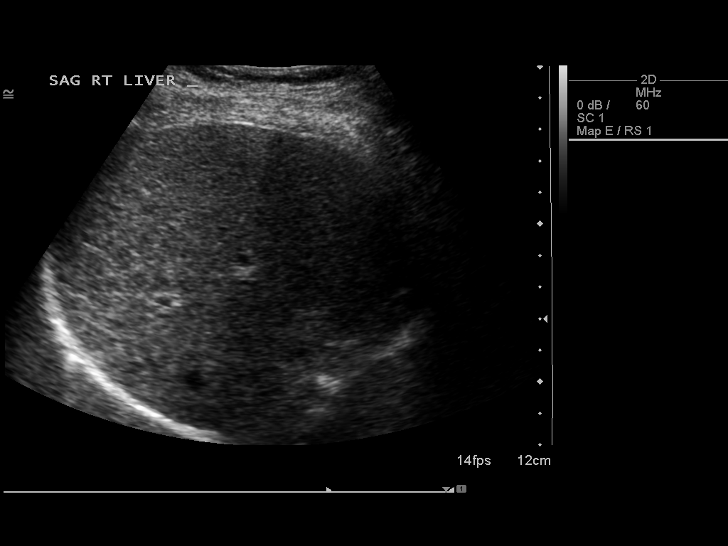
[im 36/78]
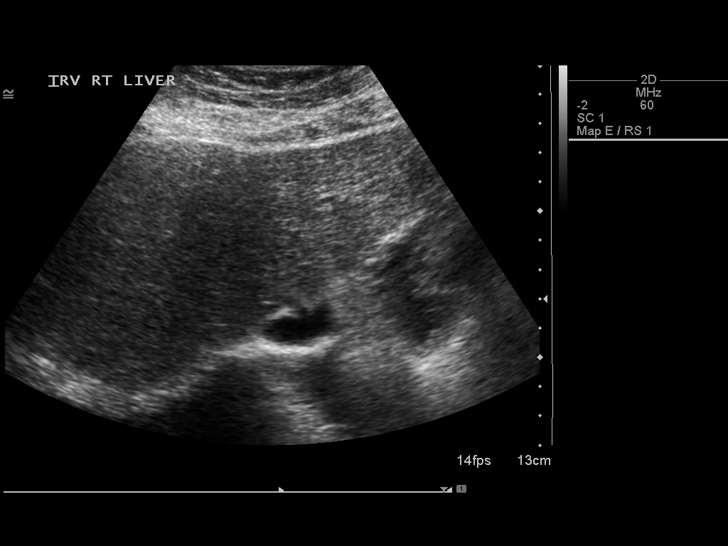
[im 42/78]
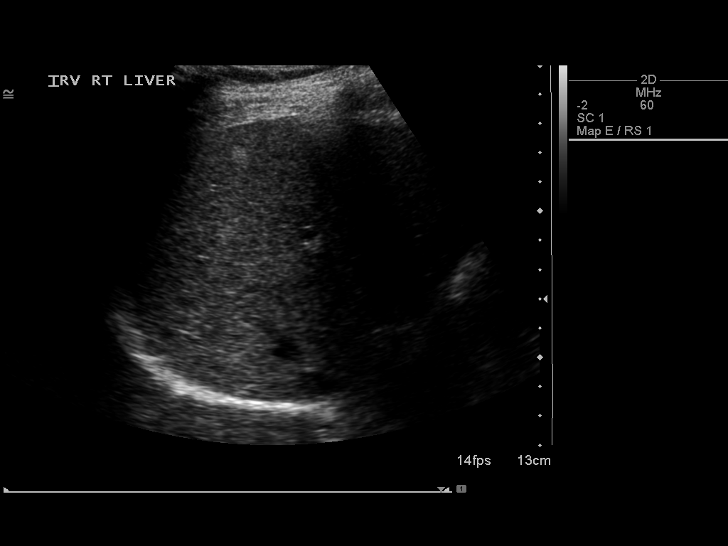
[im 49/78]
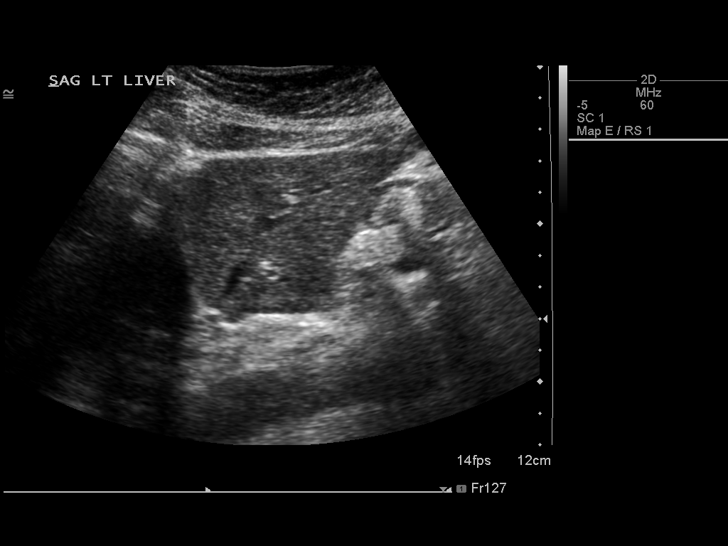
[im 52/78]
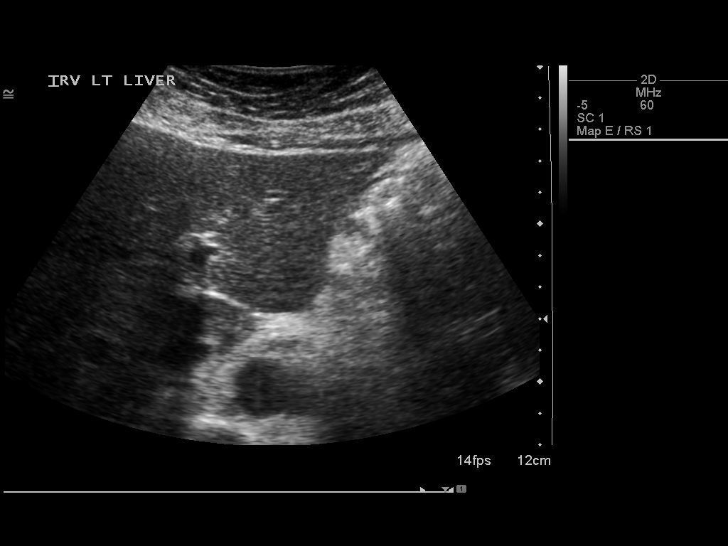
[im 58/78]
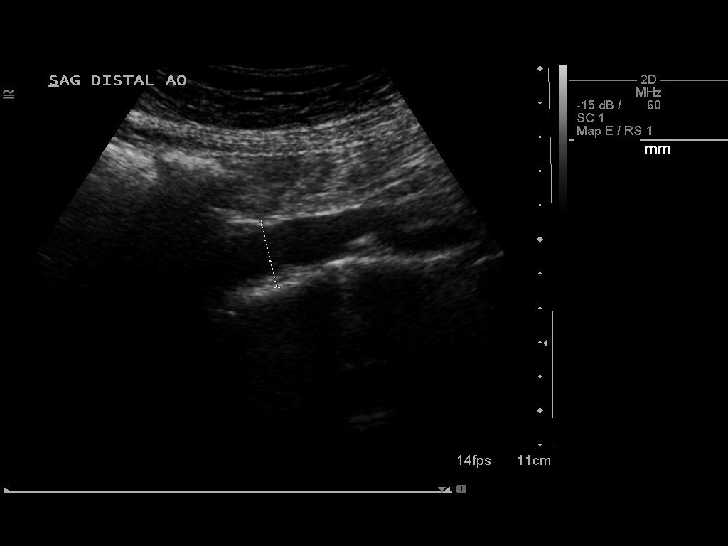
[im 65/78]
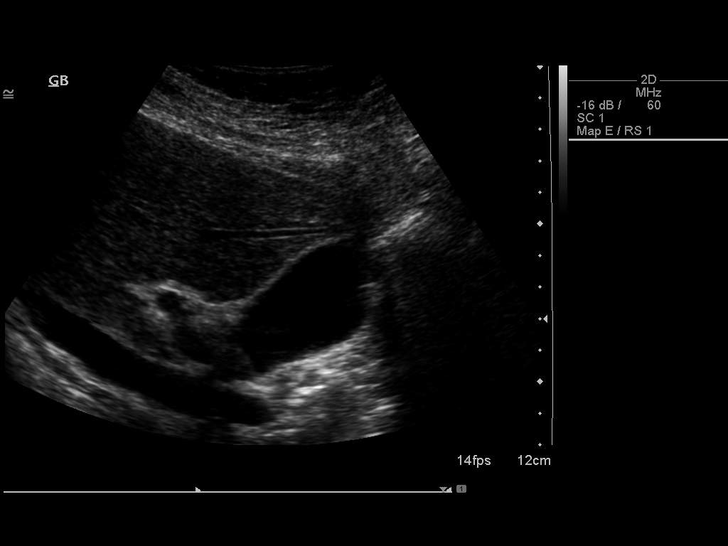
[im 71/78]
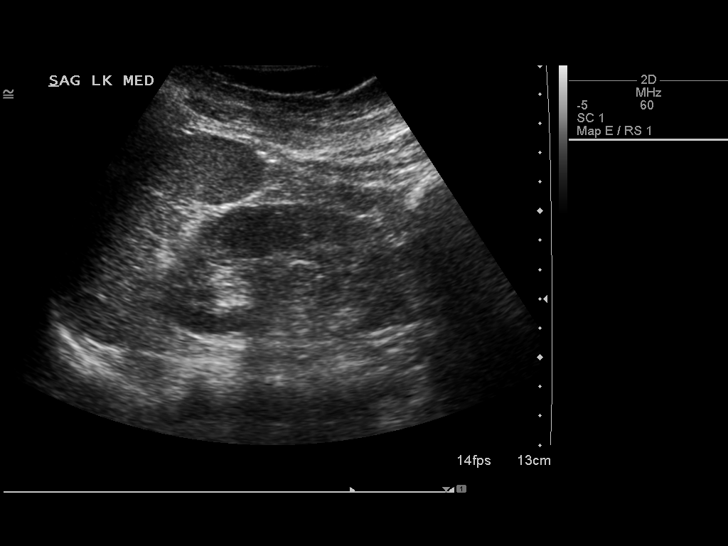
[im 78/78]
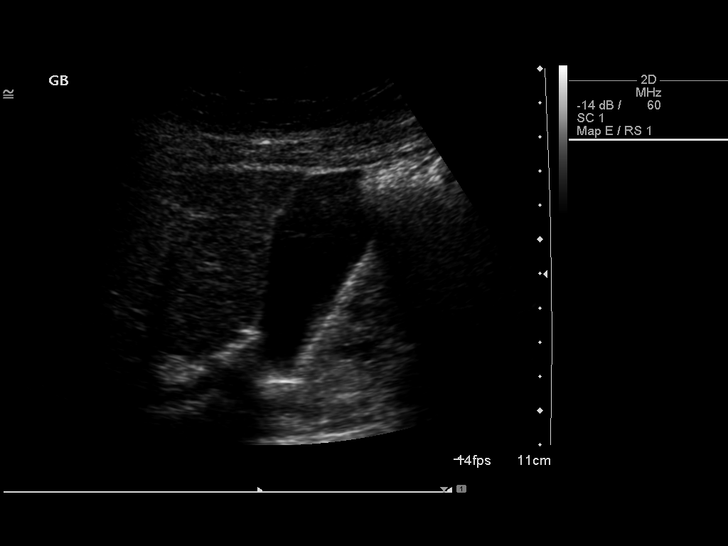

[14 of 25 positions shown; findings below may reference images not displayed]

FINDINGS: Gallbladder: No gallstones or wall thickening visualized. No
sonographic Murphy sign noted.

Common bile duct: Diameter: 3.3 mm

Liver: Echogenic lesion in the right lobe of liver measures 7 x 7 x
8 mm.

IVC: No abnormality visualized.

Pancreas: Visualized portion unremarkable.

Spleen: Size and appearance within normal limits.

Right Kidney: Length: 10.9 cm. Echogenicity within normal limits. No
mass or hydronephrosis visualized.

Left Kidney: Length: 10.2 cm. Echogenicity within normal limits. No
mass or hydronephrosis visualized.

Abdominal aorta: No aneurysm visualized.

Other findings: None.
IMPRESSION: 1. No acute findings identified.
2. Echogenic structure in the right hepatic lobe is favored to
represent a benign hemangioma. In the absence of known malignancy a
followup examination in 6 months with ultrasound is recommended to
confirm stability and bus benignity. If there is a history of known
malignancy then further assessment with contrast enhanced liver MRI
would be recommended.

## 2015-05-11 ENCOUNTER — Encounter: Payer: Self-pay | Admitting: Family Medicine

## 2015-05-11 ENCOUNTER — Ambulatory Visit (INDEPENDENT_AMBULATORY_CARE_PROVIDER_SITE_OTHER): Payer: Managed Care, Other (non HMO) | Admitting: Family Medicine

## 2015-05-11 VITALS — BP 112/70 | HR 74 | Temp 98.4°F | Resp 18 | Ht 65.0 in | Wt 148.0 lb

## 2015-05-11 DIAGNOSIS — J069 Acute upper respiratory infection, unspecified: Secondary | ICD-10-CM | POA: Diagnosis not present

## 2015-05-11 MED ORDER — BENZONATATE 100 MG PO CAPS: 100.0000 mg | ORAL_CAPSULE | Freq: Three times a day (TID) | ORAL | Status: DC | PRN

## 2015-05-11 NOTE — Progress Notes (Signed)
Pre visit review using our clinic review tool, if applicable. No additional management support is needed unless otherwise documented below in the visit note. 

## 2015-05-11 NOTE — Progress Notes (Signed)
HPI:  URI: -started: 2 days ago -symptoms:nasal congestion, scratchy throat, cough -denies:fever, SOB, NVD, tooth pain, sinus pain -has tried:  nothing -sick contacts/travel/risks: denies flu exposure, grandkids with a cold  ROS: See pertinent positives and negatives per HPI.  Past Medical History  Diagnosis Date  . Tubular adenoma of colon   . SVT (supraventricular tachycardia) ([place])   . Cervicalgia   . Chronic back pain   . Personal history of other diseases of circulatory system   . Leg cramps   . Dermatophytosis of nail   . Sinusitis   . History of pneumonia   . MI (myocardial infarction) ([place])   . PSVT (paroxysmal supraventricular tachycardia) ([place])   . Nocturia     Past Surgical History  Procedure Laterality Date  . Breast excisional biopsy Right 2011    benign  right breast  . Polypectomy    . Oophorectomy    . Appendectomy    . Ablation of dysrhythmic focus  04/25/2014    svt  . Supraventricular tachycardia ablation N/A 04/25/2014    Procedure: SUPRAVENTRICULAR TACHYCARDIA ABLATION;  Surgeon: Evans Lance, MD;  Location: [facility] CATH LAB;  Service: Cardiovascular;  Laterality: N/A;    Family History  Problem Relation Age of Onset  . Stomach cancer Paternal Grandmother   . Breast cancer Maternal Grandmother   . Diabetes Maternal Grandmother   . Diabetes Mother   . Heart attack Father 46  . Hypertension Mother     Social History   Social History  . Marital Status: Married    Spouse Name: N/A  . Number of Children: 2  . Years of Education: N/A   Occupational History  . microbiologist    Social History Main Topics  . Smoking status: Never Smoker   . Smokeless tobacco: Never Used  . Alcohol Use: 3.6 oz/week    6 Glasses of wine per week  . Drug Use: No  . Sexual Activity: Not Asked   Other Topics Concern  . 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  aspirin (ASPIRIN CHILDRENS) 81 MG chewable tablet, Chew 1 tablet (81 mg total) by mouth daily., Disp: 100 tablet, Rfl: 11 .  cholecalciferol (VITAMIN D) 1000 UNITS tablet, Take 1,000 Units by mouth daily., Disp: , Rfl:  .  diltiazem (DILACOR XR) 180 MG 24 hr capsule, Take 1 capsule (180 mg total) by mouth daily., Disp: 90 capsule, Rfl: 3 .  EPINEPHrine (EPIPEN) 0.3 mg/0.3 mL SOAJ injection, Inject 0.3 mLs (0.3 mg total) into the muscle once., Disp: 2 Device, Rfl: 2 .  fexofenadine (ALLEGRA) 180 MG tablet, Take 180 mg by mouth daily as needed. allergies , Disp: , Rfl:  .  FLUoxetine (PROZAC) 10 MG tablet, Take 1 tablet (10 mg total) by mouth daily., Disp: 30 tablet, Rfl: 6 .  LORazepam (ATIVAN) 0.5 MG tablet, Take 1 tablet (0.5 mg total) by mouth 2 (two) times daily as needed for anxiety., Disp: 30 tablet, Rfl: 3 .  losartan-hydrochlorothiazide (HYZAAR) 50-12.5 MG per tablet, Take 1 tablet by mouth daily., Disp: 90 tablet, Rfl: 3 .  zaleplon (SONATA) 10 MG capsule, Take 1 capsule (10 mg total) by mouth at bedtime as needed for sleep., Disp: 90 capsule,  Rfl: 1 .  benzonatate (TESSALON PERLES) 100 MG capsule, Take 1 capsule (100 mg total) by mouth 3 (three) times daily as needed., Disp: 20 capsule, Rfl: 0  EXAM:  Filed Vitals:   05/11/15 1037  BP: 112/70  Pulse: 74  Temp: 98.4 F (36.9 C)  Resp: 18    Body mass index is 24.63 kg/(m^2).  GENERAL: vitals reviewed and listed above, alert, oriented, appears well hydrated and in no acute distress  HEENT: atraumatic, conjunttiva clear, no obvious abnormalities on inspection of external nose and ears, normal appearance of ear canals and TMs, clear nasal congestion, mild post oropharyngeal erythema with PND, no tonsillar edema or exudate, no sinus TTP  NECK: no obvious masses on inspection  LUNGS: clear to auscultation  bilaterally, no wheezes, rales or rhonchi, good air movement  CV: HRRR, no peripheral edema  MS: moves all extremities without noticeable abnormality  PSYCH: pleasant and cooperative, no obvious depression or anxiety  ASSESSMENT AND PLAN:  Discussed the following assessment and plan:  Acute upper respiratory infection  -given HPI and exam findings today, a serious infection or illness is unlikely. We discussed potential etiologies, with VURI being most likely, and advised supportive care and monitoring. We discussed treatment side effects, likely course, antibiotic misuse, transmission, and signs of developing a serious illness. -of course, we advised to return or notify a doctor immediately if symptoms worsen or persist or new concerns arise.    Patient Instructions  INSTRUCTIONS FOR UPPER RESPIRATORY INFECTION:  -plenty of rest and fluids  -nasal saline wash 2-3 times daily (use prepackaged nasal saline or bottled/distilled water if making your own)   -can use AFRIN nasal spray for drainage and nasal congestion - but do NOT use longer then 3-4 days  -can use tylenol (in no history of liver disease) or ibuprofen (if no history of kidney disease, bowel bleeding or significant heart disease) as directed for aches and sorethroat  -in the winter time, using a humidifier at night is helpful (please follow cleaning instructions)  -if you are taking a cough medication - use only as directed, may also try a teaspoon of honey to coat the throat and throat lozenges. If given a cough medication with codeine or hydrocodone or other narcotic please be advised that this contains a strong and  potentially addicting medication. Please follow instructions carefully, take as little as possible and only use AS NEEDED for severe cough. Discuss potential side effects with your pharmacy. Please do not drive or operate machinery while taking these types of medications. Please do not take other sedating  medications, drugs or alcohol while taking this medication without discussing with your doctor.  -for sore throat, salt water gargles can help  -follow up if you have fevers, facial pain, tooth pain, difficulty breathing or are worsening or symptoms persist longer then expected  Upper Respiratory Infection, Adult An upper respiratory infection (URI) is also known as the common cold. It is often caused by a type of germ (virus). Colds are easily spread (contagious). You can pass it to others by kissing, coughing, sneezing, or drinking out of the same glass. Usually, you get better in 1 to 3  weeks.  However, the cough can last for even longer. HOME CARE   Only take medicine as told by your doctor. Follow instructions provided above.  Drink enough water and fluids to keep your pee (urine) clear or pale yellow.  Get plenty of rest.  Return to work when your temperature is <  100 for 24 hours or as told by your doctor. You may use a face mask and wash your hands to stop your cold from spreading. GET HELP RIGHT AWAY IF:   After the first few days, you feel you are getting worse.  You have questions about your medicine.  You have chills, shortness of breath, or red spit (mucus).  You have pain in the face for more then 1-2 days, especially when you bend forward.  You have a fever, puffy (swollen) neck, pain when you swallow, or white spots in the back of your throat.  You have a bad headache, ear pain, sinus pain, or chest pain.  You have a high-pitched whistling sound when you breathe in and out (wheezing).  You cough up blood.  You have sore muscles or a stiff neck. MAKE SURE YOU:   Understand these instructions.  Will watch your condition.  Will get help right away if you are not doing well or get worse. Document Released: 09/23/2007 Document Revised: 06/29/2011 Document Reviewed: 07/12/2013 [facility] Patient Information 2015 [place], [place]. This information is not intended to  replace advice given to you by your health care provider. Make sure you discuss any questions you have with your health care provider.      Kristin Benton R.

## 2015-05-11 NOTE — Patient Instructions (Signed)

## 2015-05-13 ENCOUNTER — Telehealth: Payer: Self-pay | Admitting: Internal Medicine

## 2015-05-13 NOTE — Telephone Encounter (Signed)
Pt request to speak to the assistant concern about paper work that she needs fill out. Pt stated her mom was since she had to take care of her that's why she was not able to go on her vacation. She is trying to recover the tickets that she bought for her vacation and she need Dr. Camila Li to fill out paper work for her but she want to speak to the assistant first. Please give her a call.

## 2015-05-16 NOTE — Telephone Encounter (Signed)
Left mess for patient to call back.  

## 2015-06-05 NOTE — Telephone Encounter (Signed)
Closing phone note pt never return call...Kristin Torres

## 2015-06-12 ENCOUNTER — Encounter: Payer: Self-pay | Admitting: Internal Medicine

## 2015-06-14 LAB — HM MAMMOGRAPHY

## 2015-06-17 ENCOUNTER — Encounter: Payer: Self-pay | Admitting: Internal Medicine

## 2015-06-30 ENCOUNTER — Other Ambulatory Visit: Payer: Self-pay | Admitting: Internal Medicine

## 2015-07-12 ENCOUNTER — Ambulatory Visit: Payer: Managed Care, Other (non HMO) | Admitting: Internal Medicine

## 2015-08-26 ENCOUNTER — Telehealth: Payer: Self-pay

## 2015-08-26 NOTE — Telephone Encounter (Signed)
Spoke to pt, rescheduled her for 5.11.17.

## 2015-08-26 NOTE — Telephone Encounter (Signed)
Patient states Dr. Alain Marion wanted her to see Dr. Tamala Julian for both of her knees, she is having pain. She rides a bike and is leaving on may 26 for a bike ride. The first new patient app you had was may 23. She took the app. But would really like to be seen sooner not days before she is leaving. In case there is things she can be doing to help. Please advise or follow up

## 2015-08-29 ENCOUNTER — Ambulatory Visit (INDEPENDENT_AMBULATORY_CARE_PROVIDER_SITE_OTHER): Payer: Managed Care, Other (non HMO) | Admitting: Family Medicine

## 2015-08-29 ENCOUNTER — Encounter: Payer: Self-pay | Admitting: Family Medicine

## 2015-08-29 ENCOUNTER — Other Ambulatory Visit: Payer: Self-pay

## 2015-08-29 VITALS — BP 118/80 | HR 76 | Wt 149.0 lb

## 2015-08-29 DIAGNOSIS — M222X1 Patellofemoral disorders, right knee: Secondary | ICD-10-CM | POA: Insufficient documentation

## 2015-08-29 DIAGNOSIS — M222X2 Patellofemoral disorders, left knee: Secondary | ICD-10-CM

## 2015-08-29 DIAGNOSIS — M25562 Pain in left knee: Principal | ICD-10-CM

## 2015-08-29 DIAGNOSIS — R252 Cramp and spasm: Secondary | ICD-10-CM

## 2015-08-29 DIAGNOSIS — M25561 Pain in right knee: Secondary | ICD-10-CM

## 2015-08-29 DIAGNOSIS — M7631 Iliotibial band syndrome, right leg: Secondary | ICD-10-CM | POA: Diagnosis not present

## 2015-08-29 DIAGNOSIS — M7632 Iliotibial band syndrome, left leg: Secondary | ICD-10-CM

## 2015-08-29 MED ORDER — DICLOFENAC SODIUM 2 % TD SOLN: TRANSDERMAL | Status: DC

## 2015-08-29 MED ORDER — GABAPENTIN 100 MG PO CAPS: 100.0000 mg | ORAL_CAPSULE | Freq: Every day | ORAL | Status: DC

## 2015-08-29 NOTE — Assessment & Plan Note (Signed)
Plantar Fascitis: We reviewed that stretching is critically important to the treatment of PF. Reviewed footwear. Rigid soles have been shown to help with PF. Night splints can help. Reviewed rehab of stretching and calf raises.  Could benefit from a corticosteroid injection, orthotics, or other measures if conservative treatment fails. Discussed changing patient's by C. Prescription for topical anti-inflammatories given. Differential also includes possible radiculopathy. Started on gabapentin. Follow-up in 4 weeks for further evaluation and treatment.

## 2015-08-29 NOTE — Assessment & Plan Note (Signed)
I reviewed with the patient the anatomy involved with ITB syndrome.  Given rehab program  - primarily ITB stretching program, core stability program, gluteus medius strengthening, hip flexion strengthening, and core. Additional proprioception and mild plyometrics program reviewed.  Reviewed plan of care and rehab is the primary treatment in this condition. rtc in 4 weeks

## 2015-08-29 NOTE — Patient Instructions (Addendum)
Good to see you  Ice 20 minutes 2 times daily. Usually after activity and before bed. pennsaid pinkie amount topically 2 times daily as needed.  Exercises 3 times a week.  Tell your trainer to focus on hip abductors, stretch IT band and to strengthen VMO.  Continue the vitamin D Iron 65 mg daily with 500mg  of vitamin C will help with the cramping.  Gabapentin 100mg  at night will help with the nerve pain  Raise seat on your bike a little.  See me again after your trip.

## 2015-08-29 NOTE — Assessment & Plan Note (Signed)
Discussed icing protocol as well as iron supple mentation.

## 2015-08-29 NOTE — Progress Notes (Signed)
[facility] [address] [place], [place] [address] Phone: [phone] Subjective:    I'm seeing this patient by the request  of:  Walker Kehr, MD   CC: Bilateral knee pain  [id number] Kristin Torres is a 65 y.o. female coming in with complaint of  Bilateral knee pain. Seems to be worsening over the course last several weeks. Does not really true injury but has been by grading much more often. Patient states that she is getting ready for a very long biking tour in the next 2 weeks. Patient states that the pain is mostly on the anterior aspect of the knees. Seems to radiate to the lateral aspect. Goes up her legs some. Mostly on the right some on the left. States that certain shoes can make the pain worse as well when she is walking now. Denies any pain at night. Denies any radiation past the knees. Rates the severity of pain though is 8 out of 10. No swelling that she has noticed and no numbness. Does respond well to anti-inflammatories.    Past Medical History  Diagnosis Date  . Tubular adenoma of colon   . SVT (supraventricular tachycardia) ([place])   . Cervicalgia   . Chronic back pain   . Personal history of other diseases of circulatory system   . Leg cramps   . Dermatophytosis of nail   . Sinusitis   . History of pneumonia   . MI (myocardial infarction) ([place])   . PSVT (paroxysmal supraventricular tachycardia) ([place])   . Nocturia    Past Surgical History  Procedure Laterality Date  . Breast excisional biopsy Right 2011    benign  right breast  . Polypectomy    . Oophorectomy    . Appendectomy    . Ablation of dysrhythmic focus  04/25/2014    svt  . Supraventricular tachycardia ablation N/A 04/25/2014    Procedure: SUPRAVENTRICULAR TACHYCARDIA ABLATION;  Surgeon: Evans Lance, MD;  Location: [facility] CATH LAB;  Service: Cardiovascular;  Laterality: N/A;   Social History   Social History  . Marital Status: Married    Spouse Name: N/A  .  Number of Children: 2  . Years of Education: N/A   Occupational History  . microbiologist    Social History Main Topics  . Smoking status: Never Smoker   . Smokeless tobacco: Never Used  . Alcohol Use: 3.6 oz/week    6 Glasses of wine per week  . Drug Use: No  . Sexual Activity: Not Asked   Other Topics Concern  . 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 Bee Venom Anaphylaxis  . Streptomycin Rash  . Mango Flavor Swelling  . Caffeine Palpitations  . Penicillins Rash   Family History  Problem Relation Age of Onset  . Stomach cancer Paternal Grandmother   . Breast cancer Maternal Grandmother   . Diabetes Maternal Grandmother   . Diabetes Mother   . Heart attack Father 62  . Hypertension Mother     Past medical history, social, surgical and family history all reviewed in electronic medical record.  No pertanent information unless stated regarding to the chief complaint.   Review of Systems: No headache, visual changes, nausea, vomiting, diarrhea,  constipation, dizziness, abdominal pain, skin rash, fevers, chills, night sweats, weight loss, swollen lymph nodes, body aches, joint swelling, muscle aches, chest pain, shortness of breath, mood changes.   Objective Blood pressure 118/80, pulse 76, weight 149 lb (67.586 kg).  General: No apparent distress alert and oriented x3 mood and affect normal, dressed appropriately.  HEENT: Pupils equal, extraocular movements intact  Respiratory: Patient's speak in full sentences and does not appear short of breath  Cardiovascular: No lower extremity edema, non tender, no erythema  Skin: Warm dry intact with no signs of infection or rash on extremities or on axial skeleton.  Abdomen: Soft nontender  Neuro: Cranial nerves II through  XII are intact, neurovascularly intact in all extremities with 2+ DTRs and 2+ pulses.  Lymph: No lymphadenopathy of posterior or anterior cervical chain or axillae bilaterally.  Gait normal with good balance and coordination.  MSK:  Non tender with full range of motion and good stability and symmetric strength and tone of shoulders, elbows, wrist, hip, and ankles bilaterally.   Knee: Bilateral Normal to inspection with no erythema or effusion or obvious bony abnormalities. Mild lateral tracking of the knees bilaterally Mild pain over the patellofemoral joint ROM full in flexion and extension and lower leg rotation. Ligaments with solid consistent endpoints including ACL, PCL, LCL, MCL. Negative Mcmurray's, Apley's, and Thessalonian tests. Mild painful patellar compression. Patellar glide with moderate crepitus. Right greater than left Patellar and quadriceps tendons unremarkable. Hamstring and quadriceps strength is normal.  Patient also has significant tightness of the iliotibial band bilaterally with some tenderness distally.  MSK US performed of: Right This study was ordered, performed, and interpreted by Charlann Boxer D.O.  Knee: All structures visualized. Patient does have narrowing of the patellofemoral joint laterally. Patient also has some mild hypoechoic changes between the patella in the femoral area. Patient though medial and lateral joint space seems to be unremarkable. Meniscus are intact with no tears Patellar Tendon unremarkable on long and transverse views without effusion. No abnormality of prepatellar bursa. LCL and MCL unremarkable on long and transverse views. No abnormality of origin of medial or lateral head of the gastrocnemius.  IMPRESSION:  Mild patellofemoral arthritis  Procedure note [id number]; 15 minutes spent for Therapeutic exercises as stated in above notes.  This included exercises focusing on stretching, strengthening, with significant focus on eccentric  aspects.  Patellofemoral Syndrome  Reviewed anatomy using anatomical model and how PFS occurs.  Given rehab exercises handout for VMO, hip abductors, core, entire kinetic chain including proprioception exercises including cone touches, step downs, hip elevations and turn outs.  Could benefit from PT, regular exercise, upright biking, and a PFS knee brace to assist with tracking abnormalities.   Proper technique shown and discussed handout in great detail with ATC.  All questions were discussed and answered.      Impression and Recommendations:     This case required medical decision making of moderate complexity.      Note: This dictation was prepared with Dragon dictation along with smaller phrase technology. Any transcriptional errors that result from this process are unintentional.

## 2015-09-02 ENCOUNTER — Telehealth: Payer: Self-pay | Admitting: Family Medicine

## 2015-09-02 NOTE — Telephone Encounter (Signed)
lmovm for pt to return call.  

## 2015-09-02 NOTE — Telephone Encounter (Signed)
Pt wants to speak to the assistant, pt stated Dr. Tamala Julian gave her device to keep her knee from moving, she wants to return it but want to speak to the assistant. Please call her back

## 2015-09-03 NOTE — Telephone Encounter (Signed)
Spoke to the pt & advised her to bring the brace by the office & we'll take care of it. Pt stated she will bring the brace back tomorrow morning.

## 2015-09-10 ENCOUNTER — Ambulatory Visit: Payer: Managed Care, Other (non HMO) | Admitting: Family Medicine

## 2015-10-04 DIAGNOSIS — H43811 Vitreous degeneration, right eye: Secondary | ICD-10-CM | POA: Insufficient documentation

## 2015-10-10 ENCOUNTER — Telehealth: Payer: Self-pay | Admitting: Internal Medicine

## 2015-10-10 MED ORDER — SUVOREXANT 15 MG PO TABS: 15.0000 mg | ORAL_TABLET | Freq: Every evening | ORAL | Status: DC | PRN

## 2015-10-10 NOTE — Telephone Encounter (Signed)
Patient is requesting different sleep medication.  States current med is not working anymore.

## 2015-10-10 NOTE — Telephone Encounter (Signed)
Ok Belsomra. Pick up Rx and a package pls Thx

## 2015-10-11 NOTE — Telephone Encounter (Signed)
Left detailed mess informing pt.  

## 2015-10-18 ENCOUNTER — Encounter: Payer: Self-pay | Admitting: Gastroenterology

## 2015-10-25 NOTE — Telephone Encounter (Signed)
Rec vm from [facility] advising possible interaction between Belsomra and Diltiazem. Please advise Is it ok to fill Belsomra?

## 2015-10-28 NOTE — Telephone Encounter (Signed)
Left detailed mess informing pt.  

## 2015-10-28 NOTE — Addendum Note (Signed)
Addended by: Cassandria Anger on: 10/28/2015 09:31 AM   Modules accepted: Orders, Medications

## 2015-10-28 NOTE — Telephone Encounter (Signed)
Start Belsomra 1/2 a day at night Increase to 1 at hs if no side effects (daytime sleepiness0 Thx

## 2015-10-28 NOTE — Telephone Encounter (Signed)
Left detailed mess informing pharmacy.

## 2015-11-19 ENCOUNTER — Encounter: Payer: Self-pay | Admitting: Gastroenterology

## 2015-11-27 ENCOUNTER — Ambulatory Visit (AMBULATORY_SURGERY_CENTER): Payer: Self-pay

## 2015-11-27 VITALS — Ht 66.0 in | Wt 150.4 lb

## 2015-11-27 DIAGNOSIS — Z860100 Personal history of colon polyps, unspecified: Secondary | ICD-10-CM

## 2015-11-27 DIAGNOSIS — Z1211 Encounter for screening for malignant neoplasm of colon: Secondary | ICD-10-CM

## 2015-11-27 MED ORDER — SUPREP BOWEL PREP KIT 17.5-3.13-1.6 GM/177ML PO SOLN: 1.0000 | Freq: Once | ORAL | 0 refills | Status: AC

## 2015-12-03 ENCOUNTER — Encounter: Payer: Self-pay | Admitting: Gastroenterology

## 2015-12-11 ENCOUNTER — Encounter: Payer: Managed Care, Other (non HMO) | Admitting: Gastroenterology

## 2015-12-16 ENCOUNTER — Ambulatory Visit (AMBULATORY_SURGERY_CENTER): Payer: Managed Care, Other (non HMO) | Admitting: Gastroenterology

## 2015-12-16 ENCOUNTER — Encounter: Payer: Self-pay | Admitting: Gastroenterology

## 2015-12-16 VITALS — BP 108/64 | HR 65 | Temp 97.8°F | Resp 13 | Ht 66.0 in | Wt 150.0 lb

## 2015-12-16 DIAGNOSIS — Z86010 Personal history of colonic polyps: Secondary | ICD-10-CM

## 2015-12-16 MED ORDER — SODIUM CHLORIDE 0.9 % IV SOLN: 500.0000 mL | INTRAVENOUS | Status: DC

## 2015-12-16 NOTE — Progress Notes (Signed)
A/ox3, pleased with MAC, report to RN 

## 2015-12-16 NOTE — Patient Instructions (Signed)
Impression/recommendations:  Normal colonoscopy Repeat colonoscopy in 10 years.  YOU HAD AN ENDOSCOPIC PROCEDURE TODAY AT THE [facility]:   Refer to the procedure report that was given to you for any specific questions about what was found during the examination.  If the procedure report does not answer your questions, please call your gastroenterologist to clarify.  If you requested that your care partner not be given the details of your procedure findings, then the procedure report has been included in a sealed envelope for you to review at your convenience later.  YOU SHOULD EXPECT: Some feelings of bloating in the abdomen. Passage of more gas than usual.  Walking can help get rid of the air that was put into your GI tract during the procedure and reduce the bloating. If you had a lower endoscopy (such as a colonoscopy or flexible sigmoidoscopy) you may notice spotting of blood in your stool or on the toilet paper. If you underwent a bowel prep for your procedure, you may not have a normal bowel movement for a few days.  Please Note:  You might notice some irritation and congestion in your nose or some drainage.  This is from the oxygen used during your procedure.  There is no need for concern and it should clear up in a day or so.  SYMPTOMS TO REPORT IMMEDIATELY:   Following lower endoscopy (colonoscopy or flexible sigmoidoscopy):  Excessive amounts of blood in the stool  Significant tenderness or worsening of abdominal pains  Swelling of the abdomen that is new, acute  Fever of 100F or higher  For urgent or emergent issues, a gastroenterologist can be reached at any hour by calling [phone].   DIET:  We do recommend a small meal at first, but then you may proceed to your regular diet.  Drink plenty of fluids but you should avoid alcoholic beverages for 24 hours.  ACTIVITY:  You should plan to take it easy for the rest of today and you should NOT DRIVE or use heavy  machinery until tomorrow (because of the sedation medicines used during the test).    FOLLOW UP: Our staff will call the number listed on your records the next business day following your procedure to check on you and address any questions or concerns that you may have regarding the information given to you following your procedure. If we do not reach you, we will leave a message.  However, if you are feeling well and you are not experiencing any problems, there is no need to return our call.  We will assume that you have returned to your regular daily activities without incident.  If any biopsies were taken you will be contacted by phone or by letter within the next 1-3 weeks.  Please call us at [phone] if you have not heard about the biopsies in 3 weeks.    SIGNATURES/CONFIDENTIALITY: You and/or your care partner have signed paperwork which will be entered into your electronic medical record.  These signatures attest to the fact that that the information above on your After Visit Summary has been reviewed and is understood.  Full responsibility of the confidentiality of this discharge information lies with you and/or your care-partner. 

## 2015-12-16 NOTE — Op Note (Signed)
[place] Patient Name: Kristin Torres Procedure Date: 12/16/2015 9:02 AM MRN: [id number] Endoscopist: Mallie Mussel L. Loletha Carrow , MD Age: 65 Referring MD:  Date of Birth: [birth year] Gender: Female Account #: [id number] Procedure:                Colonoscopy Indications:              High risk colon cancer surveillance: Personal                            history of colonic polyps (< 1cm TA 2004, no polyps                            in 2007) Medicines:                Monitored Anesthesia Care Procedure:                Pre-Anesthesia Assessment:                           - Prior to the procedure, a History and Physical                            was performed, and patient medications and                            allergies were reviewed. The patient's tolerance of                            previous anesthesia was also reviewed. The risks                            and benefits of the procedure and the sedation                            options and risks were discussed with the patient.                            All questions were answered, and informed consent                            was obtained. Prior Anticoagulants: The patient has                            taken no previous anticoagulant or antiplatelet                            agents. ASA Grade Assessment: II - A patient with                            mild systemic disease. After reviewing the risks                            and benefits, the patient was deemed in  satisfactory condition to undergo the procedure.                           After obtaining informed consent, the colonoscope                            was passed under direct vision. Throughout the                            procedure, the patient's blood pressure, pulse, and                            oxygen saturations were monitored continuously. The                            Model CF-HQ190L [id number]) scope was introduced                           through the anus and advanced to the the cecum,                            identified by appendiceal orifice and ileocecal                            valve. The colonoscopy was performed without                            difficulty. The patient tolerated the procedure                            well. The quality of the bowel preparation was                            good. The ileocecal valve, appendiceal orifice, and                            rectum were photographed. The bowel preparation                            used was SUPREP. The quality of the bowel                            preparation was evaluated using the BBPS [facility]                            Bowel Preparation Scale) with scores of: Right                            Colon = 2, Transverse Colon = 2 and Left Colon = 2.                            The total BBPS score equals 6. Scope In: 9:13:08 AM Scope Out: 9:26:32 AM Scope Withdrawal Time: 0 hours 10 minutes 42 seconds  Total Procedure Duration: 0 hours 13 minutes 24 seconds  Findings:                 The perianal and digital rectal examinations were                            normal.                           The entire examined colon appeared normal on direct                            and retroflexion views. Complications:            No immediate complications. Estimated Blood Loss:     Estimated blood loss: none. Impression:               - The entire examined colon is normal on direct and                            retroflexion views.                           - No specimens collected. Recommendation:           - Patient has a contact number available for                            emergencies. The signs and symptoms of potential                            delayed complications were discussed with the                            patient. Return to normal activities tomorrow.                            Written discharge instructions were  provided to the                            patient.                           - Resume previous diet.                           - Continue present medications.                           - Repeat colonoscopy in 10 years for surveillance. Marilea Gwynne L. Loletha Carrow, MD 12/16/2015 9:33:27 AM This report has been signed electronically.

## 2015-12-17 ENCOUNTER — Telehealth: Payer: Self-pay

## 2015-12-17 NOTE — Telephone Encounter (Signed)
  Follow up Call-  Call back number 12/16/2015  Post procedure Call Back phone  # [phone]  Permission to leave phone message Yes  Some recent data might be hidden     Patient questions:  Do you have a fever, pain , or abdominal swelling? No. Pain Score  0 *  Have you tolerated food without any problems? Yes.    Have you been able to return to your normal activities? Yes.    Do you have any questions about your discharge instructions: Diet   No. Medications  No. Follow up visit  No.  Do you have questions or concerns about your Care? No.  Actions: * If pain score is 4 or above: No action needed, pain <4.

## 2015-12-24 DIAGNOSIS — Z83511 Family history of glaucoma: Secondary | ICD-10-CM | POA: Insufficient documentation

## 2015-12-24 DIAGNOSIS — H5213 Myopia, bilateral: Secondary | ICD-10-CM | POA: Insufficient documentation

## 2015-12-24 DIAGNOSIS — H52223 Regular astigmatism, bilateral: Secondary | ICD-10-CM | POA: Insufficient documentation

## 2015-12-24 DIAGNOSIS — H43391 Other vitreous opacities, right eye: Secondary | ICD-10-CM | POA: Insufficient documentation

## 2015-12-24 DIAGNOSIS — H35432 Paving stone degeneration of retina, left eye: Secondary | ICD-10-CM | POA: Insufficient documentation

## 2015-12-28 ENCOUNTER — Other Ambulatory Visit: Payer: Self-pay | Admitting: Internal Medicine

## 2016-01-02 ENCOUNTER — Other Ambulatory Visit: Payer: Self-pay | Admitting: Internal Medicine

## 2016-01-08 NOTE — Telephone Encounter (Signed)
Done

## 2016-04-07 ENCOUNTER — Encounter: Payer: Self-pay | Admitting: Internal Medicine

## 2016-04-07 ENCOUNTER — Ambulatory Visit (INDEPENDENT_AMBULATORY_CARE_PROVIDER_SITE_OTHER): Payer: Managed Care, Other (non HMO) | Admitting: Internal Medicine

## 2016-04-07 DIAGNOSIS — R002 Palpitations: Secondary | ICD-10-CM | POA: Diagnosis not present

## 2016-04-07 DIAGNOSIS — Z0000 Encounter for general adult medical examination without abnormal findings: Secondary | ICD-10-CM

## 2016-04-07 DIAGNOSIS — Z91038 Other insect allergy status: Secondary | ICD-10-CM

## 2016-04-07 DIAGNOSIS — Z91030 Bee allergy status: Secondary | ICD-10-CM

## 2016-04-07 MED ORDER — DIPHENHYDRAMINE HCL 25 MG PO TABS: 50.0000 mg | ORAL_TABLET | Freq: Four times a day (QID) | ORAL | 0 refills | Status: DC | PRN

## 2016-04-07 MED ORDER — PREDNISONE 10 MG PO TABS: ORAL_TABLET | ORAL | 1 refills | Status: DC

## 2016-04-07 NOTE — Progress Notes (Signed)
Subjective:  Patient ID: Kristin Torres, female    DOB: 11/15/1950  Age: 65 y.o. MRN: [id number]  CC: Annual Exam   HPI [name] presents for a well exam  Outpatient Medications Prior to Visit  Medication Sig Dispense Refill  . aspirin (ASPIRIN CHILDRENS) 81 MG chewable tablet Chew 1 tablet (81 mg total) by mouth daily. 100 tablet 11  . Biotin (BIOTIN 5000) 5 MG CAPS Take by mouth.    . cholecalciferol (VITAMIN D) 1000 UNITS tablet Take 1,000 Units by mouth daily.    Marland Kitchen diltiazem (CARDIZEM CD) 180 MG 24 hr capsule TAKE 1 CAPSULE (180 MG TOTAL) BY MOUTH DAILY. 90 capsule 2  . EPINEPHrine (EPIPEN) 0.3 mg/0.3 mL SOAJ injection Inject 0.3 mLs (0.3 mg total) into the muscle once. 2 Device 2  . fexofenadine (ALLEGRA) 180 MG tablet Take 180 mg by mouth daily as needed. allergies     . gabapentin (NEURONTIN) 100 MG capsule Take 1 capsule (100 mg total) by mouth at bedtime. 30 capsule 3  . LORazepam (ATIVAN) 0.5 MG tablet Take 1 tablet (0.5 mg total) by mouth 2 (two) times daily as needed for anxiety. 30 tablet 3  . losartan-hydrochlorothiazide (HYZAAR) 50-12.5 MG tablet TAKE 1 TABLET BY MOUTH DAILY. 90 tablet 3  . zaleplon (SONATA) 10 MG capsule take 1 capsule by mouth at bedtime if needed for sleep 90 capsule 1  . Suvorexant (BELSOMRA) 15 MG TABS Take 15 mg by mouth at bedtime as needed. (Patient not taking: Reported on 04/07/2016) 30 tablet 2   Facility-Administered Medications Prior to Visit  Medication Dose Route Frequency Provider Last Rate Last Dose  . 0.9 %  sodium chloride infusion  500 mL Intravenous Continuous Nelida Meuse III, MD        ROS Review of Systems  Constitutional: Negative for activity change, appetite change, chills, fatigue and unexpected weight change.  HENT: Negative for congestion, mouth sores and sinus pressure.   Eyes: Negative for visual disturbance.  Respiratory: Negative for cough and chest tightness.   Cardiovascular: Positive for palpitations.    Gastrointestinal: Negative for abdominal pain and nausea.  Genitourinary: Negative for difficulty urinating, frequency and vaginal pain.  Musculoskeletal: Negative for back pain and gait problem.  Skin: Negative for pallor and rash.  Neurological: Negative for dizziness, tremors, weakness, numbness and headaches.  Psychiatric/Behavioral: Negative for confusion and sleep disturbance.    Objective:  Ht 5\' 6"  (1.676 m)   Wt 144 lb (65.3 kg)   BMI 23.24 kg/m   BP Readings from Last 3 Encounters:  12/16/15 108/64  08/29/15 118/80  05/11/15 112/70    Wt Readings from Last 3 Encounters:  04/07/16 144 lb (65.3 kg)  12/16/15 150 lb (68 kg)  11/27/15 150 lb 6.4 oz (68.2 kg)    Physical Exam  Constitutional: She appears well-developed. No distress.  HENT:  Head: Normocephalic.  Right Ear: External ear normal.  Left Ear: External ear normal.  Nose: Nose normal.  Mouth/Throat: Oropharynx is clear and moist.  Eyes: Conjunctivae are normal. Pupils are equal, round, and reactive to light. Right eye exhibits no discharge. Left eye exhibits no discharge.  Neck: Normal range of motion. Neck supple. No JVD present. No tracheal deviation present. No thyromegaly present.  Cardiovascular: Normal rate, regular rhythm and normal heart sounds.   Pulmonary/Chest: No stridor. No respiratory distress. She has no wheezes.  Abdominal: Soft. Bowel sounds are normal. She exhibits no distension and no mass. There is no tenderness. There  is no rebound and no guarding.  Musculoskeletal: She exhibits no edema or tenderness.  Lymphadenopathy:    She has no cervical adenopathy.  Neurological: She displays normal reflexes. No cranial nerve deficit. She exhibits normal muscle tone. Coordination normal.  Skin: No rash noted. No erythema.  Psychiatric: She has a normal mood and affect. Her behavior is normal. Judgment and thought content normal.    Lab Results  Component Value Date   WBC 5.5 09/27/2014    HGB 12.6 09/27/2014   HCT 37.4 09/27/2014   PLT 234.0 09/27/2014   GLUCOSE 95 09/27/2014   CHOL 210 (H) 09/27/2014   TRIG 64.0 09/27/2014   HDL 68.00 09/27/2014   LDLCALC 129 (H) 09/27/2014   ALT 20 09/27/2014   AST 19 09/27/2014   NA 137 09/27/2014   K 4.3 09/27/2014   CL 101 09/27/2014   CREATININE 0.99 09/27/2014   BUN 22 09/27/2014   CO2 33 (H) 09/27/2014   TSH 0.73 09/27/2014   HGBA1C 5.5 09/27/2014    US Abdomen Complete  Result Date: 02/27/2014 CLINICAL DATA:  Left upper quadrant pain EXAM: ULTRASOUND ABDOMEN COMPLETE COMPARISON:  None. FINDINGS: Gallbladder: No gallstones or wall thickening visualized. No sonographic Murphy sign noted. Common bile duct: Diameter: 3.3 mm Liver: Echogenic lesion in the right lobe of liver measures 7 x 7 x 8 mm. IVC: No abnormality visualized. Pancreas: Visualized portion unremarkable. Spleen: Size and appearance within normal limits. Right Kidney: Length: 10.9 cm. Echogenicity within normal limits. No mass or hydronephrosis visualized. Left Kidney: Length: 10.2 cm. Echogenicity within normal limits. No mass or hydronephrosis visualized. Abdominal aorta: No aneurysm visualized. Other findings: None. IMPRESSION: 1. No acute findings identified. 2. 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 In the absence of known malignancy a followup examination in 6 months with ultrasound is recommended to confirm stability and bus benignity. If there is a history of known malignancy then further assessment with contrast enhanced liver MRI would be recommended. Electronically Signed   By: Kerby Moors M.D.   On: 02/27/2014 09:12    Assessment & Plan:   There are no diagnoses linked to this encounter. I am having Ms. Devol maintain her fexofenadine, EPINEPHrine, cholecalciferol, aspirin, LORazepam, gabapentin, Suvorexant, Biotin, losartan-hydrochlorothiazide, diltiazem, and zaleplon. We will continue to administer  sodium chloride.  No orders of the defined types were placed in this encounter.    Follow-up: No Follow-up on file.  Walker Kehr, MD

## 2016-04-07 NOTE — Assessment & Plan Note (Addendum)
We discussed age appropriate health related issues, including available/recomended screening tests and vaccinations. We discussed a need for adhering to healthy diet and exercise. Labs ordered. All questions were answered. 

## 2016-04-07 NOTE — Progress Notes (Signed)
Pre visit review using our clinic review tool, if applicable. No additional management support is needed unless otherwise documented below in the visit note. 

## 2016-04-09 ENCOUNTER — Other Ambulatory Visit (INDEPENDENT_AMBULATORY_CARE_PROVIDER_SITE_OTHER): Payer: Managed Care, Other (non HMO)

## 2016-04-09 DIAGNOSIS — Z0000 Encounter for general adult medical examination without abnormal findings: Secondary | ICD-10-CM | POA: Diagnosis not present

## 2016-04-09 LAB — LIPID PANEL
CHOLESTEROL: 199 mg/dL (ref 0–200)
HDL: 64.3 mg/dL (ref >39.00)
LDL Cholesterol: 127 mg/dL — ABNORMAL HIGH (ref 0–99)
NonHDL: 134.53
TRIGLYCERIDES: 40 mg/dL (ref 0.0–149.0)
Total CHOL/HDL Ratio: 3
VLDL: 8 mg/dL (ref 0.0–40.0)

## 2016-04-09 LAB — URINALYSIS, ROUTINE W REFLEX MICROSCOPIC
Bilirubin Urine: NEGATIVE
KETONES UR: NEGATIVE
NITRITE: NEGATIVE
Specific Gravity, Urine: 1.01 (ref 1.000–1.030)
Total Protein, Urine: NEGATIVE
URINE GLUCOSE: NEGATIVE
UROBILINOGEN UA: 0.2 (ref 0.0–1.0)
pH: 6.5 (ref 5.0–8.0)

## 2016-04-09 LAB — CBC WITH DIFFERENTIAL/PLATELET
Basophils Absolute: 0 10*3/uL (ref 0.0–0.1)
Basophils Relative: 0.7 % (ref 0.0–3.0)
EOS PCT: 4.8 % (ref 0.0–5.0)
Eosinophils Absolute: 0.3 10*3/uL (ref 0.0–0.7)
HCT: 38.6 % (ref 36.0–46.0)
Hemoglobin: 13.1 g/dL (ref 12.0–15.0)
LYMPHS ABS: 2.5 10*3/uL (ref 0.7–4.0)
Lymphocytes Relative: 44.2 % (ref 12.0–46.0)
MCHC: 34.1 g/dL (ref 30.0–36.0)
MCV: 95.9 fl (ref 78.0–100.0)
MONO ABS: 0.3 10*3/uL (ref 0.1–1.0)
MONOS PCT: 5.5 % (ref 3.0–12.0)
NEUTROS ABS: 2.5 10*3/uL (ref 1.4–7.7)
NEUTROS PCT: 44.8 % (ref 43.0–77.0)
PLATELETS: 239 10*3/uL (ref 150.0–400.0)
RBC: 4.02 Mil/uL (ref 3.87–5.11)
RDW: 12 % (ref 11.5–15.5)
WBC: 5.6 10*3/uL (ref 4.0–10.5)

## 2016-04-09 LAB — BASIC METABOLIC PANEL
BUN: 19 mg/dL (ref 6–23)
CO2: 33 meq/L — AB (ref 19–32)
Calcium: 9.7 mg/dL (ref 8.4–10.5)
Chloride: 103 mEq/L (ref 96–112)
Creatinine, Ser: 0.79 mg/dL (ref 0.40–1.20)
GFR: 78.03 mL/min (ref >60.00)
GLUCOSE: 101 mg/dL — AB (ref 70–99)
POTASSIUM: 4.4 meq/L (ref 3.5–5.1)
Sodium: 140 mEq/L (ref 135–145)

## 2016-04-09 LAB — HEPATIC FUNCTION PANEL
ALBUMIN: 4.3 g/dL (ref 3.5–5.2)
ALT: 26 U/L (ref 0–35)
AST: 25 U/L (ref 0–37)
Alkaline Phosphatase: 84 U/L (ref 39–117)
BILIRUBIN TOTAL: 0.3 mg/dL (ref 0.2–1.2)
Bilirubin, Direct: 0.1 mg/dL (ref 0.0–0.3)
Total Protein: 7.5 g/dL (ref 6.0–8.3)

## 2016-04-09 LAB — TSH: TSH: 2.2 u[IU]/mL (ref 0.35–4.50)

## 2016-04-09 NOTE — Assessment & Plan Note (Signed)
Epipen prn Benadryl, Prednisone in Ziploc bags to use prn

## 2016-04-09 NOTE — Assessment & Plan Note (Signed)
Doing well 

## 2016-04-10 MED ORDER — CIPROFLOXACIN HCL 250 MG PO TABS: 250.0000 mg | ORAL_TABLET | Freq: Two times a day (BID) | ORAL | 0 refills | Status: DC

## 2016-04-30 ENCOUNTER — Telehealth: Payer: Self-pay | Admitting: *Deleted

## 2016-04-30 DIAGNOSIS — R300 Dysuria: Secondary | ICD-10-CM

## 2016-04-30 NOTE — Telephone Encounter (Signed)
Rec'd call pt states back in Dec MD sent antibiotic to her pharmacy and she wanted to know the reason why. Inform ot per chart MD sent her a my-chart msg inform her about her labs, and he stated that she may havve a borderline UTI, and that's why the cipro was sent. Pt states she wasn't having any symptoms, and she did not take the antibiotic. She is wanting to repeat the UA. Is this ok...Kristin Torres

## 2016-05-01 NOTE — Telephone Encounter (Signed)
Ok to repeat UA Thx

## 2016-05-01 NOTE — Telephone Encounter (Signed)
Per dr Alain Marion, patient needs to come to lab at her convenience for repeat ua---I have entered orders and called patient to advise

## 2016-05-04 ENCOUNTER — Other Ambulatory Visit (INDEPENDENT_AMBULATORY_CARE_PROVIDER_SITE_OTHER): Payer: Managed Care, Other (non HMO)

## 2016-05-04 DIAGNOSIS — R300 Dysuria: Secondary | ICD-10-CM | POA: Diagnosis not present

## 2016-05-04 LAB — URINALYSIS, ROUTINE W REFLEX MICROSCOPIC
BILIRUBIN URINE: NEGATIVE
HGB URINE DIPSTICK: NEGATIVE
KETONES UR: NEGATIVE
Leukocytes, UA: NEGATIVE
NITRITE: NEGATIVE
RBC / HPF: NONE SEEN (ref 0–?)
Specific Gravity, Urine: 1.02 (ref 1.000–1.030)
Total Protein, Urine: NEGATIVE
URINE GLUCOSE: NEGATIVE
Urobilinogen, UA: 0.2 (ref 0.0–1.0)
WBC UA: NONE SEEN (ref 0–?)
pH: 5.5 (ref 5.0–8.0)

## 2016-05-06 LAB — CULTURE, URINE COMPREHENSIVE: ORGANISM ID, BACTERIA: NO GROWTH

## 2016-06-15 LAB — HM MAMMOGRAPHY

## 2016-06-22 ENCOUNTER — Telehealth: Payer: Managed Care, Other (non HMO) | Admitting: Family

## 2016-06-22 DIAGNOSIS — B9689 Other specified bacterial agents as the cause of diseases classified elsewhere: Secondary | ICD-10-CM

## 2016-06-22 DIAGNOSIS — J028 Acute pharyngitis due to other specified organisms: Secondary | ICD-10-CM

## 2016-06-22 DIAGNOSIS — H109 Unspecified conjunctivitis: Secondary | ICD-10-CM

## 2016-06-22 MED ORDER — AZITHROMYCIN 250 MG PO TABS: ORAL_TABLET | ORAL | 0 refills | Status: DC

## 2016-06-22 MED ORDER — BENZONATATE 100 MG PO CAPS: 100.0000 mg | ORAL_CAPSULE | Freq: Three times a day (TID) | ORAL | 0 refills | Status: DC | PRN

## 2016-06-22 MED ORDER — POLYMYXIN B-TRIMETHOPRIM 10000-0.1 UNIT/ML-% OP SOLN: 1.0000 [drp] | OPHTHALMIC | 0 refills | Status: DC

## 2016-06-22 NOTE — Progress Notes (Signed)
We are sorry that you are not feeling well.  Here is how we plan to help!  *Based on our detailed telephone conversation, the following assessment is given:   Based on what you have shared with me it looks like you have upper respiratory tract inflammation that has resulted in a significant cough.  Inflammation and infection in the upper respiratory tract is commonly called bronchitis and has four common causes:  Allergies, Viral Infections, Acid Reflux and Bacterial Infections.  Allergies, viruses and acid reflux are treated by controlling symptoms or eliminating the cause. An example might be a cough caused by taking certain blood pressure medications. You stop the cough by changing the medication. Another example might be a cough caused by acid reflux. Controlling the reflux helps control the cough.  Based on your presentation I believe you most likely have A cough due to bacteria.  When patients have a fever and a productive cough with a change in color or increased sputum production, we are concerned about bacterial bronchitis.  If left untreated it can progress to pneumonia.  If your symptoms do not improve with your treatment plan it is important that you contact your provider.   I have prescribed Azithromyin 250 mg: two tables now and then one tablet daily for 4 additonal days    In addition you may use A non-prescription cough medication called Mucinex DM: take 2 tablets every 12 hours. and A prescription cough medication called Tessalon Perles 100mg . You may take 1-2 capsules every 8 hours as needed for your cough.   USE OF BRONCHODILATOR ("RESCUE") INHALERS: There is a risk from using your bronchodilator too frequently.  The risk is that over-reliance on a medication which only relaxes the muscles surrounding the breathing tubes can reduce the effectiveness of medications prescribed to reduce swelling and congestion of the tubes themselves.  Although you feel brief relief from the  bronchodilator inhaler, your asthma may actually be worsening with the tubes becoming more swollen and filled with mucus.  This can delay other crucial treatments, such as oral steroid medications. If you need to use a bronchodilator inhaler daily, several times per day, you should discuss this with your provider.  There are probably better treatments that could be used to keep your asthma under control.     HOME CARE . Only take medications as instructed by your medical team. . Complete the entire course of an antibiotic. . Drink plenty of fluids and get plenty of rest. . Avoid close contacts especially the very young and the elderly . Cover your mouth if you cough or cough into your sleeve. . Always remember to wash your hands . A steam or ultrasonic humidifier can help congestion.   GET HELP RIGHT AWAY IF: . You develop worsening fever. . You become short of breath . You cough up blood. . Your symptoms persist after you have completed your treatment plan MAKE SURE YOU   Understand these instructions.  Will watch your condition.  Will get help right away if you are not doing well or get worse.  Second Diagnosis:   Based on what you have shared with me it looks like you have conjunctivitis.  Conjunctivitis is a common inflammatory or infectious condition of the eye that is often referred to as "pink eye".  In most cases it is contagious (viral or bacterial). However, not all conjunctivitis requires antibiotics (ex. Allergic).  We have made appropriate suggestions for you based upon your presentation.  I have  prescribed Polytrim Ophthalmic drops 1 drop every 4 hours times 5 days  Pink eye can be highly contagious.  It is typically spread through direct contact with secretions, or contaminated objects or surfaces that one may have touched.  Strict handwashing is suggested with soap and water is urged.  If not available, use alcohol based had sanitizer.  Avoid unnecessary touching of the  eye.  If you wear contact lenses, you will need to refrain from wearing them until you see no white discharge from the eye for at least 24 hours after being on medication.  You should see symptom improvement in 1-2 days after starting the medication regimen.  Call us if symptoms are not improved in 1-2 days.  Home Care:  Wash your hands often!  Do not wear your contacts until you complete your treatment plan.  Avoid sharing towels, bed linen, personal items with a person who has pink eye.  See attention for anyone in your home with similar symptoms.  Get Help Right Away If:  Your symptoms do not improve.  You develop blurred or loss of vision.  Your symptoms worsen (increased discharge, pain or redness)   Your e-visit answers were reviewed by a board certified advanced clinical practitioner to complete your personal care plan.  Depending on the condition, your plan could have included both over the counter or prescription medications. If there is a problem please reply  once you have received a response from your provider. Your safety is important to [place].  If you have drug allergies check your prescription carefully.    You can use MyChart to ask questions about today's visit, request a non-urgent call back, or ask for a work or school excuse for 24 hours related to this e-Visit. If it has been greater than 24 hours you will need to follow up with your provider, or enter a new e-Visit to address those concerns. You will get an e-mail in the next two days asking about your experience.  I hope that your e-visit has been valuable and will speed your recovery. Thank you for using e-visits.

## 2016-06-23 ENCOUNTER — Encounter: Payer: Self-pay | Admitting: Internal Medicine

## 2016-06-23 NOTE — Progress Notes (Unsigned)
Results entered and sent to scan  

## 2016-06-25 ENCOUNTER — Encounter: Payer: Self-pay | Admitting: Internal Medicine

## 2016-09-07 ENCOUNTER — Other Ambulatory Visit: Payer: Self-pay | Admitting: Internal Medicine

## 2016-09-08 ENCOUNTER — Other Ambulatory Visit: Payer: Self-pay | Admitting: Internal Medicine

## 2016-09-08 NOTE — Telephone Encounter (Signed)
Okay to refill?  If okay, RX loaded to print

## 2016-09-08 NOTE — Telephone Encounter (Signed)
Pt wanted to leave message with nurse and triage.Marland Kitchen...would like a refill of zaleplon (SONATA) 10 MG capsule    Is trying to get it done as soon as possible because she is leaving the country on Thursday.  Same pharmacy

## 2016-09-09 MED ORDER — ZALEPLON 10 MG PO CAPS: 10.0000 mg | ORAL_CAPSULE | Freq: Every evening | ORAL | 1 refills | Status: DC | PRN

## 2016-09-09 NOTE — Telephone Encounter (Signed)
Rec'd call pt is requesting status on her sonato refill needed rx to be fill ASAP will be leaving going out the country on Thursday. Pls advise if ok to refill Dr. Alain Marion...Kristin Torres

## 2016-09-09 NOTE — Telephone Encounter (Signed)
Ok Pls call in Thx

## 2016-09-09 NOTE — Telephone Encounter (Signed)
Notified pt refill has been called into [facility]...Kristin Torres

## 2016-09-26 ENCOUNTER — Other Ambulatory Visit: Payer: Self-pay | Admitting: Internal Medicine

## 2016-12-27 ENCOUNTER — Other Ambulatory Visit: Payer: Self-pay | Admitting: Internal Medicine

## 2017-01-25 ENCOUNTER — Other Ambulatory Visit (INDEPENDENT_AMBULATORY_CARE_PROVIDER_SITE_OTHER): Payer: Managed Care, Other (non HMO)

## 2017-01-25 ENCOUNTER — Other Ambulatory Visit: Payer: Self-pay

## 2017-01-25 DIAGNOSIS — Z0000 Encounter for general adult medical examination without abnormal findings: Secondary | ICD-10-CM

## 2017-01-25 LAB — BASIC METABOLIC PANEL
BUN: 17 mg/dL (ref 6–23)
CALCIUM: 9.9 mg/dL (ref 8.4–10.5)
CHLORIDE: 100 meq/L (ref 96–112)
CO2: 28 meq/L (ref 19–32)
Creatinine, Ser: 0.84 mg/dL (ref 0.40–1.20)
GFR: 72.51 mL/min (ref >60.00)
Glucose, Bld: 105 mg/dL — ABNORMAL HIGH (ref 70–99)
POTASSIUM: 4.3 meq/L (ref 3.5–5.1)
SODIUM: 137 meq/L (ref 135–145)

## 2017-01-25 LAB — HEPATIC FUNCTION PANEL
ALBUMIN: 4.4 g/dL (ref 3.5–5.2)
ALK PHOS: 78 U/L (ref 39–117)
ALT: 24 U/L (ref 0–35)
AST: 23 U/L (ref 0–37)
Bilirubin, Direct: 0.1 mg/dL (ref 0.0–0.3)
TOTAL PROTEIN: 7.5 g/dL (ref 6.0–8.3)
Total Bilirubin: 0.4 mg/dL (ref 0.2–1.2)

## 2017-01-25 LAB — CBC WITH DIFFERENTIAL/PLATELET
BASOS ABS: 0 10*3/uL (ref 0.0–0.1)
BASOS PCT: 0.9 % (ref 0.0–3.0)
EOS PCT: 3.6 % (ref 0.0–5.0)
Eosinophils Absolute: 0.2 10*3/uL (ref 0.0–0.7)
HEMATOCRIT: 39.5 % (ref 36.0–46.0)
Hemoglobin: 13.5 g/dL (ref 12.0–15.0)
LYMPHS PCT: 46.4 % — AB (ref 12.0–46.0)
Lymphs Abs: 2.6 10*3/uL (ref 0.7–4.0)
MCHC: 34.1 g/dL (ref 30.0–36.0)
MCV: 96.3 fl (ref 78.0–100.0)
MONOS PCT: 6.2 % (ref 3.0–12.0)
Monocytes Absolute: 0.3 10*3/uL (ref 0.1–1.0)
NEUTROS ABS: 2.4 10*3/uL (ref 1.4–7.7)
Neutrophils Relative %: 42.9 % — ABNORMAL LOW (ref 43.0–77.0)
PLATELETS: 251 10*3/uL (ref 150.0–400.0)
RBC: 4.1 Mil/uL (ref 3.87–5.11)
RDW: 12.8 % (ref 11.5–15.5)
WBC: 5.6 10*3/uL (ref 4.0–10.5)

## 2017-01-25 LAB — URINALYSIS, ROUTINE W REFLEX MICROSCOPIC
BILIRUBIN URINE: NEGATIVE
KETONES UR: NEGATIVE
Nitrite: NEGATIVE
PH: 6.5 (ref 5.0–8.0)
RBC / HPF: NONE SEEN (ref 0–?)
Specific Gravity, Urine: 1.01 (ref 1.000–1.030)
TOTAL PROTEIN, URINE-UPE24: NEGATIVE
UROBILINOGEN UA: 0.2 (ref 0.0–1.0)
Urine Glucose: NEGATIVE

## 2017-01-25 LAB — LIPID PANEL
Cholesterol: 214 mg/dL — ABNORMAL HIGH (ref 0–200)
HDL: 76.7 mg/dL (ref >39.00)
LDL Cholesterol: 129 mg/dL — ABNORMAL HIGH (ref 0–99)
NONHDL: 136.92
Total CHOL/HDL Ratio: 3
Triglycerides: 38 mg/dL (ref 0.0–149.0)
VLDL: 7.6 mg/dL (ref 0.0–40.0)

## 2017-01-25 LAB — TSH: TSH: 2.38 u[IU]/mL (ref 0.35–4.50)

## 2017-01-26 ENCOUNTER — Ambulatory Visit (INDEPENDENT_AMBULATORY_CARE_PROVIDER_SITE_OTHER): Payer: Managed Care, Other (non HMO) | Admitting: Internal Medicine

## 2017-01-26 ENCOUNTER — Encounter: Payer: Self-pay | Admitting: Internal Medicine

## 2017-01-26 DIAGNOSIS — R002 Palpitations: Secondary | ICD-10-CM

## 2017-01-26 DIAGNOSIS — N3000 Acute cystitis without hematuria: Secondary | ICD-10-CM

## 2017-01-26 DIAGNOSIS — G4489 Other headache syndrome: Secondary | ICD-10-CM

## 2017-01-26 DIAGNOSIS — Z0000 Encounter for general adult medical examination without abnormal findings: Secondary | ICD-10-CM | POA: Diagnosis not present

## 2017-01-26 DIAGNOSIS — N390 Urinary tract infection, site not specified: Secondary | ICD-10-CM | POA: Insufficient documentation

## 2017-01-26 MED ORDER — CIPROFLOXACIN HCL 250 MG PO TABS: 250.0000 mg | ORAL_TABLET | Freq: Two times a day (BID) | ORAL | 2 refills | Status: DC

## 2017-01-26 MED ORDER — EPINEPHRINE 0.3 MG/0.3ML IJ SOAJ: 0.3000 mg | Freq: Once | INTRAMUSCULAR | 2 refills | Status: DC

## 2017-01-26 MED ORDER — LOSARTAN POTASSIUM-HCTZ 50-12.5 MG PO TABS: 1.0000 | ORAL_TABLET | Freq: Every day | ORAL | 2 refills | Status: DC

## 2017-01-26 MED ORDER — DILTIAZEM HCL ER COATED BEADS 180 MG PO CP24: ORAL_CAPSULE | ORAL | 2 refills | Status: DC

## 2017-01-26 NOTE — Assessment & Plan Note (Signed)
No relapse 

## 2017-01-26 NOTE — Assessment & Plan Note (Signed)
Advil prn 

## 2017-01-26 NOTE — Assessment & Plan Note (Signed)
Cipro prn

## 2017-01-26 NOTE — Progress Notes (Signed)
Subjective:  Patient ID: Kristin Torres, female    DOB: [birth year]  Age: 66 y.o. MRN: [id number]  CC: No chief complaint on file.   HPI Kristin Torres presents for a well exam H/o SVT  Outpatient Medications Prior to Visit  Medication Sig Dispense Refill  . aspirin (ASPIRIN CHILDRENS) 81 MG chewable tablet Chew 1 tablet (81 mg total) by mouth daily. 100 tablet 11  . Biotin (BIOTIN 5000) 5 MG CAPS Take by mouth.    . cholecalciferol (VITAMIN D) 1000 UNITS tablet Take 1,000 Units by mouth daily.    . ciprofloxacin (CIPRO) 250 MG tablet Take 1 tablet (250 mg total) by mouth 2 (two) times daily. 6 tablet 0  . diltiazem (CARDIZEM CD) 180 MG 24 hr capsule TAKE 1 CAPSULE (180 MG TOTAL) BY MOUTH DAILY. 90 capsule 1  . EPINEPHrine (EPIPEN) 0.3 mg/0.3 mL SOAJ injection Inject 0.3 mLs (0.3 mg total) into the muscle once. 2 Device 2  . fexofenadine (ALLEGRA) 180 MG tablet Take 180 mg by mouth daily as needed. allergies     . LORazepam (ATIVAN) 0.5 MG tablet Take 1 tablet (0.5 mg total) by mouth 2 (two) times daily as needed for anxiety. 30 tablet 3  . losartan-hydrochlorothiazide (HYZAAR) 50-12.5 MG tablet TAKE 1 TABLET BY MOUTH DAILY. 90 tablet 0  . trimethoprim-polymyxin b (POLYTRIM) ophthalmic solution Place 1 drop into both eyes every 4 (four) hours. 10 mL 0  . azithromycin (ZITHROMAX) 250 MG tablet Take 2 tabs now then 1 daily times 4 days 6 tablet 0  . benzonatate (TESSALON PERLES) 100 MG capsule Take 1-2 capsules (100-200 mg total) by mouth every 8 (eight) hours as needed for cough. 30 capsule 0  . diphenhydrAMINE (BENADRYL) 25 MG tablet Take 2 tablets (50 mg total) by mouth every 6 (six) hours as needed. Prn reaction x 1-2 days 60 tablet 0  . gabapentin (NEURONTIN) 100 MG capsule Take 1 capsule (100 mg total) by mouth at bedtime. 30 capsule 3  . predniSONE (DELTASONE) 10 MG tablet 40 mg po prn reaction, 20 mg po next day pc 40 tablet 1  . Suvorexant (BELSOMRA) 15 MG TABS Take 15 mg by mouth at  bedtime as needed. (Patient not taking: Reported on 04/07/2016) 30 tablet 2  . zaleplon (SONATA) 10 MG capsule Take 1 capsule (10 mg total) by mouth at bedtime as needed for sleep. 90 capsule 1   Facility-Administered Medications Prior to Visit  Medication Dose Route Frequency Provider Last Rate Last Dose  . 0.9 %  sodium chloride infusion  500 mL Intravenous Continuous Danis, Estill Cotta III, MD        ROS Review of Systems  Constitutional: Negative for activity change, appetite change, chills, fatigue and unexpected weight change.  HENT: Negative for congestion, mouth sores and sinus pressure.   Eyes: Negative for visual disturbance.  Respiratory: Negative for cough and chest tightness.   Gastrointestinal: Negative for abdominal pain and nausea.  Genitourinary: Negative for difficulty urinating, frequency and vaginal pain.  Musculoskeletal: Negative for back pain and gait problem.  Skin: Negative for pallor and rash.  Neurological: Negative for dizziness, tremors, weakness, numbness and headaches.  Psychiatric/Behavioral: Negative for confusion and sleep disturbance.    Objective:  BP 116/78 (BP Location: Right Arm, Patient Position: Sitting, Cuff Size: Normal)   Pulse 67   Temp 98 F (36.7 C)   Ht 5\' 6"  (1.676 m)   Wt 149 lb (67.6 kg)   SpO2 99%   BMI  24.05 kg/m   BP Readings from Last 3 Encounters:  01/26/17 116/78  04/07/16 112/70  12/16/15 108/64    Wt Readings from Last 3 Encounters:  01/26/17 149 lb (67.6 kg)  04/07/16 144 lb (65.3 kg)  12/16/15 150 lb (68 kg)    Physical Exam  Constitutional: She appears well-developed. No distress.  HENT:  Head: Normocephalic.  Right Ear: External ear normal.  Left Ear: External ear normal.  Nose: Nose normal.  Mouth/Throat: Oropharynx is clear and moist.  Eyes: Pupils are equal, round, and reactive to light. Conjunctivae are normal. Right eye exhibits no discharge. Left eye exhibits no discharge.  Neck: Normal range of  motion. Neck supple. No JVD present. No tracheal deviation present. No thyromegaly present.  Cardiovascular: Normal rate, regular rhythm and normal heart sounds.   Pulmonary/Chest: No stridor. No respiratory distress. She has no wheezes.  Abdominal: Soft. Bowel sounds are normal. She exhibits no distension and no mass. There is no tenderness. There is no rebound and no guarding.  Musculoskeletal: She exhibits no edema or tenderness.  Lymphadenopathy:    She has no cervical adenopathy.  Neurological: She displays normal reflexes. No cranial nerve deficit. She exhibits normal muscle tone. Coordination normal.  Skin: No rash noted. No erythema.  Psychiatric: She has a normal mood and affect. Her behavior is normal. Judgment and thought content normal.    Lab Results  Component Value Date   WBC 5.6 01/25/2017   HGB 13.5 01/25/2017   HCT 39.5 01/25/2017   PLT 251.0 01/25/2017   GLUCOSE 105 (H) 01/25/2017   CHOL 214 (H) 01/25/2017   TRIG 38.0 01/25/2017   HDL 76.70 01/25/2017   LDLCALC 129 (H) 01/25/2017   ALT 24 01/25/2017   AST 23 01/25/2017   NA 137 01/25/2017   K 4.3 01/25/2017   CL 100 01/25/2017   CREATININE 0.84 01/25/2017   BUN 17 01/25/2017   CO2 28 01/25/2017   TSH 2.38 01/25/2017   HGBA1C 5.5 09/27/2014    US Abdomen Complete  Result Date: 02/27/2014 CLINICAL DATA:  Left upper quadrant pain EXAM: ULTRASOUND ABDOMEN COMPLETE COMPARISON:  None. FINDINGS: Gallbladder: No gallstones or wall thickening visualized. No sonographic Murphy sign noted. Common bile duct: Diameter: 3.3 mm Liver: Echogenic lesion in the right lobe of liver measures 7 x 7 x 8 mm. IVC: No abnormality visualized. Pancreas: Visualized portion unremarkable. Spleen: Size and appearance within normal limits. Right Kidney: Length: 10.9 cm. Echogenicity within normal limits. No mass or hydronephrosis visualized. Left Kidney: Length: 10.2 cm. Echogenicity within normal limits. No mass or hydronephrosis  visualized. Abdominal aorta: No aneurysm visualized. Other findings: None. IMPRESSION: 1. No acute findings identified. 2. 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 In the absence of known malignancy a followup examination in 6 months with ultrasound is recommended to confirm stability and bus benignity. If there is a history of known malignancy then further assessment with contrast enhanced liver MRI would be recommended. Electronically Signed   By: Kerby Moors M.D.   On: 02/27/2014 09:12    Assessment & Plan:   There are no diagnoses linked to this encounter. I have discontinued Ms. Phung's gabapentin, Suvorexant, predniSONE, diphenhydrAMINE, benzonatate, azithromycin, and zaleplon. I am also having her maintain her fexofenadine, EPINEPHrine, cholecalciferol, aspirin, LORazepam, Biotin, ciprofloxacin, trimethoprim-polymyxin b, diltiazem, and losartan-hydrochlorothiazide. We will continue to administer sodium chloride.  No orders of the defined types were placed in this encounter.    Follow-up:  No Follow-up on file.  Walker Kehr, MD

## 2017-01-26 NOTE — Assessment & Plan Note (Signed)
We discussed age appropriate health related issues, including available/recomended screening tests and vaccinations. We discussed a need for adhering to healthy diet and exercise. Labs were ordered to be later reviewed . All questions were answered.  Flu shot at work

## 2017-04-06 ENCOUNTER — Telehealth: Payer: Self-pay | Admitting: Internal Medicine

## 2017-04-06 NOTE — Telephone Encounter (Signed)
Copied from [place] [id number]. Topic: Referral - Request >> Apr 05, 2017  4:50 PM Ahmed Prima L wrote: Pt is requesting a referral over to Dr Earlean Shawl for GI. Call back is [phone] Dr's number is [phone]. Please call her if this approved

## 2017-04-06 NOTE — Telephone Encounter (Signed)
Pt stated she has a pain under her right rib and you has seen her for it before and could not find anything so she would like referral to someone else to see if they can find out what it is.

## 2017-04-07 NOTE — Telephone Encounter (Signed)
I see a note from 2015 re: LUQ abd pain when we did the [place]. I guess I'll need to see her for this pain first Thx

## 2017-04-08 NOTE — Telephone Encounter (Signed)
Patient has not problem coming in. She just states she is going out of the country on 12[place]22. She could come in tomorrow. Would you be ok with her taking your 915 or 315 tomorrow 12/21?

## 2017-04-08 NOTE — Telephone Encounter (Signed)
Patient has set up an appointment for tomorrow 12/21 @315pm 

## 2017-04-08 NOTE — Telephone Encounter (Signed)
Ok Thx 

## 2017-04-09 ENCOUNTER — Encounter: Payer: Self-pay | Admitting: Internal Medicine

## 2017-04-09 ENCOUNTER — Ambulatory Visit (INDEPENDENT_AMBULATORY_CARE_PROVIDER_SITE_OTHER): Payer: Managed Care, Other (non HMO) | Admitting: Internal Medicine

## 2017-04-09 DIAGNOSIS — R1032 Left lower quadrant pain: Secondary | ICD-10-CM

## 2017-04-09 DIAGNOSIS — R1084 Generalized abdominal pain: Secondary | ICD-10-CM

## 2017-04-09 DIAGNOSIS — R140 Abdominal distension (gaseous): Secondary | ICD-10-CM | POA: Diagnosis not present

## 2017-04-09 NOTE — Assessment & Plan Note (Signed)
LUQ x many years - worse over past 12 mo. Worse w/bending over and at rest. CT abd Labs

## 2017-04-09 NOTE — Progress Notes (Signed)
Subjective:  Patient ID: Kristin Torres, female    DOB: 1951-04-02  Age: 66 y.o. MRN: [id number]  CC: No chief complaint on file.   HPI Kristin Torres presents for a long term pain in the LUQ x many years - worse over past 12 mo. Worse w/bending over and at rest.    Outpatient Medications Prior to Visit  Medication Sig Dispense Refill  . aspirin (ASPIRIN CHILDRENS) 81 MG chewable tablet Chew 1 tablet (81 mg total) by mouth daily. 100 tablet 11  . Biotin (BIOTIN 5000) 5 MG CAPS Take by mouth.    . cholecalciferol (VITAMIN D) 1000 UNITS tablet Take 1,000 Units by mouth daily.    Marland Kitchen diltiazem (CARDIZEM CD) 180 MG 24 hr capsule TAKE 1 CAPSULE (180 MG TOTAL) BY MOUTH DAILY. 90 capsule 2  . fexofenadine (ALLEGRA) 180 MG tablet Take 180 mg by mouth daily as needed. allergies     . LORazepam (ATIVAN) 0.5 MG tablet Take 1 tablet (0.5 mg total) by mouth 2 (two) times daily as needed for anxiety. 30 tablet 3  . losartan-hydrochlorothiazide (HYZAAR) 50-12.5 MG tablet Take 1 tablet by mouth daily. 90 tablet 2  . latanoprost (XALATAN) 0.005 % ophthalmic solution     . ciprofloxacin (CIPRO) 250 MG tablet Take 1 tablet (250 mg total) by mouth 2 (two) times daily. 6 tablet 2   Facility-Administered Medications Prior to Visit  Medication Dose Route Frequency Provider Last Rate Last Dose  . 0.9 %  sodium chloride infusion  500 mL Intravenous Continuous Danis, Estill Cotta III, MD        ROS Review of Systems  Constitutional: Negative for activity change, appetite change, chills, fatigue and unexpected weight change.  HENT: Negative for congestion, mouth sores and sinus pressure.   Eyes: Negative for visual disturbance.  Respiratory: Negative for cough and chest tightness.   Gastrointestinal: Positive for abdominal pain. Negative for abdominal distention, diarrhea and nausea.  Genitourinary: Negative for difficulty urinating, frequency and vaginal pain.  Musculoskeletal: Negative for back pain and gait  problem.  Skin: Negative for pallor and rash.  Neurological: Negative for dizziness, tremors, weakness, numbness and headaches.  Psychiatric/Behavioral: Negative for confusion and sleep disturbance.    Objective:  There were no vitals taken for this visit.  BP Readings from Last 3 Encounters:  01/26/17 116/78  04/07/16 112/70  12/16/15 108/64    Wt Readings from Last 3 Encounters:  01/26/17 149 lb (67.6 kg)  04/07/16 144 lb (65.3 kg)  12/16/15 150 lb (68 kg)    Physical Exam  Constitutional: She appears well-developed. No distress.  HENT:  Head: Normocephalic.  Right Ear: External ear normal.  Left Ear: External ear normal.  Nose: Nose normal.  Mouth/Throat: Oropharynx is clear and moist.  Eyes: Conjunctivae are normal. Pupils are equal, round, and reactive to light. Right eye exhibits no discharge. Left eye exhibits no discharge.  Neck: Normal range of motion. Neck supple. No JVD present. No tracheal deviation present. No thyromegaly present.  Cardiovascular: Normal rate, regular rhythm and normal heart sounds.  Pulmonary/Chest: No stridor. No respiratory distress. She has no wheezes.  Abdominal: Soft. Bowel sounds are normal. She exhibits no distension and no mass. There is no tenderness. There is no rebound and no guarding.  Musculoskeletal: She exhibits no edema or tenderness.  Lymphadenopathy:    She has no cervical adenopathy.  Neurological: She displays normal reflexes. No cranial nerve deficit. She exhibits normal muscle tone. Coordination normal.  Skin: No rash  noted. No erythema.  Psychiatric: She has a normal mood and affect. Her behavior is normal. Judgment and thought content normal.    Lab Results  Component Value Date   WBC 5.6 01/25/2017   HGB 13.5 01/25/2017   HCT 39.5 01/25/2017   PLT 251.0 01/25/2017   GLUCOSE 105 (H) 01/25/2017   CHOL 214 (H) 01/25/2017   TRIG 38.0 01/25/2017   HDL 76.70 01/25/2017   LDLCALC 129 (H) 01/25/2017   ALT 24  01/25/2017   AST 23 01/25/2017   NA 137 01/25/2017   K 4.3 01/25/2017   CL 100 01/25/2017   CREATININE 0.84 01/25/2017   BUN 17 01/25/2017   CO2 28 01/25/2017   TSH 2.38 01/25/2017   HGBA1C 5.5 09/27/2014    US Abdomen Complete  Result Date: 02/27/2014 CLINICAL DATA:  Left upper quadrant pain EXAM: ULTRASOUND ABDOMEN COMPLETE COMPARISON:  None. FINDINGS: Gallbladder: No gallstones or wall thickening visualized. No sonographic Murphy sign noted. Common bile duct: Diameter: 3.3 mm Liver: Echogenic lesion in the right lobe of liver measures 7 x 7 x 8 mm. IVC: No abnormality visualized. Pancreas: Visualized portion unremarkable. Spleen: Size and appearance within normal limits. Right Kidney: Length: 10.9 cm. Echogenicity within normal limits. No mass or hydronephrosis visualized. Left Kidney: Length: 10.2 cm. Echogenicity within normal limits. No mass or hydronephrosis visualized. Abdominal aorta: No aneurysm visualized. Other findings: None. IMPRESSION: 1. No acute findings identified. 2. 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 In the absence of known malignancy a followup examination in 6 months with ultrasound is recommended to confirm stability and bus benignity. If there is a history of known malignancy then further assessment with contrast enhanced liver MRI would be recommended. Electronically Signed   By: Kerby Moors M.D.   On: 02/27/2014 09:12    Assessment & Plan:   There are no diagnoses linked to this encounter. I have discontinued Shelby Cataldi's ciprofloxacin. I am also having her maintain her fexofenadine, cholecalciferol, aspirin, LORazepam, Biotin, diltiazem, losartan-hydrochlorothiazide, and latanoprost. We will continue to administer sodium chloride.  No orders of the defined types were placed in this encounter.    Follow-up: No Follow-up on file.  Walker Kehr, MD

## 2017-04-19 ENCOUNTER — Encounter: Payer: Managed Care, Other (non HMO) | Admitting: Internal Medicine

## 2017-04-21 ENCOUNTER — Telehealth: Payer: Self-pay | Admitting: Internal Medicine

## 2017-04-21 MED ORDER — PROMETHAZINE-CODEINE 6.25-10 MG/5ML PO SYRP: 5.0000 mL | ORAL_SOLUTION | ORAL | 0 refills | Status: DC | PRN

## 2017-04-21 NOTE — Telephone Encounter (Signed)
Additional CRM: Pt called back and wanted to see if the doctor or his nurse can give her a call back regarding the cough she is having.

## 2017-04-21 NOTE — Telephone Encounter (Signed)
Copied from [place] [id number]. Topic: Quick Communication - See Telephone Encounter >> Apr 21, 2017 11:05 AM Aurelio Brash B wrote: CRM for notification. See Telephone encounter for:  Pt has a dry cough and is asking if Dr can prescribe a medication  [id number] [address], [place] 04/21/17.

## 2017-04-21 NOTE — Telephone Encounter (Signed)
OK: Prom-cod syr was emailed OV if worse Thx

## 2017-04-22 NOTE — Telephone Encounter (Signed)
Notified pt MD sent rx to [facility] 

## 2017-04-29 ENCOUNTER — Telehealth: Payer: Self-pay | Admitting: Internal Medicine

## 2017-04-29 NOTE — Telephone Encounter (Signed)
Copied from [place] [phone]. Topic: Referral - Status >> Apr 29, 2017  2:46 PM Robina Ade, Helene Kelp D wrote: Reason for CRM: Patient called and would like to know the results of her CT. Please call patient back, thanks.

## 2017-04-30 ENCOUNTER — Encounter: Payer: Self-pay | Admitting: Internal Medicine

## 2017-04-30 NOTE — Telephone Encounter (Signed)
See pt e-mail

## 2017-05-08 ENCOUNTER — Encounter: Payer: Self-pay | Admitting: Internal Medicine

## 2017-05-12 ENCOUNTER — Other Ambulatory Visit: Payer: Self-pay | Admitting: Internal Medicine

## 2017-05-12 MED ORDER — PANCRELIPASE (LIP-PROT-AMYL) 36000-114000 UNITS PO CPEP: 36000.0000 [IU] | ORAL_CAPSULE | Freq: Three times a day (TID) | ORAL | 3 refills | Status: DC

## 2017-05-18 ENCOUNTER — Telehealth: Payer: Self-pay | Admitting: Internal Medicine

## 2017-05-18 NOTE — Telephone Encounter (Signed)
Copied from [place]. Topic: Inquiry >> May 18, 2017  2:28 PM Malena Catholic I, NT wrote: Reason for CRM: pt call state the Doctor Plotnikov  suggest a pain Med for left side pain and she would like  Doctor Plotnikov to call it to her Pharmacy,thanks

## 2017-05-18 NOTE — Telephone Encounter (Signed)
Copied from [place]. Topic: Inquiry >> May 18, 2017  2:28 PM Malena Catholic I, NT wrote: Reason for CRM: pt call state the Doctor Plotnikov  suggest a pain Med for left side pain and she would like  Doctor Plotnikov to call it to her Pharmacy,thanks

## 2017-05-19 ENCOUNTER — Other Ambulatory Visit: Payer: Self-pay | Admitting: Internal Medicine

## 2017-05-19 MED ORDER — HYOSCYAMINE SULFATE 0.125 MG PO TABS: 0.1250 mg | ORAL_TABLET | Freq: Four times a day (QID) | ORAL | 3 refills | Status: DC | PRN

## 2017-05-19 NOTE — Telephone Encounter (Signed)
Called pt no answer LMOM w/MD response. MD sent rx's to [facility].Marland KitchenJohny Chess

## 2017-05-19 NOTE — Telephone Encounter (Signed)
Creon prescription was emailed earlier. She can also try Levsin as directed as needed, prescription emailed. Use a stool softener Thank you

## 2017-06-04 ENCOUNTER — Other Ambulatory Visit: Payer: Self-pay | Admitting: Internal Medicine

## 2017-06-20 ENCOUNTER — Other Ambulatory Visit: Payer: Self-pay | Admitting: Internal Medicine

## 2017-06-22 NOTE — Telephone Encounter (Signed)
Pt said she is currently taking this medicine. She said she never stopped. Please advise.

## 2017-06-22 NOTE — Telephone Encounter (Signed)
LOV: 04/09/17  Dr. Alain Marion    [facility] [place]

## 2017-09-02 ENCOUNTER — Other Ambulatory Visit: Payer: Self-pay | Admitting: Internal Medicine

## 2017-10-06 ENCOUNTER — Telehealth: Payer: Self-pay | Admitting: Internal Medicine

## 2017-10-06 MED ORDER — LOSARTAN POTASSIUM-HCTZ 50-12.5 MG PO TABS: 1.0000 | ORAL_TABLET | Freq: Every day | ORAL | 11 refills | Status: DC

## 2017-10-06 NOTE — Addendum Note (Signed)
Addended by: Cassandria Anger on: 10/06/2017 11:01 PM   Modules accepted: Orders

## 2017-10-06 NOTE — Telephone Encounter (Signed)
Copied from [place] [phone]. Topic: Quick Communication - See Telephone Encounter >> Oct 06, 2017 10:44 AM Nils Flack wrote: CRM for notification. See Telephone encounter for: 10/06/17. Pt is asking to be switched back to losartan. Pt likes it better, and doesn't;t like to switch around  [facility]rian Rein is [facility] [address]  Cb is [phone], pt is asking ofr call when it is sent in

## 2017-10-06 NOTE — Telephone Encounter (Signed)
Okay.  Done.  Thanks 

## 2017-10-07 NOTE — Telephone Encounter (Signed)
Tried calling pt to inform her MD sent Losartan to [facility]. could not leave msg due to vm being full. Sent [facility] CRM just in case pt return call.Marland KitchenJohny Chess

## 2017-10-11 ENCOUNTER — Other Ambulatory Visit: Payer: Self-pay | Admitting: *Deleted

## 2017-10-11 NOTE — Telephone Encounter (Signed)
Pt had to switch her Lorazepam 0.5 mg  rx to [facility] because [facility] was out [facility]. Her rx at [facility] has no refills left.   She is requesting a [facility] now. She is going out of town on Friday, 10/15/17.   Please advise.

## 2017-10-13 MED ORDER — LORAZEPAM 0.5 MG PO TABS: 0.5000 mg | ORAL_TABLET | Freq: Two times a day (BID) | ORAL | 3 refills | Status: DC | PRN

## 2017-11-05 ENCOUNTER — Telehealth: Payer: Self-pay | Admitting: Internal Medicine

## 2017-11-05 NOTE — Telephone Encounter (Signed)
Copied from [place] [phone]. Topic: Quick Communication - See Telephone Encounter >> Nov 05, 2017  4:40 PM Percell Belt A wrote: CRM for notification. See Telephone encounter for: 11/05/17. Pt called in and would like these 2 meds transferred to her mail order pharmacy [facility] [phone]  losartan-hydrochlorothiazide [facility]) 50-12.5 MG tablet [[id number]] diltiazem (CARDIZEM CD) 180 MG 24 hr capsule [[id number]]

## 2017-11-08 MED ORDER — DILTIAZEM HCL ER COATED BEADS 180 MG PO CP24: ORAL_CAPSULE | ORAL | 2 refills | Status: DC

## 2017-11-08 MED ORDER — LOSARTAN POTASSIUM-HCTZ 50-12.5 MG PO TABS: 1.0000 | ORAL_TABLET | Freq: Every day | ORAL | 3 refills | Status: DC

## 2017-11-08 NOTE — Telephone Encounter (Signed)
RXs sent.

## 2018-01-27 DIAGNOSIS — H401221 Low-tension glaucoma, left eye, mild stage: Secondary | ICD-10-CM | POA: Insufficient documentation

## 2018-03-23 ENCOUNTER — Ambulatory Visit (INDEPENDENT_AMBULATORY_CARE_PROVIDER_SITE_OTHER): Payer: Managed Care, Other (non HMO) | Admitting: Internal Medicine

## 2018-03-23 ENCOUNTER — Encounter: Payer: Self-pay | Admitting: Internal Medicine

## 2018-03-23 ENCOUNTER — Encounter

## 2018-03-23 VITALS — BP 116/70 | HR 81 | Temp 97.8°F | Ht 66.0 in | Wt 151.0 lb

## 2018-03-23 DIAGNOSIS — R1012 Left upper quadrant pain: Secondary | ICD-10-CM | POA: Diagnosis not present

## 2018-03-23 DIAGNOSIS — Z0000 Encounter for general adult medical examination without abnormal findings: Secondary | ICD-10-CM | POA: Diagnosis not present

## 2018-03-23 DIAGNOSIS — I10 Essential (primary) hypertension: Secondary | ICD-10-CM | POA: Diagnosis not present

## 2018-03-23 MED ORDER — PANTOPRAZOLE SODIUM 40 MG PO TBEC: 40.0000 mg | DELAYED_RELEASE_TABLET | Freq: Every day | ORAL | 3 refills | Status: DC

## 2018-03-23 NOTE — Progress Notes (Signed)
Subjective:  Patient ID: Kristin Torres, female    DOB: Sep 12, 1950  Age: 67 y.o. MRN: [id number]  CC: No chief complaint on file.   HPI Kailly Richoux presents for a well exam  Outpatient Medications Prior to Visit  Medication Sig Dispense Refill  . aspirin (ASPIRIN CHILDRENS) 81 MG chewable tablet Chew 1 tablet (81 mg total) by mouth daily. 100 tablet 11  . Biotin (BIOTIN 5000) 5 MG CAPS Take by mouth.    . cholecalciferol (VITAMIN D) 1000 UNITS tablet Take 1,000 Units by mouth daily.    Marland Kitchen diltiazem (CARDIZEM CD) 180 MG 24 hr capsule TAKE 1 CAPSULE (180 MG TOTAL) BY MOUTH DAILY. 90 capsule 2  . fexofenadine (ALLEGRA) 180 MG tablet Take 180 mg by mouth daily as needed. allergies     . latanoprost (XALATAN) 0.005 % ophthalmic solution     . LORazepam (ATIVAN) 0.5 MG tablet Take 1 tablet (0.5 mg total) by mouth 2 (two) times daily as needed for anxiety. 30 tablet 3  . losartan-hydrochlorothiazide (HYZAAR) 50-12.5 MG tablet Take 1 tablet by mouth daily. 90 tablet 3  . promethazine-codeine (PHENERGAN WITH CODEINE) 6.25-10 MG/5ML syrup Take 5 mLs by mouth every 4 (four) hours as needed. 300 mL 0  . zaleplon (SONATA) 10 MG capsule take 1 capsule by mouth at bedtime if needed 90 capsule 1  . hyoscyamine (LEVSIN, ANASPAZ) 0.125 MG tablet Take 1-2 tablets (0.125-0.25 mg total) by mouth every 6 (six) hours as needed for cramping. 100 tablet 3  . lipase/protease/amylase (CREON) 36000 UNITS CPEP capsule Take 1 capsule (36,000 Units total) by mouth 3 (three) times daily before meals. Take with the first bite of food 180 capsule 3   Facility-Administered Medications Prior to Visit  Medication Dose Route Frequency Provider Last Rate Last Dose  . 0.9 %  sodium chloride infusion  500 mL Intravenous Continuous Danis, Estill Cotta III, MD        ROS: Review of Systems  Constitutional: Negative for activity change, appetite change, chills, diaphoresis, fatigue, fever and unexpected weight change.  HENT:  Negative for congestion, dental problem, ear pain, hearing loss, mouth sores, postnasal drip, sinus pressure, sneezing, sore throat and voice change.   Eyes: Negative for pain and visual disturbance.  Respiratory: Negative for cough, chest tightness, wheezing and stridor.   Cardiovascular: Negative for chest pain, palpitations and leg swelling.  Gastrointestinal: Negative for abdominal distention, abdominal pain, blood in stool, nausea, rectal pain and vomiting.  Genitourinary: Negative for decreased urine volume, difficulty urinating, dysuria, frequency, hematuria, menstrual problem, vaginal bleeding, vaginal discharge and vaginal pain.  Musculoskeletal: Positive for arthralgias. Negative for back pain, gait problem, joint swelling and neck pain.  Skin: Negative for color change, pallor, rash and wound.  Neurological: Negative for dizziness, tremors, syncope, speech difficulty, weakness, light-headedness, numbness and headaches.  Hematological: Negative for adenopathy.  Psychiatric/Behavioral: Negative for behavioral problems, confusion, decreased concentration, dysphoric mood, hallucinations, sleep disturbance and suicidal ideas. The patient is not nervous/anxious and is not hyperactive.     Objective:  BP 116/70 (BP Location: Left Arm, Patient Position: Sitting, Cuff Size: Normal)   Pulse 81   Temp 97.8 F (36.6 C) (Oral)   Ht 5\' 6"  (1.676 m)   Wt 151 lb (68.5 kg)   SpO2 99%   BMI 24.37 kg/m   BP Readings from Last 3 Encounters:  03/23/18 116/70  01/26/17 116/78  04/07/16 112/70    Wt Readings from Last 3 Encounters:  03/23/18 151 lb (68.5  kg)  01/26/17 149 lb (67.6 kg)  04/07/16 144 lb (65.3 kg)    Physical Exam  Constitutional: She appears well-developed. No distress.  HENT:  Head: Normocephalic.  Right Ear: External ear normal.  Left Ear: External ear normal.  Nose: Nose normal.  Mouth/Throat: Oropharynx is clear and moist.  Eyes: Pupils are equal, round, and  reactive to light. Conjunctivae are normal. Right eye exhibits no discharge. Left eye exhibits no discharge.  Neck: Normal range of motion. Neck supple. No JVD present. No tracheal deviation present. No thyromegaly present.  Cardiovascular: Normal rate, regular rhythm and normal heart sounds.  Pulmonary/Chest: No stridor. No respiratory distress. She has no wheezes.  Abdominal: Soft. Bowel sounds are normal. She exhibits no distension and no mass. There is no tenderness. There is no rebound and no guarding.  Musculoskeletal: She exhibits no edema or tenderness.  Lymphadenopathy:    She has no cervical adenopathy.  Neurological: She displays normal reflexes. No cranial nerve deficit. She exhibits normal muscle tone. Coordination normal.  Skin: No rash noted. No erythema.  Psychiatric: She has a normal mood and affect. Her behavior is normal. Judgment and thought content normal.    Lab Results  Component Value Date   WBC 5.6 01/25/2017   HGB 13.5 01/25/2017   HCT 39.5 01/25/2017   PLT 251.0 01/25/2017   GLUCOSE 105 (H) 01/25/2017   CHOL 214 (H) 01/25/2017   TRIG 38.0 01/25/2017   HDL 76.70 01/25/2017   LDLCALC 129 (H) 01/25/2017   ALT 24 01/25/2017   AST 23 01/25/2017   NA 137 01/25/2017   K 4.3 01/25/2017   CL 100 01/25/2017   CREATININE 0.84 01/25/2017   BUN 17 01/25/2017   CO2 28 01/25/2017   TSH 2.38 01/25/2017   HGBA1C 5.5 09/27/2014    US Abdomen Complete  Result Date: 02/27/2014 CLINICAL DATA:  Left upper quadrant pain EXAM: ULTRASOUND ABDOMEN COMPLETE COMPARISON:  None. FINDINGS: Gallbladder: No gallstones or wall thickening visualized. No sonographic Murphy sign noted. Common bile duct: Diameter: 3.3 mm Liver: Echogenic lesion in the right lobe of liver measures 7 x 7 x 8 mm. IVC: No abnormality visualized. Pancreas: Visualized portion unremarkable. Spleen: Size and appearance within normal limits. Right Kidney: Length: 10.9 cm. Echogenicity within normal limits. No mass  or hydronephrosis visualized. Left Kidney: Length: 10.2 cm. Echogenicity within normal limits. No mass or hydronephrosis visualized. Abdominal aorta: No aneurysm visualized. Other findings: None. IMPRESSION: 1. No acute findings identified. 2. 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 In the absence of known malignancy a followup examination in 6 months with ultrasound is recommended to confirm stability and bus benignity. If there is a history of known malignancy then further assessment with contrast enhanced liver MRI would be recommended. Electronically Signed   By: Kerby Moors M.D.   On: 02/27/2014 09:12    Assessment & Plan:   There are no diagnoses linked to this encounter.   No orders of the defined types were placed in this encounter.    Follow-up: No follow-ups on file.  Walker Kehr, MD

## 2018-03-23 NOTE — Patient Instructions (Addendum)
Try Creon with meals       Gluten free trial for 4-6 weeks. OK to use gluten-free bread and gluten-free pasta.    Gluten-Free Diet for Celiac Disease, Adult The gluten-free diet includes all foods that do not contain gluten. Gluten is a protein that is found in wheat, rye, barley, and some other grains. Following the gluten-free diet is the only treatment for people with celiac disease. It helps to prevent damage to the intestines and improves or eliminates the symptoms of celiac disease. Following the gluten-free diet requires some planning. It can be challenging at first, but it gets easier with time and practice. There are more gluten-free options available today than ever before. If you need help finding gluten-free foods or if you have questions, talk with your diet and nutrition specialist (registered dietitian) or your health care provider. What do I need to know about a gluten-free diet?  All fruits, vegetables, and meats are safe to eat and do not contain gluten.  When grocery shopping, start by shopping in the produce, meat, and dairy sections. These sections are more likely to contain gluten-free foods. Then move to the aisles that contain packaged foods if you need to.  Read all food labels. Gluten is often added to foods. Always check the ingredient list and look for warnings, such as "may contain gluten."  Talk with your dietitian or health care provider before taking a gluten-free multivitamin or mineral supplement.  Be aware of gluten-free foods having contact with foods that contain gluten (cross-contamination). This can happen at home and with any processed foods. ? Talk with your health care provider or dietitian about how to reduce the risk of cross-contamination in your home. ? If you have questions about how a food is processed, ask the manufacturer. What key words help to identify gluten? Foods that list any of these key words on the label usually contain  gluten:  Wheat, flour, enriched flour, bromated flour, white flour, durum flour, graham flour, phosphated flour, self-rising flour, semolina, farina, barley (malt), rye, and oats.  Starch, dextrin, modified food starch, or cereal.  Thickening, fillers, or emulsifiers.  Malt flavoring, malt extract, or malt syrup.  Hydrolyzed vegetable protein.  In the [place] packaged foods that are gluten-free are required to be labeled "GF." These foods should be easy to identify and are safe to eat. In the [place] food companies are also required to list common food allergens, including wheat, on their labels. Recommended foods Grains  Amaranth, bean flours, 100% buckwheat flour, corn, millet, nut flours or nut meals, GF oats, quinoa, rice, sorghum, teff, rice wafers, pure cornmeal tortillas, popcorn, and hot cereals made from cornmeal. Hominy, rice, wild rice. Some Asian rice noodles or bean noodles. Arrowroot starch, corn bran, corn flour, corn germ, cornmeal, corn starch, potato flour, potato starch flour, and rice bran. Plain, brown, and sweet rice flours. Rice polish, soy flour, and tapioca starch. Vegetables  All plain fresh, frozen, and canned vegetables. Fruits  All plain fresh, frozen, canned, and dried fruits, and 100% fruit juices. Meats and other protein foods  All fresh beef, pork, poultry, fish, seafood, and eggs. Fish canned in water, oil, brine, or vegetable broth. Plain nuts and seeds, peanut butter. Some lunch meat and some frankfurters. Dried beans, dried peas, and lentils. Dairy  Fresh plain, dry, evaporated, or condensed milk. Cream, butter, sour cream, whipping cream, and most yogurts. Unprocessed cheese, most processed cheeses, some cottage cheese, some cream cheeses. Beverages  Coffee, tea, most  herbal teas. Carbonated beverages and some root beers. Wine, sake, and distilled spirits, such as gin, vodka, and whiskey. Most hard ciders. Fats and oils  Butter, margarine, vegetable  oil, hydrogenated butter, olive oil, shortening, lard, cream, and some mayonnaise. Some commercial salad dressings. Olives. Sweets and desserts  Sugar, honey, some syrups, molasses, jelly, and jam. Plain hard candy, marshmallows, and gumdrops. Pure cocoa powder. Plain chocolate. Custard and some pudding mixes. Gelatin desserts, sorbets, frozen ice pops, and sherbet. Cake, cookies, and other desserts prepared with allowed flours. Some commercial ice creams. Cornstarch, tapioca, and rice puddings. Seasoning and other foods  Some canned or frozen soups. Monosodium glutamate (MSG). Cider, rice, and wine vinegar. Baking soda and baking powder. Cream of tartar. Baking and nutritional yeast. Certain soy sauces made without wheat (ask your dietitian about specific brands that are allowed). Nuts, coconut, and chocolate. Salt, pepper, herbs, spices, flavoring extracts, imitation or artificial flavorings, natural flavorings, and food colorings. Some medicines and supplements. Some lip glosses and other cosmetics. Rice syrups. The items listed may not be a complete list. Talk with your dietitian about what dietary choices are best for you. Foods to avoid Grains  Barley, bran, bulgur, couscous, cracked wheat, [place], farro, graham, malt, matzo, semolina, wheat germ, and all wheat and rye cereals including spelt and kamut. Cereals containing malt as a flavoring, such as rice cereal. Noodles, spaghetti, macaroni, most packaged rice mixes, and all mixes containing wheat, rye, barley, or triticale. Vegetables  Most creamed vegetables and most vegetables canned in sauces. Some commercially prepared vegetables and salads. Fruits  Thickened or prepared fruits and some pie fillings. Some fruit snacks and fruit roll-ups. Meats and other protein foods  Any meat or meat alternative containing wheat, rye, barley, or gluten stabilizers. These are often marinated or packaged meats and lunch meats. Bread-containing  products, such as Swiss steak, croquettes, meatballs, and meatloaf. Most tuna canned in vegetable broth and [place] with hydrolyzed vegetable protein (HVP) injected as part of the basting. Seitan. Imitation fish. Eggs in sauces made from ingredients to avoid. Dairy  Commercial chocolate milk drinks and malted milk. Some non-dairy creamers. Any cheese product containing ingredients to avoid. Beverages  Certain cereal beverages. Beer, ale, malted milk, and some root beers. Some hard ciders. Some instant flavored coffees. Some herbal teas made with barley or with barley malt added. Fats and oils  Some commercial salad dressings. Sour cream containing modified food starch. Sweets and desserts  Some toffees. Chocolate-coated nuts (may be rolled in wheat flour) and some commercial candies and candy bars. Most cakes, cookies, donuts, pastries, and other baked goods. Some commercial ice cream. Ice cream cones. Commercially prepared mixes for cakes, cookies, and other desserts. Bread pudding and other puddings thickened with flour. Products containing brown rice syrup made with barley malt enzyme. Desserts and sweets made with malt flavoring. Seasoning and other foods  Some curry powders, some dry seasoning mixes, some gravy extracts, some meat sauces, some ketchups, some prepared mustards, and horseradish. Certain soy sauces. Malt vinegar. Bouillon and bouillon cubes that contain HVP. Some chip dips, and some chewing gum. Yeast extract. Brewer's yeast. Caramel color. Some medicines and supplements. Some lip glosses and other cosmetics. The items listed may not be a complete list. Talk with your dietitian about what dietary choices are best for you. Summary  Gluten is a protein that is found in wheat, rye, barley, and some other grains. The gluten-free diet includes all foods that do not contain gluten.  If  you need help finding gluten-free foods or if you have questions, talk with your diet and nutrition  specialist (registered dietitian) or your health care provider.  Read all food labels. Gluten is often added to foods. Always check the ingredient list and look for warnings, such as "may contain gluten." This information is not intended to replace advice given to you by your health care provider. Make sure you discuss any questions you have with your health care provider. Document Released: 04/06/2005 Document Revised: 01/20/2016 Document Reviewed: 01/20/2016 Elsevier Interactive Patient Education  2018 [employer].

## 2018-03-23 NOTE — Assessment & Plan Note (Signed)
BP Readings from Last 3 Encounters:  03/23/18 116/70  01/26/17 116/78  04/07/16 112/70   Hyzaar Diltiazem

## 2018-03-23 NOTE — Assessment & Plan Note (Addendum)
Chronic - worse w/bending 12/19 ?etiology Abd [place], abd CTresults reviewed [facility] ref dr Earlean Shawl Empiric Protonix, Creon Labs Gluten free diet trial

## 2018-03-24 ENCOUNTER — Other Ambulatory Visit (INDEPENDENT_AMBULATORY_CARE_PROVIDER_SITE_OTHER): Payer: Managed Care, Other (non HMO)

## 2018-03-24 DIAGNOSIS — Z0000 Encounter for general adult medical examination without abnormal findings: Secondary | ICD-10-CM

## 2018-03-24 DIAGNOSIS — R1012 Left upper quadrant pain: Secondary | ICD-10-CM | POA: Diagnosis not present

## 2018-03-24 LAB — HEPATIC FUNCTION PANEL
ALT: 33 U/L (ref 0–35)
AST: 30 U/L (ref 0–37)
Albumin: 4.2 g/dL (ref 3.5–5.2)
Alkaline Phosphatase: 78 U/L (ref 39–117)
Bilirubin, Direct: 0 mg/dL (ref 0.0–0.3)
TOTAL PROTEIN: 7.2 g/dL (ref 6.0–8.3)
Total Bilirubin: 0.3 mg/dL (ref 0.2–1.2)

## 2018-03-24 LAB — URINALYSIS, ROUTINE W REFLEX MICROSCOPIC
Bilirubin Urine: NEGATIVE
Hgb urine dipstick: NEGATIVE
Ketones, ur: NEGATIVE
Nitrite: NEGATIVE
RBC / HPF: NONE SEEN (ref 0–?)
SPECIFIC GRAVITY, URINE: 1.01 (ref 1.000–1.030)
Total Protein, Urine: NEGATIVE
UROBILINOGEN UA: 0.2 (ref 0.0–1.0)
Urine Glucose: NEGATIVE
pH: 6 (ref 5.0–8.0)

## 2018-03-24 LAB — CBC WITH DIFFERENTIAL/PLATELET
Basophils Absolute: 0 10*3/uL (ref 0.0–0.1)
Basophils Relative: 0.9 % (ref 0.0–3.0)
Eosinophils Absolute: 0.2 10*3/uL (ref 0.0–0.7)
Eosinophils Relative: 3.7 % (ref 0.0–5.0)
HEMATOCRIT: 39.8 % (ref 36.0–46.0)
Hemoglobin: 13.4 g/dL (ref 12.0–15.0)
Lymphocytes Relative: 47.6 % — ABNORMAL HIGH (ref 12.0–46.0)
Lymphs Abs: 2.7 10*3/uL (ref 0.7–4.0)
MCHC: 33.6 g/dL (ref 30.0–36.0)
MCV: 96.5 fl (ref 78.0–100.0)
Monocytes Absolute: 0.4 10*3/uL (ref 0.1–1.0)
Monocytes Relative: 7.3 % (ref 3.0–12.0)
NEUTROS PCT: 40.5 % — AB (ref 43.0–77.0)
Neutro Abs: 2.3 10*3/uL (ref 1.4–7.7)
PLATELETS: 226 10*3/uL (ref 150.0–400.0)
RBC: 4.12 Mil/uL (ref 3.87–5.11)
RDW: 12.7 % (ref 11.5–15.5)
WBC: 5.8 10*3/uL (ref 4.0–10.5)

## 2018-03-24 LAB — BASIC METABOLIC PANEL
BUN: 26 mg/dL — AB (ref 6–23)
CO2: 29 mEq/L (ref 19–32)
CREATININE: 0.78 mg/dL (ref 0.40–1.20)
Calcium: 9.7 mg/dL (ref 8.4–10.5)
Chloride: 103 mEq/L (ref 96–112)
GFR: 78.7 mL/min (ref >60.00)
Glucose, Bld: 94 mg/dL (ref 70–99)
Potassium: 4.4 mEq/L (ref 3.5–5.1)
Sodium: 139 mEq/L (ref 135–145)

## 2018-03-24 LAB — SEDIMENTATION RATE: Sed Rate: 13 mm/hr (ref 0–30)

## 2018-03-24 LAB — H. PYLORI ANTIBODY, IGG: H Pylori IgG: NEGATIVE

## 2018-03-24 LAB — TSH: TSH: 2.62 u[IU]/mL (ref 0.35–4.50)

## 2018-06-11 ENCOUNTER — Other Ambulatory Visit: Payer: Self-pay | Admitting: Internal Medicine

## 2018-06-21 ENCOUNTER — Ambulatory Visit: Payer: Self-pay | Admitting: *Deleted

## 2018-06-21 ENCOUNTER — Telehealth: Payer: Self-pay

## 2018-06-21 ENCOUNTER — Encounter: Payer: Self-pay | Admitting: Internal Medicine

## 2018-06-21 ENCOUNTER — Ambulatory Visit (INDEPENDENT_AMBULATORY_CARE_PROVIDER_SITE_OTHER): Payer: Managed Care, Other (non HMO) | Admitting: Internal Medicine

## 2018-06-21 DIAGNOSIS — J101 Influenza due to other identified influenza virus with other respiratory manifestations: Secondary | ICD-10-CM | POA: Insufficient documentation

## 2018-06-21 LAB — POC INFLUENZA A&B (BINAX/QUICKVUE)
INFLUENZA B, POC: NEGATIVE
Influenza A, POC: POSITIVE — AB

## 2018-06-21 MED ORDER — PROMETHAZINE-CODEINE 6.25-10 MG/5ML PO SOLN: 5.0000 mL | Freq: Two times a day (BID) | ORAL | 0 refills | Status: DC | PRN

## 2018-06-21 MED ORDER — FLUTICASONE PROPIONATE 50 MCG/ACT NA SUSP: 2.0000 | Freq: Every day | NASAL | 1 refills | Status: DC

## 2018-06-21 NOTE — Progress Notes (Signed)
   Subjective:   Patient ID: Kristin Torres, female    DOB: 1950-10-06, 68 y.o.   MRN: [id number]  HPI The patient is a 68 y.o. female coming in for cold symptoms. Concerning travel for coronavirus as recent trip to [place]. Fevers started last Saturday but has been feeling mediocre since 6 days ago. Main symptoms are: fever, sore throat, coughing, mild SOB. Denies body aches. Overall it is stable, cough mildly worse. Has tried cough medicine leftover. Still taking tylenol and last dose less than 4 hours ago. She was at a conference 1200 people and she is not sure about who all was there. Some international but she thinks none from [place] or [place].   Review of Systems  Constitutional: Positive for activity change, appetite change, chills and fever. Negative for fatigue and unexpected weight change.  HENT: Positive for congestion, postnasal drip, rhinorrhea and sinus pressure. Negative for ear discharge, ear pain, sinus pain, sneezing, sore throat, tinnitus, trouble swallowing and voice change.   Eyes: Negative.   Respiratory: Positive for cough. Negative for chest tightness, shortness of breath and wheezing.   Cardiovascular: Negative.   Gastrointestinal: Negative.   Musculoskeletal: Positive for myalgias.  Neurological: Negative.     Objective:  Physical Exam Constitutional:      Appearance: She is well-developed.  HENT:     Head: Normocephalic and atraumatic.     Comments: Oropharynx with redness and clear drainage, nose with swollen turbinates, TMs normal bilaterally.  Neck:     Musculoskeletal: Normal range of motion.     Thyroid: No thyromegaly.  Cardiovascular:     Rate and Rhythm: Normal rate and regular rhythm.  Pulmonary:     Effort: Pulmonary effort is normal. No respiratory distress.     Breath sounds: Normal breath sounds. No wheezing or rales.  Abdominal:     Palpations: Abdomen is soft.  Musculoskeletal:        General: Tenderness present.  Lymphadenopathy:   Cervical: No cervical adenopathy.  Skin:    General: Skin is warm and dry.  Neurological:     Mental Status: She is alert and oriented to person, place, and time.     Vitals:   06/21/18 1049  BP: 120/80  Pulse: 88  Temp: 98.4 F (36.9 C)  TempSrc: Oral  SpO2: 95%  Height: 5\' 6"  (1.676 m)    Assessment & Plan:  Visit time 25 minutes: greater than 50% of that time was spent in face to face counseling and coordination of care with the patient: counseled about travel history and risk for coronavirus given symptoms and timeline with contacts at conference

## 2018-06-21 NOTE — Assessment & Plan Note (Signed)
Flu testing done and positive for flu A. Not given tamiflu as she is outside the window. We discussed how she could have been exposed to coronavirus while in [place] although the risk is low. It is not likely that she has coronavirus and flu at the same time so flu testing positive lowers risk that current symptoms are from coronavirus. Advised to stay home while sick. Rx for flonase and promethazine/codeine cough syrup which she has in the past and tolerated well with good relief of symptoms.

## 2018-06-21 NOTE — Telephone Encounter (Signed)
Copied from [place] [id number]. Topic: General - Other >> Jun 21, 2018  9:27 AM [place], Kristin Torres wrote: Left message for patient to contact the office on the backline number [phone]) when she gets here so that we can meet her out in the main lobby and bring her in the back way to be put in an exam room.  Talked with patient to make sure she got the message we left earlier---she will call when she gets to sliding doors and I will bring mask out to her, she will be taken to room B9---dr crawford will see patient in that room

## 2018-06-21 NOTE — Telephone Encounter (Signed)
Pt reports non-productive cough, mild sore throat, LGT 99.5-100.4, onset Saturday. Pt had been attending conference in [place] from Tuesday through Friday, returned Friday. States Wednesday while in [place] "I lost my voice but that went away and I felt fine until Saturday."  Denies any other symptoms. States has taken Ibuprofen, and "Cough syrup with codeine from an old prescription." States she does not think any attendees at conference were international, has not traveled internationally herself. [place] called practice and spoke to [place]. Appt made with Dr. Sharlet Salina for today at 1100. Pt informed of precautions that will be taken at the [facility].  Pt verbalizes understanding.  Reason for Disposition . [1] Continuous (nonstop) coughing interferes with work or school AND [2] no improvement using cough treatment per protocol  Answer Assessment - Initial Assessment Questions 1. ONSET: "When did the cough begin?"     Saturday 2. SEVERITY: "How bad is the cough today?"      MOderate 3. RESPIRATORY DISTRESS: "Describe your breathing."      WNL 4. FEVER: "Do you have a fever?" If so, ask: "What is your temperature, how was it measured, and when did it start?"     Yes 99.5-100.4 5. HEMOPTYSIS: "Are you coughing up any blood?" If so ask: "How much?" (flecks, streaks, tablespoons, etc.)     no 6. TREATMENT: "What have you done so far to treat the cough?" (e.g., meds, fluids, humidifier)     "Cough syrup with codeine from old prescription" " Ibuprofen  7. CARDIAC HISTORY: "Do you have any history of heart disease?" (e.g., heart attack, congestive heart failure)      HTN, SVT 8. LUNG HISTORY: "Do you have any history of lung disease?"  (e.g., pulmonary embolus, asthma, emphysema)    no 9. PE RISK FACTORS: "Do you have a history of blood clots?" (or: recent major surgery, recent prolonged travel, bedridden)    no 10. OTHER SYMPTOMS: "Do you have any other symptoms? (e.g., runny nose, wheezing, chest  pain)       none  12. TRAVEL: "Have you traveled out of the country in the last month?" (e.g., travel history, exposures)      NO. Did return from conference in [place] Friday  Protocols used: [place]

## 2018-06-21 NOTE — Patient Instructions (Signed)
You do have the flu type A which makes it very unlikely that this is the coronavirus.   We have sent in the flonase to use 2 sprays in each nostril once daily for the next 1-2 weeks.   We have also sent in the cough medicine to use as needed.

## 2018-07-06 NOTE — Telephone Encounter (Signed)
Plot pt

## 2018-08-07 ENCOUNTER — Other Ambulatory Visit: Payer: Self-pay | Admitting: Internal Medicine

## 2018-09-15 LAB — HM MAMMOGRAPHY

## 2018-09-19 ENCOUNTER — Encounter: Payer: Self-pay | Admitting: Internal Medicine

## 2018-11-08 ENCOUNTER — Other Ambulatory Visit: Payer: Self-pay | Admitting: Internal Medicine

## 2019-03-19 ENCOUNTER — Other Ambulatory Visit: Payer: Self-pay | Admitting: Internal Medicine

## 2019-05-25 MED ORDER — EPINEPHRINE 0.3 MG/0.3ML IJ SOAJ: 0.3000 mg | Freq: Once | INTRAMUSCULAR | 2 refills | Status: AC

## 2019-06-14 ENCOUNTER — Other Ambulatory Visit: Payer: Self-pay | Admitting: Internal Medicine

## 2019-07-20 ENCOUNTER — Other Ambulatory Visit: Payer: Self-pay | Admitting: Internal Medicine

## 2019-09-12 ENCOUNTER — Other Ambulatory Visit: Payer: Self-pay | Admitting: Internal Medicine

## 2019-09-14 ENCOUNTER — Other Ambulatory Visit: Payer: Self-pay | Admitting: Internal Medicine

## 2019-09-14 NOTE — Telephone Encounter (Signed)
Pt has'nt seen MD since 2019. Will need ov for renewals.Marland KitchenJohny Chess

## 2019-09-14 NOTE — Telephone Encounter (Signed)
New message:    1.Medication Requested: zaleplon (SONATA) 10 MG capsule 2. Pharmacy (Name, [place], [place]): [id number] - [place], [place] [address] AT [address]. On Med List: yes  4. Last Visit with PCP: 06/21/18  5. Next visit date with PCP: No   Agent: Please be advised that RX refills may take up to 3 business days. We ask that you follow-up with your pharmacy.

## 2019-09-15 ENCOUNTER — Other Ambulatory Visit: Payer: Self-pay | Admitting: Internal Medicine

## 2019-09-15 DIAGNOSIS — I471 Supraventricular tachycardia: Secondary | ICD-10-CM

## 2019-09-15 DIAGNOSIS — Z0000 Encounter for general adult medical examination without abnormal findings: Secondary | ICD-10-CM

## 2019-09-19 ENCOUNTER — Encounter: Payer: Self-pay | Admitting: Internal Medicine

## 2019-09-21 ENCOUNTER — Other Ambulatory Visit (INDEPENDENT_AMBULATORY_CARE_PROVIDER_SITE_OTHER): Payer: Managed Care, Other (non HMO)

## 2019-09-21 DIAGNOSIS — Z0000 Encounter for general adult medical examination without abnormal findings: Secondary | ICD-10-CM | POA: Diagnosis not present

## 2019-09-21 DIAGNOSIS — I471 Supraventricular tachycardia: Secondary | ICD-10-CM | POA: Diagnosis not present

## 2019-09-21 LAB — BASIC METABOLIC PANEL
BUN: 20 mg/dL (ref 6–23)
CO2: 29 mEq/L (ref 19–32)
Calcium: 9.7 mg/dL (ref 8.4–10.5)
Chloride: 100 mEq/L (ref 96–112)
Creatinine, Ser: 0.83 mg/dL (ref 0.40–1.20)
GFR: 68.61 mL/min (ref >60.00)
Glucose, Bld: 93 mg/dL (ref 70–99)
Potassium: 4.3 mEq/L (ref 3.5–5.1)
Sodium: 136 mEq/L (ref 135–145)

## 2019-09-21 LAB — CBC WITH DIFFERENTIAL/PLATELET
Basophils Absolute: 0 10*3/uL (ref 0.0–0.1)
Basophils Relative: 0.8 % (ref 0.0–3.0)
Eosinophils Absolute: 0.2 10*3/uL (ref 0.0–0.7)
Eosinophils Relative: 3 % (ref 0.0–5.0)
HCT: 39.1 % (ref 36.0–46.0)
Hemoglobin: 13.3 g/dL (ref 12.0–15.0)
Lymphocytes Relative: 48.4 % — ABNORMAL HIGH (ref 12.0–46.0)
Lymphs Abs: 2.5 10*3/uL (ref 0.7–4.0)
MCHC: 34 g/dL (ref 30.0–36.0)
MCV: 95.4 fl (ref 78.0–100.0)
Monocytes Absolute: 0.4 10*3/uL (ref 0.1–1.0)
Monocytes Relative: 6.9 % (ref 3.0–12.0)
Neutro Abs: 2.1 10*3/uL (ref 1.4–7.7)
Neutrophils Relative %: 40.9 % — ABNORMAL LOW (ref 43.0–77.0)
Platelets: 220 10*3/uL (ref 150.0–400.0)
RBC: 4.1 Mil/uL (ref 3.87–5.11)
RDW: 12.6 % (ref 11.5–15.5)
WBC: 5.2 10*3/uL (ref 4.0–10.5)

## 2019-09-21 LAB — T4, FREE: Free T4: 0.87 ng/dL (ref 0.60–1.60)

## 2019-09-21 LAB — URINALYSIS, ROUTINE W REFLEX MICROSCOPIC
Bilirubin Urine: NEGATIVE
Ketones, ur: NEGATIVE
Nitrite: NEGATIVE
Specific Gravity, Urine: 1.02 (ref 1.000–1.030)
Total Protein, Urine: NEGATIVE
Urine Glucose: NEGATIVE
Urobilinogen, UA: 0.2 (ref 0.0–1.0)
pH: 7 (ref 5.0–8.0)

## 2019-09-21 LAB — HEPATIC FUNCTION PANEL
ALT: 21 U/L (ref 0–35)
AST: 23 U/L (ref 0–37)
Albumin: 4.3 g/dL (ref 3.5–5.2)
Alkaline Phosphatase: 75 U/L (ref 39–117)
Bilirubin, Direct: 0 mg/dL (ref 0.0–0.3)
Total Bilirubin: 0.4 mg/dL (ref 0.2–1.2)
Total Protein: 7.3 g/dL (ref 6.0–8.3)

## 2019-09-21 LAB — LIPID PANEL
Cholesterol: 218 mg/dL — ABNORMAL HIGH (ref 0–200)
HDL: 70.2 mg/dL (ref >39.00)
LDL Cholesterol: 139 mg/dL — ABNORMAL HIGH (ref 0–99)
NonHDL: 147.3
Total CHOL/HDL Ratio: 3
Triglycerides: 41 mg/dL (ref 0.0–149.0)
VLDL: 8.2 mg/dL (ref 0.0–40.0)

## 2019-09-21 LAB — TSH: TSH: 1.82 u[IU]/mL (ref 0.35–4.50)

## 2019-09-25 ENCOUNTER — Encounter: Payer: Self-pay | Admitting: Internal Medicine

## 2019-09-25 ENCOUNTER — Other Ambulatory Visit: Payer: Self-pay

## 2019-09-25 ENCOUNTER — Ambulatory Visit (INDEPENDENT_AMBULATORY_CARE_PROVIDER_SITE_OTHER): Payer: Managed Care, Other (non HMO) | Admitting: Internal Medicine

## 2019-09-25 DIAGNOSIS — Z0000 Encounter for general adult medical examination without abnormal findings: Secondary | ICD-10-CM

## 2019-09-25 MED ORDER — ZALEPLON 10 MG PO CAPS: ORAL_CAPSULE | ORAL | 2 refills | Status: DC

## 2019-09-25 MED ORDER — VITAMIN D3 50 MCG (2000 UT) PO CAPS: 2000.0000 [IU] | ORAL_CAPSULE | Freq: Every day | ORAL | 3 refills | Status: AC

## 2019-09-25 NOTE — Assessment & Plan Note (Signed)
  We discussed age appropriate health related issues, including available/recomended screening tests and vaccinations. Labs were ordered to be later reviewed . All questions were answered. We discussed one or more of the following - seat belt use, use of sunscreen/sun exposure exercise, safe sex, fall risk reduction, second hand smoke exposure, firearm use and storage, seat belt use, a need for adhering to healthy diet and exercise. Labs were discussed. All questions were answered. Colonoscopy - 2017

## 2019-09-25 NOTE — Patient Instructions (Addendum)
  Try GLYTONE exfoliating body lotion by Desmond Dike (free acid value 17.5)  Shibumi shade

## 2019-09-25 NOTE — Progress Notes (Signed)
Subjective:  Patient ID: Kristin Torres, female    DOB: 09/17/1950  Age: 69 y.o. MRN: [id number]  CC: No chief complaint on file.   HPI Kristin Torres presents for well exam C/o leg swelling  Outpatient Medications Prior to Visit  Medication Sig Dispense Refill   aspirin (ASPIRIN CHILDRENS) 81 MG chewable tablet Chew 1 tablet (81 mg total) by mouth daily. 100 tablet 11   Biotin (BIOTIN 5000) 5 MG CAPS Take by mouth.     cholecalciferol (VITAMIN D) 1000 UNITS tablet Take 1,000 Units by mouth daily.     diltiazem (CARDIZEM CD) 180 MG 24 hr capsule TAKE 1 CAPSULE DAILY 90 capsule 1   fexofenadine (ALLEGRA) 180 MG tablet Take 180 mg by mouth daily as needed. allergies      fluticasone (FLONASE) 50 MCG/ACT nasal spray Place 2 sprays into both nostrils daily. 16 g 1   latanoprost (XALATAN) 0.005 % ophthalmic solution      LORazepam (ATIVAN) 0.5 MG tablet Take 1 tablet (0.5 mg total) by mouth 2 (two) times daily as needed for anxiety. 30 tablet 3   losartan-hydrochlorothiazide (HYZAAR) 50-12.5 MG tablet TAKE 1 TABLET DAILY 90 tablet 0   pantoprazole (PROTONIX) 40 MG tablet Take 1 tablet (40 mg total) by mouth daily. 30 tablet 3   Promethazine-Codeine 6.25-10 MG/5ML SOLN Take 5-10 mLs by mouth 2 (two) times daily as needed (cough). 100 mL 0   zaleplon (SONATA) 10 MG capsule TAKE 1 CAPSULE BY MOUTH EVERY DAY AT BEDTIME AS NEEDED 30 capsule 5   No facility-administered medications prior to visit.    ROS: Review of Systems  Constitutional: Negative for activity change, appetite change, chills, fatigue and unexpected weight change.  HENT: Negative for congestion, mouth sores and sinus pressure.   Eyes: Negative for visual disturbance.  Respiratory: Negative for cough and chest tightness.   Cardiovascular: Positive for leg swelling.  Gastrointestinal: Negative for abdominal pain and nausea.  Genitourinary: Negative for difficulty urinating, frequency and vaginal pain.    Musculoskeletal: Negative for back pain and gait problem.  Skin: Negative for pallor and rash.  Neurological: Negative for dizziness, tremors, weakness, numbness and headaches.  Psychiatric/Behavioral: Positive for sleep disturbance. Negative for confusion and suicidal ideas.    Objective:  BP 118/80 (BP Location: Left Arm, Patient Position: Sitting, Cuff Size: Normal)    Pulse 79    Temp 98.1 F (36.7 C) (Oral)    Ht 5\' 6"  (1.676 m)    Wt 150 lb (68 kg)    SpO2 98%    BMI 24.21 kg/m   BP Readings from Last 3 Encounters:  09/25/19 118/80  06/21/18 120/80  03/23/18 116/70    Wt Readings from Last 3 Encounters:  09/25/19 150 lb (68 kg)  03/23/18 151 lb (68.5 kg)  01/26/17 149 lb (67.6 kg)    Physical Exam Constitutional:      General: She is not in acute distress.    Appearance: She is well-developed.  HENT:     Head: Normocephalic.     Right Ear: External ear normal.     Left Ear: External ear normal.     Nose: Nose normal.  Eyes:     General:        Right eye: No discharge.        Left eye: No discharge.     Conjunctiva/sclera: Conjunctivae normal.     Pupils: Pupils are equal, round, and reactive to light.  Neck:     Thyroid:  No thyromegaly.     Vascular: No JVD.     Trachea: No tracheal deviation.  Cardiovascular:     Rate and Rhythm: Normal rate and regular rhythm.     Heart sounds: Normal heart sounds.  Pulmonary:     Effort: No respiratory distress.     Breath sounds: No stridor. No wheezing.  Abdominal:     General: Bowel sounds are normal. There is no distension.     Palpations: Abdomen is soft. There is no mass.     Tenderness: There is no abdominal tenderness. There is no guarding or rebound.  Musculoskeletal:        General: No tenderness.     Cervical back: Normal range of motion and neck supple.  Lymphadenopathy:     Cervical: No cervical adenopathy.  Skin:    Findings: No erythema or rash.  Neurological:     Mental Status: She is oriented to  person, place, and time.     Cranial Nerves: No cranial nerve deficit.     Motor: No abnormal muscle tone.     Coordination: Coordination normal.     Deep Tendon Reflexes: Reflexes normal.  Psychiatric:        Behavior: Behavior normal.        Thought Content: Thought content normal.        Judgment: Judgment normal.   no edema   Lab Results  Component Value Date   WBC 5.2 09/21/2019   HGB 13.3 09/21/2019   HCT 39.1 09/21/2019   PLT 220.0 09/21/2019   GLUCOSE 93 09/21/2019   CHOL 218 (H) 09/21/2019   TRIG 41.0 09/21/2019   HDL 70.20 09/21/2019   LDLCALC 139 (H) 09/21/2019   ALT 21 09/21/2019   AST 23 09/21/2019   NA 136 09/21/2019   K 4.3 09/21/2019   CL 100 09/21/2019   CREATININE 0.83 09/21/2019   BUN 20 09/21/2019   CO2 29 09/21/2019   TSH 1.82 09/21/2019   HGBA1C 5.5 09/27/2014    US Abdomen Complete  Result Date: 02/27/2014 CLINICAL DATA:  Left upper quadrant pain EXAM: ULTRASOUND ABDOMEN COMPLETE COMPARISON:  None. FINDINGS: Gallbladder: No gallstones or wall thickening visualized. No sonographic Murphy sign noted. Common bile duct: Diameter: 3.3 mm Liver: Echogenic lesion in the right lobe of liver measures 7 x 7 x 8 mm. IVC: No abnormality visualized. Pancreas: Visualized portion unremarkable. Spleen: Size and appearance within normal limits. Right Kidney: Length: 10.9 cm. Echogenicity within normal limits. No mass or hydronephrosis visualized. Left Kidney: Length: 10.2 cm. Echogenicity within normal limits. No mass or hydronephrosis visualized. Abdominal aorta: No aneurysm visualized. Other findings: None. IMPRESSION: 1. No acute findings identified. 2. 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 In the absence of known malignancy a followup examination in 6 months with ultrasound is recommended to confirm stability and bus benignity. If there is a history of known malignancy then further assessment with contrast enhanced  liver MRI would be recommended. Electronically Signed   By: Kerby Moors M.D.   On: 02/27/2014 09:12    Assessment & Plan:     Follow-up: No follow-ups on file.  Walker Kehr, MD

## 2019-09-27 LAB — HM MAMMOGRAPHY

## 2019-09-29 ENCOUNTER — Encounter: Payer: Self-pay | Admitting: Internal Medicine

## 2019-10-22 ENCOUNTER — Other Ambulatory Visit: Payer: Self-pay | Admitting: Internal Medicine

## 2019-12-12 ENCOUNTER — Ambulatory Visit (INDEPENDENT_AMBULATORY_CARE_PROVIDER_SITE_OTHER): Payer: Managed Care, Other (non HMO) | Admitting: Family Medicine

## 2019-12-12 ENCOUNTER — Ambulatory Visit: Payer: Self-pay

## 2019-12-12 ENCOUNTER — Other Ambulatory Visit: Payer: Self-pay

## 2019-12-12 ENCOUNTER — Encounter: Payer: Self-pay | Admitting: Family Medicine

## 2019-12-12 VITALS — BP 90/58 | HR 71 | Ht 66.0 in | Wt 150.4 lb

## 2019-12-12 DIAGNOSIS — M25562 Pain in left knee: Secondary | ICD-10-CM | POA: Diagnosis not present

## 2019-12-12 NOTE — Patient Instructions (Signed)
Thank you for coming in today. Plan for physical therapy.  Use voltaren gel  If not improved let me know.  Next step is xray and likely injection trial.  I recommend compression sleeve during exercises.   I recommend you obtained a compression sleeve to help with your joint problems. There are many options on the market however I recommend obtaining a Full KNee Body Helix compression sleeve.  You can find information (including how to appropriate measure yourself for sizing) can be found at www.Body http://www.lambert.com/.  Many of these products are health savings account (HSA) eligible.   You can use the compression sleeve at any time throughout the day but is most important to use while being active as well as for 2 hours post-activity.   It is appropriate to ice following activity with the compression sleeve in place.  Recheck in 4-6 weeks as needed.

## 2019-12-12 NOTE — Progress Notes (Signed)
fo        I, Wendy Poet, LAT, ATC, am serving as scribe for Dr. Lynne Leader.  Kristin Torres is a 69 y.o. female who presents to [place] at [facility] today for L knee pain.  She was last seen by Dr. Tamala Julian on 08/29/15 for B knee pain.  Since then, pt reports L knee pain x one month.  She locates her pain to her L lateral knee.  She is also reporting burning pain running down her L anterior lower leg.  She notes her symptoms are pretty mild and she is here mostly because her husband suggest that she go to the doctor to address this problem.  L knee swelling: No L knee mechanical symptoms: No Low back pain: No Aggravating factors: Sitting w/ her knees bent; extending her L knee after sitting for an extended period of time Treatments tried: Tylenol;    Pertinent review of systems: No fevers or chills  Relevant historical information: History of SVT. 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   MSK: Left knee normal-appearing Mildly tender palpation anterior lateral knee and tibia. Mildly tender palpation fibular head. Normal knee motion. Stable ligamentous exam. Intact strength.  Unable to reproduce distal paresthesias by pressure overlying common fibular nerve at the fibular head  L-spine nontender normal motion negative slump test. Motion and strength reflexes and sensation are intact distally.    Lab and Radiology Results  Diagnostic Limited MSK Ultrasound of: Left knee Quad tendon intact. Slight hyperechoic change at distal tendon insertion on the patella. Trace joint effusion superior patellar space Patellar tendon normal-appearing Moderate joint line minimally narrowed with slight  degenerative appearing lateral meniscus. Medial joint line mildly narrowed minimally degenerative. Medial meniscus. Posterior knee no Baker's cyst. Normal appearance of the proximal fibular head and common fibular nerve Impression: Mild degenerative changes     Assessment and Plan: 69 y.o. female with left knee pain likely minimal degenerative changes. History of patellofemoral pain syndrome or IT band syndrome. This is somewhat similar to that. Plan for trial of physical therapy along with Voltaren gel. If not improving return to clinic for repeat evaluation. May benefit from injection and x-ray at that time. Patient is happy with this plan.   PDMP not reviewed this encounter. Orders Placed This Encounter  Procedures  . [place] LIMITED JOINT SPACE STRUCTURES LOW LEFT(NO LINKED CHARGES)    Order Specific Question:   Reason for Exam (SYMPTOM  OR DIAGNOSIS REQUIRED)    Answer:   L knee pain    Order Specific Question:   Preferred imaging location?    Answer:   [place]  . Ambulatory referral to Physical Therapy    Referral Priority:   Routine    Referral Type:   Physical Medicine    Referral Reason:   Specialty Services Required    Requested Specialty:   Physical Therapy   No orders of the defined types were placed in this encounter.    Discussed warning signs or symptoms. Please see discharge instructions. Patient expresses understanding.   The above documentation has been reviewed and is accurate and complete Lynne Leader, M.D.

## 2020-01-02 ENCOUNTER — Other Ambulatory Visit: Payer: Self-pay | Admitting: Internal Medicine

## 2020-01-06 ENCOUNTER — Other Ambulatory Visit: Payer: Self-pay | Admitting: Internal Medicine

## 2020-03-23 ENCOUNTER — Other Ambulatory Visit: Payer: Self-pay | Admitting: Internal Medicine

## 2020-04-02 ENCOUNTER — Telehealth: Payer: Self-pay | Admitting: Internal Medicine

## 2020-04-02 NOTE — Telephone Encounter (Signed)
Noted.. pharmacy updated.Marland KitchenJohny Torres

## 2020-04-02 NOTE — Telephone Encounter (Signed)
Patient called and said as of 1.1.2022 all her medications will need to go to [id number] - [place], [place] - [address].

## 2020-05-02 ENCOUNTER — Other Ambulatory Visit: Payer: Self-pay | Admitting: Internal Medicine

## 2020-05-02 ENCOUNTER — Encounter: Payer: Self-pay | Admitting: Gastroenterology

## 2020-05-02 DIAGNOSIS — R194 Change in bowel habit: Secondary | ICD-10-CM

## 2020-05-02 DIAGNOSIS — K219 Gastro-esophageal reflux disease without esophagitis: Secondary | ICD-10-CM

## 2020-05-02 MED ORDER — ZALEPLON 10 MG PO CAPS: ORAL_CAPSULE | ORAL | 3 refills | Status: DC

## 2020-05-23 DIAGNOSIS — Z83511 Family history of glaucoma: Secondary | ICD-10-CM | POA: Diagnosis not present

## 2020-05-23 DIAGNOSIS — H401222 Low-tension glaucoma, left eye, moderate stage: Secondary | ICD-10-CM | POA: Diagnosis not present

## 2020-05-23 DIAGNOSIS — H401211 Low-tension glaucoma, right eye, mild stage: Secondary | ICD-10-CM | POA: Diagnosis not present

## 2020-06-11 NOTE — Progress Notes (Signed)
   I, Wendy Poet, LAT, ATC, am serving as scribe for Dr. Lynne Leader.  Kristin Torres is a 70 y.o. female who presents to [place] at [facility] today for rib pain due to a fall on her L side while on vacation.  She was last seen by Dr. Georgina Snell on 12/12/19 for L knee pain and was referred to PT and advised to use a knee compression sleeve.  Since then, pt reports injuring her L mid anterior ribs when she fell after tripping while getting up in the middle of the night when she got up to go to the bathroom and fell on her L side.  She locates her pain to her L mid-anterior ribcage just inferior to her breast.  Radiating pain: No Aggravating factors: trunk flexion and extension; pressure to the area  Treatments tried: Nothing   Pertinent review of systems: No fevers or chills  Relevant historical information: Superior ventricular tachycardia   Exam:  BP 104/78 (BP Location: Right Arm, Patient Position: Sitting, Cuff Size: Normal)   Pulse 66   Ht 5\' 6"  (1.676 m)   Wt 151 lb 3.2 oz (68.6 kg)   SpO2 98%   BMI 24.40 kg/m  General: Well Developed, well nourished, and in no acute distress.   Chest: Heart regular rate and rhythm no murmurs rubs or gallops. Lungs clear to auscultation bilaterally. Lungs symmetrical expansion bilaterally. Chest wall mildly tender palpation anterior inferior left chest wall.      Assessment and Plan: 70 y.o. female with rib contusion. Doubtful for fracture. Conservative management with rib binder and splinting as needed. Deep inspiration during the day. Over-the-counter medicines as needed as well. Recommend resuming normal activities including exercise. If not improving would recommend proceeding with an x-ray. Both the patient and myself agree that x-rays right now can be avoided.   Recheck as needed.    Discussed warning signs or symptoms. Please see discharge instructions. Patient expresses understanding.   The above  documentation has been reviewed and is accurate and complete Lynne Leader, M.D.

## 2020-06-12 ENCOUNTER — Other Ambulatory Visit: Payer: Self-pay

## 2020-06-12 ENCOUNTER — Encounter: Payer: Self-pay | Admitting: Family Medicine

## 2020-06-12 ENCOUNTER — Ambulatory Visit: Payer: PPO | Admitting: Family Medicine

## 2020-06-12 VITALS — BP 104/78 | HR 66 | Ht 66.0 in | Wt 151.2 lb

## 2020-06-12 DIAGNOSIS — S20212A Contusion of left front wall of thorax, initial encounter: Secondary | ICD-10-CM | POA: Diagnosis not present

## 2020-06-12 NOTE — Patient Instructions (Signed)
Thank you for coming in today.  A rib binder may binder help.   Take deep breaths while awake.   Ok to resume exercise.   Listen to your body If you hurt a lot back off on the activity.   If not getting better let me know and I can order an xray.

## 2020-06-13 ENCOUNTER — Ambulatory Visit: Payer: Managed Care, Other (non HMO) | Admitting: Gastroenterology

## 2020-06-18 ENCOUNTER — Other Ambulatory Visit (INDEPENDENT_AMBULATORY_CARE_PROVIDER_SITE_OTHER): Payer: PPO

## 2020-06-18 ENCOUNTER — Other Ambulatory Visit: Payer: Self-pay

## 2020-06-18 ENCOUNTER — Encounter: Payer: Self-pay | Admitting: Gastroenterology

## 2020-06-18 ENCOUNTER — Ambulatory Visit: Payer: PPO | Admitting: Gastroenterology

## 2020-06-18 VITALS — BP 104/60 | HR 76 | Ht 64.25 in | Wt 150.5 lb

## 2020-06-18 DIAGNOSIS — R1012 Left upper quadrant pain: Secondary | ICD-10-CM

## 2020-06-18 DIAGNOSIS — R194 Change in bowel habit: Secondary | ICD-10-CM

## 2020-06-18 LAB — CBC WITH DIFFERENTIAL/PLATELET
Basophils Absolute: 0 10*3/uL (ref 0.0–0.1)
Basophils Relative: 0.8 % (ref 0.0–3.0)
Eosinophils Absolute: 0.2 10*3/uL (ref 0.0–0.7)
Eosinophils Relative: 2.6 % (ref 0.0–5.0)
HCT: 38 % (ref 36.0–46.0)
Hemoglobin: 13.1 g/dL (ref 12.0–15.0)
Lymphocytes Relative: 31.9 % (ref 12.0–46.0)
Lymphs Abs: 2.1 10*3/uL (ref 0.7–4.0)
MCHC: 34.4 g/dL (ref 30.0–36.0)
MCV: 96 fl (ref 78.0–100.0)
Monocytes Absolute: 0.4 10*3/uL (ref 0.1–1.0)
Monocytes Relative: 6.3 % (ref 3.0–12.0)
Neutro Abs: 3.9 10*3/uL (ref 1.4–7.7)
Neutrophils Relative %: 58.4 % (ref 43.0–77.0)
Platelets: 234 10*3/uL (ref 150.0–400.0)
RBC: 3.96 Mil/uL (ref 3.87–5.11)
RDW: 13.1 % (ref 11.5–15.5)
WBC: 6.6 10*3/uL (ref 4.0–10.5)

## 2020-06-18 LAB — CALCIUM: Calcium: 9.6 mg/dL (ref 8.4–10.5)

## 2020-06-18 LAB — T4, FREE: Free T4: 1.42 ng/dL (ref 0.60–1.60)

## 2020-06-18 LAB — TSH: TSH: 1.4 u[IU]/mL (ref 0.35–4.50)

## 2020-06-18 NOTE — Progress Notes (Signed)
[name]:  History: Kristin Torres 06/18/2020  Referring provider: Cassandria Anger, MD  Reason for consult/chief complaint: Gastroesophageal Reflux (Intermittent sore throat), change in bowel habits (Constipation, really hard small stools x 6-7 months, having a BM 1-2 times a day but hard), and Abdominal Pain (Intermittent LUQ abdominal pain especially when bending/)   Subjective  HPI:  This is a very pleasant 70 year old woman referred by primary care for change in bowel habits.  She has a chronic left anterior lower chest wall/left upper quadrant pain for many years, bothers her more bending over and has been previously evaluated and thought to be musculoskeletal.  Recent sports medicine visit for recent fall and left rib contusion.  She has an intermittent sore throat that might last about an hour and she wonders if it may be reflux related.  She does not get regurgitation or heartburn and has not felt need to take any medicines for it.  She denies dysphagia, odynophagia nausea vomiting change in appetite or weight loss.  Kristin Torres is mainly bothered by about 6 months of a change in bowel habits where the stool does like small balls and pellets on average twice a day.  She has not seen any bleeding and is not taking any particular therapies.  No new medicines or dose changes around the time of this change in bowel habits, and she reports being on diltiazem for at least 10 years. She last saw me for colonoscopy in August 2017, which was a normal study with no polyps found.  No polyps in 2007 and one subcentimeter tubular adenoma in 2004.  ROS:  Review of Systems  Constitutional: Negative for appetite change and unexpected weight change.  HENT: Negative for mouth sores and voice change.   Eyes: Negative for pain and redness.  Respiratory: Negative for cough and shortness of breath.   Cardiovascular: Negative for chest pain and palpitations.   Genitourinary: Negative for dysuria and hematuria.  Musculoskeletal: Negative for arthralgias and myalgias.  Skin: Negative for pallor and rash.  Allergic/Immunologic: Positive for environmental allergies.  Neurological: Negative for weakness and headaches.  Hematological: Negative for adenopathy.     Past Medical History: Past Medical History:  Diagnosis Date  . Cervicalgia   . Chronic back pain   . Dermatophytosis of nail   . GERD (gastroesophageal reflux disease)   . History of pneumonia   . Hypertension   . Leg cramps   . MI (myocardial infarction) ([place])   . Nocturia   . Personal history of other diseases of circulatory system   . PSVT (paroxysmal supraventricular tachycardia) ([place])   . Sinusitis   . SVT (supraventricular tachycardia) ([place])   . Tubal pregnancy   . Tubular adenoma of colon      Past Surgical History: Past Surgical History:  Procedure Laterality Date  . ABLATION OF DYSRHYTHMIC FOCUS  04/25/2014   svt  . APPENDECTOMY    . BREAST EXCISIONAL BIOPSY Right 2011   benign  right breast  . OOPHORECTOMY    . POLYPECTOMY    . SUPRAVENTRICULAR TACHYCARDIA ABLATION N/A 04/25/2014   Procedure: SUPRAVENTRICULAR TACHYCARDIA ABLATION;  Surgeon: Evans Lance, MD;  Location: [facility] CATH LAB;  Service: Cardiovascular;  Laterality: N/A;  . TUBAL LIGATION       Family History: Family History  Problem Relation Age of Onset  . Stomach cancer Paternal Grandmother   . Breast cancer Maternal Grandmother   . Diabetes Maternal Grandmother   . Hypertension Maternal  Grandmother   . Diabetes Mother   . Hypertension Mother   . Glaucoma Mother   . Heart attack Father 62  . Glaucoma Brother   . Hypertension Maternal Grandfather   . Colon cancer Neg Hx     Social History: Social History   Socioeconomic History  . Marital status: Married    Spouse name: Not on file  . Number of children: 2  . Years of education: Not on file  . Highest education level: Not on file   Occupational History  . Occupation: microbiologist/retired    Employer: [employer]  Tobacco Use  . Smoking status: Never Smoker  . Smokeless tobacco: Never Used  Vaping Use  . Vaping Use: Never used  Substance and Sexual Activity  . Alcohol use: Yes    Alcohol/week: 3.0 standard drinks    Types: 3 Glasses of wine per week    Comment: 1 per day  . Drug use: No  . Sexual activity: Not on file  Other Topics Concern  . 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 Bee Venom Anaphylaxis  . Streptomycin Rash  . Mango Flavor Swelling  . Caffeine Palpitations  . Penicillins Rash    Outpatient Meds: Current Outpatient Medications  Medication Sig Dispense Refill  . aspirin (ASPIRIN CHILDRENS) 81 MG chewable tablet Chew 1 tablet (81 mg total) by mouth daily. 100 tablet 11  . Biotin 5 MG CAPS Take by mouth.    Marland Kitchen BLACK COHOSH PO Take 500 mg by mouth daily.    Marland Kitchen CALCIUM-MAGNESIUM-ZINC PO Take 1 tablet by mouth daily.    . Cholecalciferol (VITAMIN D3) 50 MCG (2000 UT) capsule Take 1 capsule (2,000 Units total) by mouth daily. 100 capsule 3  . diltiazem (CARDIZEM CD) 180 MG 24 hr capsule TAKE 1 CAPSULE DAILY 90 capsule 3  . dorzolamide-timolol (COSOPT) 22.3-6.8 MG/ML ophthalmic solution Place 1 drop into both eyes 2 (two) times daily.    . fexofenadine (ALLEGRA) 180 MG tablet Take 180 mg by mouth daily as needed. allergies    . latanoprost (XALATAN) 0.005 % ophthalmic solution     . losartan-hydrochlorothiazide (HYZAAR) 50-12.5 MG tablet TAKE 1  TABLET DAILY 90 tablet 3  . zaleplon (SONATA) 10 MG capsule 1 po qhs prn 30 capsule 3  . LORazepam (ATIVAN) 0.5 MG tablet Take 1 tablet (0.5 mg total) by mouth 2 (two) times daily as needed for anxiety. (Patient not taking: Reported on 06/18/2020) 30 tablet 3   No current facility-administered medications for this visit.      ___________________________________________________________________ Objective   Exam:  BP 104/60 (BP Location: Left Arm, Patient Position: Sitting, Cuff Size: Normal)   Pulse 76   Ht 5' 4.25" (1.632 m) Comment: height measured without shoes  Wt 150 lb 8 oz (68.3 kg)   BMI 25.63 kg/m  Wt Readings from Last 3 Encounters:  06/18/20 150 lb 8 oz (68.3 kg)  06/12/20 151 lb 3.2 oz (68.6 kg)  12/12/19  150 lb 6.4 oz (68.2 kg)   CMA Melissa present entire exam   General: Well-appearing  Eyes: sclera anicteric, no redness  ENT: oral mucosa moist without lesions, no cervical or supraclavicular lymphadenopathy  CV: RRR without murmur, S1/S2, no JVD, no peripheral edema  Resp: clear to auscultation bilaterally, normal RR and effort noted  GI: soft, no tenderness, with active bowel sounds. No guarding or palpable organomegaly noted.  Skin; warm and dry, no rash or jaundice noted  Neuro: awake, alert and oriented x 3. Normal gross motor function and fluent speech Rectal: normal perianal exam: NST, firm stool distal rectum, heme negative Labs:  Last routine labs from June 2021  Last abdominal imaging was an ultrasound November 2015 when Dr. Olevia Perches saw patient for suspected costochondritis  CT abdomen was apparently done January 2019 for abdominal pain and constipation, which I discovered with the following portal message from Dr. Alain Marion to Vicente Males on 04/30/2017: "Dear Clearnce Sorrel, Your abdominal CT was good overall. I have the report on paper only for some reason.  You have a mild constipation: take Senakot-S periodically. You stools are described as "fatty"  -steatorrhoea. We can try digestive enzymes if not feeling better. You can try a gluten free diet too - see below. Circumferential thickening of the pylorus of the stomach which is a nonspecific finding however it may also represent chronic gastritis.  It should not cause any pain in the lower abdomen.  If he never had a serum test for H. pylori bacteria we should probably have it done.  Let me know. Obviously there is no cancer found which is good news! AP" _______________________________   Assessment: Encounter Diagnoses  Name Primary?  . Change in bowel habits Yes  . LUQ pain     The left upper quadrant pain is musculoskeletal and I believe it is unrelated to her change in bowel habits.  She was concerned about the possibility of cancer since her grandmother had stomach cancer, but I have reassured her that I do not think this change in bowel habits is obstructive or malignancy.  She tries to stay physically active, and reports plenty of dietary fiber.  This may be from some age-related decline in motility or perhaps partial descent of the sigmoid colon into the pelvis.  No other signs or symptoms of hypothyroidism.  Plan:  Labs today: CBC, thyroid function and calcium level  Recommend Colace stool softener, 2 capsules nightly.  If not improved in a week, add one half capful of MiraLAX in a glass of water every day.  Slightly elevate feet during bowel movements. I do not feel she needs a colonoscopy or imaging at this point.  Be happy to see her again as needed, and certainly if the above treatments do not seem to be working.  Thank you for the courtesy of this consult.  Please call me with any questions or concerns.  Nelida Meuse III  CC: Referring provider noted above

## 2020-06-18 NOTE — Patient Instructions (Addendum)
If you are age 68 or older, your body mass index should be between 23-30. Your Body mass index is 25.63 kg/m. If this is out of the aforementioned range listed, please consider follow up with your Primary Care Provider.  LABS:  Lab work has been ordered for you today. Our lab is located in the basement. Press "B" on the elevator. The lab is located at the first door on the left as you exit the elevator.  HEALTHCARE LAWS AND MY CHART RESULTS: Due to recent changes in healthcare laws, you may see the results of your imaging and laboratory studies on MyChart before your provider has had a chance to review them.   We understand that in some cases there may be results that are confusing or concerning to you. Not all laboratory results come back in the same time frame and the provider may be waiting for multiple results in order to interpret others.  Please give [place] 48 hours in order for your provider to thoroughly review all the results before contacting the office for clarification of your results.   OVER THE COUNTER MEDICATION Please purchase the following medications over the counter and take as directed:  Colace stool softer 200 MG at bedtime. If this does not help after 1 week, add 1/2 capful of Miralax in 8 ounces of water daily.  RECOMMENDATIONS:  Elevated your feet on a small stool during bowel movements.

## 2020-09-13 DIAGNOSIS — Z20822 Contact with and (suspected) exposure to covid-19: Secondary | ICD-10-CM | POA: Diagnosis not present

## 2020-09-17 ENCOUNTER — Telehealth: Payer: Self-pay | Admitting: Internal Medicine

## 2020-09-17 DIAGNOSIS — R002 Palpitations: Secondary | ICD-10-CM

## 2020-09-17 DIAGNOSIS — Z0000 Encounter for general adult medical examination without abnormal findings: Secondary | ICD-10-CM

## 2020-09-17 NOTE — Telephone Encounter (Signed)
Patient is requesting that lab orders be placed before her 3 yr fu with Dr. Alain Marion on 6/9. She can be reached at [phone]. Please advise

## 2020-09-18 NOTE — Telephone Encounter (Signed)
Done. Thanks.

## 2020-09-18 NOTE — Telephone Encounter (Signed)
Called pt there was no answer LMOM MD has place orders.Marland KitchenJohny Chess

## 2020-09-24 ENCOUNTER — Other Ambulatory Visit (INDEPENDENT_AMBULATORY_CARE_PROVIDER_SITE_OTHER): Payer: PPO

## 2020-09-24 ENCOUNTER — Other Ambulatory Visit: Payer: Self-pay

## 2020-09-24 DIAGNOSIS — R002 Palpitations: Secondary | ICD-10-CM | POA: Diagnosis not present

## 2020-09-24 DIAGNOSIS — Z0000 Encounter for general adult medical examination without abnormal findings: Secondary | ICD-10-CM

## 2020-09-24 LAB — COMPREHENSIVE METABOLIC PANEL
ALT: 22 U/L (ref 0–35)
AST: 22 U/L (ref 0–37)
Albumin: 4.1 g/dL (ref 3.5–5.2)
Alkaline Phosphatase: 78 U/L (ref 39–117)
BUN: 18 mg/dL (ref 6–23)
CO2: 28 mEq/L (ref 19–32)
Calcium: 9.6 mg/dL (ref 8.4–10.5)
Chloride: 103 mEq/L (ref 96–112)
Creatinine, Ser: 0.85 mg/dL (ref 0.40–1.20)
GFR: 70.69 mL/min (ref >60.00)
Glucose, Bld: 103 mg/dL — ABNORMAL HIGH (ref 70–99)
Potassium: 4.2 mEq/L (ref 3.5–5.1)
Sodium: 139 mEq/L (ref 135–145)
Total Bilirubin: 0.4 mg/dL (ref 0.2–1.2)
Total Protein: 7 g/dL (ref 6.0–8.3)

## 2020-09-24 LAB — URINALYSIS, ROUTINE W REFLEX MICROSCOPIC
Bilirubin Urine: NEGATIVE
Hgb urine dipstick: NEGATIVE
Ketones, ur: NEGATIVE
Nitrite: NEGATIVE
RBC / HPF: NONE SEEN (ref 0–?)
Specific Gravity, Urine: 1.01 (ref 1.000–1.030)
Total Protein, Urine: NEGATIVE
Urine Glucose: NEGATIVE
Urobilinogen, UA: 0.2 (ref 0.0–1.0)
pH: 7.5 (ref 5.0–8.0)

## 2020-09-24 LAB — LIPID PANEL
Cholesterol: 217 mg/dL — ABNORMAL HIGH (ref 0–200)
HDL: 64.1 mg/dL (ref >39.00)
LDL Cholesterol: 143 mg/dL — ABNORMAL HIGH (ref 0–99)
NonHDL: 152.69
Total CHOL/HDL Ratio: 3
Triglycerides: 49 mg/dL (ref 0.0–149.0)
VLDL: 9.8 mg/dL (ref 0.0–40.0)

## 2020-09-24 LAB — CBC WITH DIFFERENTIAL/PLATELET
Basophils Absolute: 0 10*3/uL (ref 0.0–0.1)
Basophils Relative: 0.7 % (ref 0.0–3.0)
Eosinophils Absolute: 0.2 10*3/uL (ref 0.0–0.7)
Eosinophils Relative: 3.8 % (ref 0.0–5.0)
HCT: 38.5 % (ref 36.0–46.0)
Hemoglobin: 13.3 g/dL (ref 12.0–15.0)
Lymphocytes Relative: 49.1 % — ABNORMAL HIGH (ref 12.0–46.0)
Lymphs Abs: 3.1 10*3/uL (ref 0.7–4.0)
MCHC: 34.5 g/dL (ref 30.0–36.0)
MCV: 95.6 fl (ref 78.0–100.0)
Monocytes Absolute: 0.4 10*3/uL (ref 0.1–1.0)
Monocytes Relative: 5.9 % (ref 3.0–12.0)
Neutro Abs: 2.6 10*3/uL (ref 1.4–7.7)
Neutrophils Relative %: 40.5 % — ABNORMAL LOW (ref 43.0–77.0)
Platelets: 247 10*3/uL (ref 150.0–400.0)
RBC: 4.02 Mil/uL (ref 3.87–5.11)
RDW: 12.3 % (ref 11.5–15.5)
WBC: 6.3 10*3/uL (ref 4.0–10.5)

## 2020-09-24 LAB — TSH: TSH: 2.51 u[IU]/mL (ref 0.35–4.50)

## 2020-09-24 LAB — T4, FREE: Free T4: 1.08 ng/dL (ref 0.60–1.60)

## 2020-09-26 ENCOUNTER — Other Ambulatory Visit: Payer: Self-pay

## 2020-09-26 ENCOUNTER — Encounter: Payer: Self-pay | Admitting: Internal Medicine

## 2020-09-26 ENCOUNTER — Ambulatory Visit (INDEPENDENT_AMBULATORY_CARE_PROVIDER_SITE_OTHER): Payer: PPO | Admitting: Internal Medicine

## 2020-09-26 VITALS — BP 118/74 | HR 67 | Temp 97.8°F | Ht 64.25 in | Wt 149.4 lb

## 2020-09-26 DIAGNOSIS — F5101 Primary insomnia: Secondary | ICD-10-CM

## 2020-09-26 DIAGNOSIS — Z8616 Personal history of COVID-19: Secondary | ICD-10-CM | POA: Diagnosis not present

## 2020-09-26 DIAGNOSIS — R1012 Left upper quadrant pain: Secondary | ICD-10-CM | POA: Diagnosis not present

## 2020-09-26 DIAGNOSIS — Z0000 Encounter for general adult medical examination without abnormal findings: Secondary | ICD-10-CM | POA: Diagnosis not present

## 2020-09-26 MED ORDER — MECLIZINE HCL 12.5 MG PO TABS: 12.5000 mg | ORAL_TABLET | Freq: Three times a day (TID) | ORAL | 0 refills | Status: DC | PRN

## 2020-09-26 MED ORDER — TEMAZEPAM 7.5 MG PO CAPS: 7.5000 mg | ORAL_CAPSULE | Freq: Every evening | ORAL | 3 refills | Status: DC | PRN

## 2020-09-26 MED ORDER — CIPROFLOXACIN HCL 250 MG PO TABS: 250.0000 mg | ORAL_TABLET | Freq: Two times a day (BID) | ORAL | 0 refills | Status: DC

## 2020-09-26 NOTE — Patient Instructions (Addendum)
Citrucel or Metamucil  For a mild COVID-19 case you can take zinc 50 mg a day for 1 week, vitamin C 1000 mg daily for 1 week, vitamin D2 50,000 units weekly for 2 months (unless you are taking vitamin D daily already), Quercetin 500 mg twice a day for 1 week (if you can get it quick enough). Take Allegra or Benadryl.  Maintain good oral hydration and take Tylenol for high fever.

## 2020-09-26 NOTE — Assessment & Plan Note (Addendum)
  We discussed age appropriate health related issues, including available/recomended screening tests and vaccinations. Labs were ordered to be later reviewed . All questions were answered. We discussed one or more of the following - seat belt use, use of sunscreen/sun exposure exercise, safe sex, fall risk reduction, second hand smoke exposure, firearm use and storage, seat belt use, a need for adhering to healthy diet and exercise. Labs were discussed. All questions were answered. Colonoscopy - 2017 COVID-19 antibody is in preparation for her [place] trip at the patient's request Cipro for travel

## 2020-09-26 NOTE — Progress Notes (Signed)
Subjective:  Patient ID: Kristin Torres, female    DOB: 23-Aug-1950  Age: 70 y.o. 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 100 tablet 11   Biotin 5 MG CAPS Take by mouth.     CALCIUM-MAGNESIUM-ZINC PO Take 1 tablet by mouth daily.     Cholecalciferol (VITAMIN D3) 50 MCG (2000 UT) capsule Take 1 capsule (2,000 Units total) by mouth daily. 100 capsule 3   diltiazem (CARDIZEM CD) 180 MG 24 hr capsule TAKE 1 CAPSULE DAILY 90 capsule 3   dorzolamide-timolol (COSOPT) 22.3-6.8 MG/ML ophthalmic solution Place 1 drop into both eyes 2 (two) times daily.     fexofenadine (ALLEGRA) 180 MG tablet Take 180 mg by mouth daily as needed. allergies     latanoprost (XALATAN) 0.005 % ophthalmic solution      losartan-hydrochlorothiazide (HYZAAR) 50-12.5 MG tablet TAKE 1 TABLET DAILY 90 tablet 3   zaleplon (SONATA) 10 MG capsule 1 po qhs prn 30 capsule 3   BLACK COHOSH PO Take 500 mg by mouth daily. (Patient not taking: Reported on 09/26/2020)     LORazepam (ATIVAN) 0.5 MG tablet Take 1 tablet (0.5 mg total) by mouth 2 (two) times daily as needed for anxiety. (Patient not taking: No sig reported) 30 tablet 3   No facility-administered medications prior to visit.    ROS: Review of Systems  Constitutional:  Negative for activity change, appetite change, chills, fatigue and unexpected weight change.  HENT:  Negative for congestion, mouth sores and sinus pressure.   Eyes:  Negative for visual disturbance.  Respiratory:  Negative for cough and chest tightness.   Gastrointestinal:  Negative for abdominal pain and nausea.  Genitourinary:  Negative for  difficulty urinating, frequency and vaginal pain.  Musculoskeletal:  Negative for back pain and gait problem.  Skin:  Negative for pallor and rash.  Neurological:  Negative for dizziness, tremors, weakness, numbness and headaches.  Psychiatric/Behavioral:  Negative for confusion, sleep disturbance and suicidal ideas.    Objective:  BP 118/74 (BP Location: Left Arm)   Pulse 67   Temp 97.8 F (36.6 C) (Oral)   Ht 5' 4.25" (1.632 m)   Wt 149 lb 6.4 oz (67.8 kg)   SpO2 98%   BMI 25.45 kg/m   BP Readings from Last 3 Encounters:  09/26/20 118/74  06/18/20 104/60  06/12/20 104/78    Wt Readings from Last 3 Encounters:  09/26/20 149 lb 6.4 oz (67.8 kg)  06/18/20 150 lb 8 oz (68.3 kg)  06/12/20 151 lb 3.2 oz (68.6 kg)    Physical Exam Constitutional:      General: She is not in acute distress.    Appearance: She is well-developed.  HENT:     Head: Normocephalic.     Right Ear: External ear normal.     Left Ear: External ear normal.     Nose: Nose normal.  Eyes:     General:        Right eye: No discharge.        Left eye: No discharge.     Conjunctiva/sclera: Conjunctivae normal.  Pupils: Pupils are equal, round, and reactive to light.  Neck:     Thyroid: No thyromegaly.     Vascular: No JVD.     Trachea: No tracheal deviation.  Cardiovascular:     Rate and Rhythm: Normal rate and regular rhythm.     Heart sounds: Normal heart sounds.  Pulmonary:     Effort: No respiratory distress.     Breath sounds: No stridor. No wheezing.  Abdominal:     General: Bowel sounds are normal. There is no distension.     Palpations: Abdomen is soft. There is no mass.     Tenderness: There is no abdominal tenderness. There is no guarding or rebound.  Musculoskeletal:        General: No tenderness.     Cervical back: Normal range of motion and neck supple. No rigidity.  Lymphadenopathy:     Cervical: No cervical adenopathy.  Skin:    Findings: No erythema or rash.  Neurological:      Cranial Nerves: No cranial nerve deficit.     Motor: No abnormal muscle tone.     Coordination: Coordination normal.     Deep Tendon Reflexes: Reflexes normal.  Psychiatric:        Behavior: Behavior normal.        Thought Content: Thought content normal.        Judgment: Judgment normal.    Lab Results  Component Value Date   WBC 6.3 09/24/2020   HGB 13.3 09/24/2020   HCT 38.5 09/24/2020   PLT 247.0 09/24/2020   GLUCOSE 103 (H) 09/24/2020   CHOL 217 (H) 09/24/2020   TRIG 49.0 09/24/2020   HDL 64.10 09/24/2020   LDLCALC 143 (H) 09/24/2020   ALT 22 09/24/2020   AST 22 09/24/2020   NA 139 09/24/2020   K 4.2 09/24/2020   CL 103 09/24/2020   CREATININE 0.85 09/24/2020   BUN 18 09/24/2020   CO2 28 09/24/2020   TSH 2.51 09/24/2020   HGBA1C 5.5 09/27/2014    US Abdomen Complete  Result Date: 02/27/2014 CLINICAL DATA:  Left upper quadrant pain EXAM: ULTRASOUND ABDOMEN COMPLETE COMPARISON:  None. FINDINGS: Gallbladder: No gallstones or wall thickening visualized. No sonographic Murphy sign noted. Common bile duct: Diameter: 3.3 mm Liver: Echogenic lesion in the right lobe of liver measures 7 x 7 x 8 mm. IVC: No abnormality visualized. Pancreas: Visualized portion unremarkable. Spleen: Size and appearance within normal limits. Right Kidney: Length: 10.9 cm. Echogenicity within normal limits. No mass or hydronephrosis visualized. Left Kidney: Length: 10.2 cm. Echogenicity within normal limits. No mass or hydronephrosis visualized. Abdominal aorta: No aneurysm visualized. Other findings: None. IMPRESSION: 1. No acute findings identified. 2. 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 In the absence of known malignancy a followup examination in 6 months with ultrasound is recommended to confirm stability and bus benignity. If there is a history of known malignancy then further assessment with contrast enhanced liver MRI would be recommended.  Electronically Signed   By: Kerby Moors M.D.   On: 02/27/2014 09:12    Assessment & Plan:    Follow-up: No follow-ups on file.  Walker Kehr, MD

## 2020-09-30 DIAGNOSIS — G4700 Insomnia, unspecified: Secondary | ICD-10-CM | POA: Insufficient documentation

## 2020-09-30 NOTE — Assessment & Plan Note (Signed)
Chronic/recurrent.  Will obtain abdominal ultrasound

## 2020-09-30 NOTE — Assessment & Plan Note (Signed)
Chronic.  Options discussed.  She would like to continue with as needed zaleplon

## 2020-10-08 DIAGNOSIS — Z1231 Encounter for screening mammogram for malignant neoplasm of breast: Secondary | ICD-10-CM | POA: Diagnosis not present

## 2020-10-10 LAB — HM MAMMOGRAPHY

## 2020-10-11 ENCOUNTER — Encounter: Payer: Self-pay | Admitting: Internal Medicine

## 2020-10-17 ENCOUNTER — Ambulatory Visit
Admission: RE | Admit: 2020-10-17 | Discharge: 2020-10-17 | Disposition: A | Discharge status: Home / Self Care | Payer: PPO | Source: Ambulatory Visit | Attending: Internal Medicine | Admitting: Internal Medicine

## 2020-10-17 DIAGNOSIS — R1012 Left upper quadrant pain: Secondary | ICD-10-CM | POA: Diagnosis not present

## 2020-11-12 ENCOUNTER — Ambulatory Visit: Payer: PPO | Admitting: Dermatology

## 2020-11-13 ENCOUNTER — Other Ambulatory Visit: Payer: Self-pay

## 2020-11-13 ENCOUNTER — Ambulatory Visit: Payer: PPO | Admitting: Dermatology

## 2020-11-13 DIAGNOSIS — L821 Other seborrheic keratosis: Secondary | ICD-10-CM | POA: Diagnosis not present

## 2020-11-13 DIAGNOSIS — Z1283 Encounter for screening for malignant neoplasm of skin: Secondary | ICD-10-CM | POA: Diagnosis not present

## 2020-11-17 ENCOUNTER — Other Ambulatory Visit: Payer: Self-pay | Admitting: Internal Medicine

## 2020-11-19 DIAGNOSIS — Z20822 Contact with and (suspected) exposure to covid-19: Secondary | ICD-10-CM | POA: Diagnosis not present

## 2020-12-02 ENCOUNTER — Encounter: Payer: Self-pay | Admitting: Dermatology

## 2020-12-02 NOTE — Progress Notes (Signed)
   Follow-Up Visit   Subjective  Kristin Torres is a 70 y.o. female who presents for the following: Skin Problem (Here to have her birth marks checked. On her right lower abdomen area. Also has some lesions on bottom leg she isn't sure is veins or skin problem. They kinda feel bruised. Ozzie Hoyle of atypical moles. ).  Annual skin examination, several areas to check Location:  Duration:  Quality:  Associated Signs/Symptoms: Modifying Factors:  Severity:  Timing: Context:   Objective  Well appearing patient in no apparent distress; mood and affect are within normal limits. Head to toe General skin examination.  No atypical pigmented lesions or nonmelanoma skin cancer.  Right Thigh - Anterior 9m brown textured flattopped papule; typical dermoscopy.    A full examination was performed including scalp, head, eyes, ears, nose, lips, neck, chest, axillae, abdomen, back, buttocks, bilateral upper extremities, bilateral lower extremities, hands, feet, fingers, toes, fingernails, and toenails. All findings within normal limits unless otherwise noted below.  Areas beneath undergarments not fully examined.   Assessment & Plan    Skin exam for malignant neoplasm Head to toe  Yearly skin cancer.  Encouraged to self examine twice annually.  Continued ultraviolet protection.  Seborrheic keratosis Right Thigh - Anterior  Benign okay to leave unless patient wants removed or clinically changes.      I, SLavonna Monarch MD, have reviewed all documentation for this visit.  The documentation on 12/02/20 for the exam, diagnosis, procedures, and orders are all accurate and complete.

## 2020-12-08 ENCOUNTER — Other Ambulatory Visit: Payer: Self-pay | Admitting: Internal Medicine

## 2020-12-13 DIAGNOSIS — H401222 Low-tension glaucoma, left eye, moderate stage: Secondary | ICD-10-CM | POA: Diagnosis not present

## 2020-12-13 DIAGNOSIS — H52223 Regular astigmatism, bilateral: Secondary | ICD-10-CM | POA: Diagnosis not present

## 2020-12-13 DIAGNOSIS — H401211 Low-tension glaucoma, right eye, mild stage: Secondary | ICD-10-CM | POA: Diagnosis not present

## 2020-12-13 DIAGNOSIS — H43811 Vitreous degeneration, right eye: Secondary | ICD-10-CM | POA: Diagnosis not present

## 2020-12-13 DIAGNOSIS — H2513 Age-related nuclear cataract, bilateral: Secondary | ICD-10-CM | POA: Diagnosis not present

## 2020-12-13 DIAGNOSIS — Z83511 Family history of glaucoma: Secondary | ICD-10-CM | POA: Diagnosis not present

## 2020-12-13 DIAGNOSIS — H5213 Myopia, bilateral: Secondary | ICD-10-CM | POA: Diagnosis not present

## 2020-12-13 DIAGNOSIS — H524 Presbyopia: Secondary | ICD-10-CM | POA: Diagnosis not present

## 2020-12-17 DIAGNOSIS — N952 Postmenopausal atrophic vaginitis: Secondary | ICD-10-CM | POA: Diagnosis not present

## 2020-12-17 DIAGNOSIS — Z01419 Encounter for gynecological examination (general) (routine) without abnormal findings: Secondary | ICD-10-CM | POA: Diagnosis not present

## 2020-12-17 DIAGNOSIS — R6882 Decreased libido: Secondary | ICD-10-CM | POA: Diagnosis not present

## 2020-12-17 DIAGNOSIS — Z124 Encounter for screening for malignant neoplasm of cervix: Secondary | ICD-10-CM | POA: Diagnosis not present

## 2020-12-17 DIAGNOSIS — N951 Menopausal and female climacteric states: Secondary | ICD-10-CM | POA: Diagnosis not present

## 2020-12-17 DIAGNOSIS — Z6825 Body mass index (BMI) 25.0-25.9, adult: Secondary | ICD-10-CM | POA: Diagnosis not present

## 2020-12-17 DIAGNOSIS — Z01411 Encounter for gynecological examination (general) (routine) with abnormal findings: Secondary | ICD-10-CM | POA: Diagnosis not present

## 2020-12-19 ENCOUNTER — Other Ambulatory Visit: Payer: Self-pay

## 2020-12-19 ENCOUNTER — Ambulatory Visit (INDEPENDENT_AMBULATORY_CARE_PROVIDER_SITE_OTHER): Payer: PPO | Admitting: Dermatology

## 2020-12-19 ENCOUNTER — Encounter: Payer: Self-pay | Admitting: Dermatology

## 2020-12-19 DIAGNOSIS — L570 Actinic keratosis: Secondary | ICD-10-CM | POA: Diagnosis not present

## 2020-12-19 DIAGNOSIS — L821 Other seborrheic keratosis: Secondary | ICD-10-CM | POA: Diagnosis not present

## 2020-12-19 DIAGNOSIS — L820 Inflamed seborrheic keratosis: Secondary | ICD-10-CM

## 2020-12-26 DIAGNOSIS — M8589 Other specified disorders of bone density and structure, multiple sites: Secondary | ICD-10-CM | POA: Diagnosis not present

## 2020-12-26 DIAGNOSIS — Z780 Asymptomatic menopausal state: Secondary | ICD-10-CM | POA: Diagnosis not present

## 2020-12-26 LAB — HM DEXA SCAN

## 2020-12-28 ENCOUNTER — Encounter: Payer: Self-pay | Admitting: Dermatology

## 2020-12-28 NOTE — Progress Notes (Signed)
   Follow-Up Visit   Subjective  Kristin Torres is a 70 y.o. female who presents for the following: Procedure (Patient's here today for possible removal of two lesions on her hands. ).  Several crusts on the arms and torso to check Location:  Duration:  Quality:  Associated Signs/Symptoms: Modifying Factors:  Severity:  Timing: Context:   Objective  Well appearing patient in no apparent distress; mood and affect are within normal limits. Mid Back Half dozen scattered noninflamed 3 to 5 mm light brown flattopped textured papules  Left Dorsal Hand, Right 2nd Finger Metacarpophalangeal Joint 4 mm hornlike pink crusts    A focused examination was performed including arms and back. Relevant physical exam findings are noted in the Assessment and Plan.   Assessment & Plan    Seborrheic keratosis Mid Back  No intervention necessary  Actinic keratoses (2) Left Dorsal Hand; Right 2nd Finger Metacarpophalangeal Joint  Destruction of lesion - Left Dorsal Hand Complexity: simple   Destruction method: cryotherapy   Informed consent: discussed and consent obtained   Timeout:  patient name, date of birth, surgical site, and procedure verified Lesion destroyed using liquid nitrogen: Yes   Cryotherapy cycles:  4 Outcome: patient tolerated procedure well with no complications   Post-procedure details: wound care instructions given        I, Lavonna Monarch, MD, have reviewed all documentation for this visit.  The documentation on 12/28/20 for the exam, diagnosis, procedures, and orders are all accurate and complete.

## 2021-01-01 MED ORDER — CLOBETASOL PROPIONATE 0.05 % EX OINT: 1.0000 "application " | TOPICAL_OINTMENT | Freq: Two times a day (BID) | CUTANEOUS | 0 refills | Status: DC

## 2021-01-01 NOTE — Addendum Note (Signed)
Addended by: Lavonna Monarch on: 01/01/2021 12:56 PM   Modules accepted: Orders

## 2021-01-03 ENCOUNTER — Encounter: Payer: Self-pay | Admitting: Internal Medicine

## 2021-02-10 DIAGNOSIS — Z20822 Contact with and (suspected) exposure to covid-19: Secondary | ICD-10-CM | POA: Diagnosis not present

## 2021-02-13 ENCOUNTER — Other Ambulatory Visit: Payer: Self-pay | Admitting: Internal Medicine

## 2021-02-27 ENCOUNTER — Encounter: Payer: PPO | Admitting: Dermatology

## 2021-03-05 ENCOUNTER — Other Ambulatory Visit: Payer: Self-pay | Admitting: Internal Medicine

## 2021-03-05 DIAGNOSIS — G479 Sleep disorder, unspecified: Secondary | ICD-10-CM

## 2021-03-05 DIAGNOSIS — F5101 Primary insomnia: Secondary | ICD-10-CM

## 2021-03-28 ENCOUNTER — Telehealth: Payer: Self-pay | Admitting: Internal Medicine

## 2021-03-28 MED ORDER — ZALEPLON 10 MG PO CAPS: ORAL_CAPSULE | ORAL | 0 refills | Status: DC

## 2021-03-28 NOTE — Telephone Encounter (Signed)
1.Medication Requested: zaleplon (SONATA) 10 MG capsule 2. Pharmacy (Name, [place] [place]): [id number] - , [place] Phone:  [phone]  Fax:  [phone]     3. On Med List: Y  4. Last Visit with PCP: 09/26/2020  5. Next visit date with PCP:04/08/2021   Agent: Please be advised that RX refills may take up to 3 business days. We ask that you follow-up with your pharmacy.

## 2021-03-31 ENCOUNTER — Ambulatory Visit: Payer: PPO | Admitting: Internal Medicine

## 2021-04-08 ENCOUNTER — Other Ambulatory Visit: Payer: Self-pay

## 2021-04-08 ENCOUNTER — Encounter: Payer: Self-pay | Admitting: Internal Medicine

## 2021-04-08 ENCOUNTER — Ambulatory Visit (INDEPENDENT_AMBULATORY_CARE_PROVIDER_SITE_OTHER): Payer: PPO | Admitting: Internal Medicine

## 2021-04-08 ENCOUNTER — Ambulatory Visit: Payer: PPO | Admitting: Internal Medicine

## 2021-04-08 DIAGNOSIS — R1310 Dysphagia, unspecified: Secondary | ICD-10-CM

## 2021-04-08 DIAGNOSIS — R002 Palpitations: Secondary | ICD-10-CM | POA: Diagnosis not present

## 2021-04-08 DIAGNOSIS — I10 Essential (primary) hypertension: Secondary | ICD-10-CM

## 2021-04-08 DIAGNOSIS — G4700 Insomnia, unspecified: Secondary | ICD-10-CM | POA: Diagnosis not present

## 2021-04-08 MED ORDER — ESCITALOPRAM OXALATE 5 MG PO TABS: 5.0000 mg | ORAL_TABLET | Freq: Every day | ORAL | 5 refills | Status: AC

## 2021-04-08 NOTE — Assessment & Plan Note (Signed)
Chronic.  Options discussed.  Kristin Torres would like to continue with as needed Zaleplon. May need to use Restoril in place of Zaleplon

## 2021-04-08 NOTE — Assessment & Plan Note (Signed)
Cont on Hyzaar, Diltiazem 

## 2021-04-08 NOTE — Assessment & Plan Note (Signed)
palpitations - resolved after ablation

## 2021-04-08 NOTE — Progress Notes (Signed)
Subjective:  Patient ID: Kristin Torres, female    DOB: Aug 02, 1950  Age: 70 y.o. MRN: [id number]  CC: Follow-up (6 month f/u)   HPI Kristin Torres presents for palpitations - resolved after ablation, insomnia C/o panic attacks  Outpatient Medications Prior to Visit  Medication Sig Dispense Refill   aspirin (ASPIRIN CHILDRENS) 81 MG chewable tablet Chew 1 tablet (81 mg total) by mouth daily. 100 tablet 11   CALCIUM-MAGNESIUM-ZINC PO Take 1 tablet by mouth daily.     Cholecalciferol (VITAMIN D3) 50 MCG (2000 UT) capsule Take 1 capsule (2,000 Units total) by mouth daily. 100 capsule 3   clobetasol ointment (TEMOVATE) 3.81 % Apply 1 application topically 2 (two) times daily. 30 g 0   diltiazem (CARDIZEM CD) 180 MG 24 hr capsule TAKE 1 CAPSULE BY MOUTH EVERY DAY 90 capsule 2   dorzolamide-timolol (COSOPT) 22.3-6.8 MG/ML ophthalmic solution Place 1 drop into both eyes 2 (two) times daily.     estradiol (ESTRACE) 0.1 MG/GM vaginal cream Place vaginally.     fexofenadine (ALLEGRA) 180 MG tablet Take 180 mg by mouth daily as needed. allergies     latanoprost (XALATAN) 0.005 % ophthalmic solution      losartan-hydrochlorothiazide (HYZAAR) 50-12.5 MG tablet TAKE 1 TABLET BY MOUTH EVERY DAY 90 tablet 1   meclizine (ANTIVERT) 12.5 MG tablet TAKE 1 TABLET BY MOUTH 3 TIMES DAILY AS NEEDED FOR DIZZINESS. 60 tablet 0   temazepam (RESTORIL) 7.5 MG capsule Take 1-2 capsules (7.5-15 mg total) by mouth at bedtime as needed for sleep. 60 capsule 3   zaleplon (SONATA) 10 MG capsule 1 po qhs prn 30 capsule 0   Biotin 5 MG CAPS Take by mouth.     ciprofloxacin (CIPRO) 250 MG tablet Take 1 tablet (250 mg total) by mouth 2 (two) times daily. (Patient not taking: Reported on 04/08/2021) 20 tablet 0   PFIZER-BIONT COVID-19 VAC-TRIS SUSP injection  (Patient not taking: Reported on 04/08/2021)     No facility-administered medications prior to visit.    ROS: Review of Systems  Constitutional:  Positive for chills.  Negative for activity change, appetite change, fatigue and unexpected weight change.  HENT:  Negative for congestion, mouth sores and sinus pressure.   Eyes:  Negative for visual disturbance.  Respiratory:  Negative for cough and chest tightness.   Gastrointestinal:  Negative for abdominal pain and nausea.  Genitourinary:  Negative for difficulty urinating, frequency and vaginal pain.  Musculoskeletal:  Negative for back pain and gait problem.  Skin:  Negative for pallor and rash.  Neurological:  Negative for dizziness, tremors, weakness, numbness and headaches.  Psychiatric/Behavioral:  Positive for sleep disturbance. Negative for confusion and suicidal ideas. The patient is nervous/anxious.    Objective:  BP 110/72 (BP Location: Left Arm)    Pulse 63    Temp 97.8 F (36.6 C) (Oral)    Ht 5' 4.5" (1.638 m)    Wt 151 lb 12.8 oz (68.9 kg)    SpO2 98%    BMI 25.65 kg/m   BP Readings from Last 3 Encounters:  04/08/21 110/72  09/26/20 118/74  06/18/20 104/60    Wt Readings from Last 3 Encounters:  04/08/21 151 lb 12.8 oz (68.9 kg)  09/26/20 149 lb 6.4 oz (67.8 kg)  06/18/20 150 lb 8 oz (68.3 kg)    Physical Exam Constitutional:      General: She is not in acute distress.    Appearance: She is well-developed.  HENT:  Head: Normocephalic.     Right Ear: External ear normal.     Left Ear: External ear normal.     Nose: Nose normal.  Eyes:     General:        Right eye: No discharge.        Left eye: No discharge.     Conjunctiva/sclera: Conjunctivae normal.     Pupils: Pupils are equal, round, and reactive to light.  Neck:     Thyroid: No thyromegaly.     Vascular: No JVD.     Trachea: No tracheal deviation.  Cardiovascular:     Rate and Rhythm: Normal rate and regular rhythm.     Heart sounds: Normal heart sounds.  Pulmonary:     Effort: No respiratory distress.     Breath sounds: No stridor. No wheezing.  Abdominal:     General: Bowel sounds are normal. There is no  distension.     Palpations: Abdomen is soft. There is no mass.     Tenderness: There is no abdominal tenderness. There is no guarding or rebound.  Musculoskeletal:     Cervical back: Normal range of motion and neck supple. No rigidity.  Lymphadenopathy:     Cervical: No cervical adenopathy.  Skin:    Findings: No erythema or rash.  Neurological:     Mental Status: She is oriented to person, place, and time.     Cranial Nerves: No cranial nerve deficit.     Motor: No abnormal muscle tone.     Coordination: Coordination normal.     Deep Tendon Reflexes: Reflexes normal.  Psychiatric:        Behavior: Behavior normal.        Thought Content: Thought content normal.        Judgment: Judgment normal.    Lab Results  Component Value Date   WBC 6.3 09/24/2020   HGB 13.3 09/24/2020   HCT 38.5 09/24/2020   PLT 247.0 09/24/2020   GLUCOSE 103 (H) 09/24/2020   CHOL 217 (H) 09/24/2020   TRIG 49.0 09/24/2020   HDL 64.10 09/24/2020   LDLCALC 143 (H) 09/24/2020   ALT 22 09/24/2020   AST 22 09/24/2020   NA 139 09/24/2020   K 4.2 09/24/2020   CL 103 09/24/2020   CREATININE 0.85 09/24/2020   BUN 18 09/24/2020   CO2 28 09/24/2020   TSH 2.51 09/24/2020   HGBA1C 5.5 09/27/2014    US Abdomen Complete  Result Date: 10/18/2020 CLINICAL DATA:  Postprandial left upper quadrant abdominal pain. EXAM: ABDOMEN ULTRASOUND COMPLETE COMPARISON:  Ultrasound February 27, 2014 FINDINGS: Gallbladder: No gallstones or wall thickening visualized. No sonographic Murphy sign noted by sonographer. Common bile duct: Diameter: 3 mm Liver: No focal lesion identified. The previously visualized probable hemangioma in the right lobe of the liver is not seen on today's examination. Within normal limits in parenchymal echogenicity. Portal vein is patent on color Doppler imaging with normal direction of blood flow towards the liver. IVC: No abnormality visualized. Pancreas: Visualized portion unremarkable. Spleen: Size and  appearance within normal limits. Right Kidney: Length: 10.4 cm. Echogenicity within normal limits. No mass or hydronephrosis visualized. Left Kidney: Length: 10.5 cm. Echogenicity within normal limits. No mass or hydronephrosis visualized. Abdominal aorta: No aneurysm visualized. Other findings: None. IMPRESSION: Unremarkable abdominal ultrasound. Specifically no sonographic finding to explain patient's left upper quadrant pain. Electronically Signed   By: Dahlia Bailiff MD   On: 10/18/2020 09:22    Assessment & Plan:  Problem List Items Addressed This Visit     Dysphagia    Ref to Dr Loletha Carrow      Relevant Orders   Ambulatory referral to Gastroenterology   HTN (hypertension)    Cont on Hyzaar, Diltiazem      Insomnia    Chronic.  Options discussed.  Malyia would like to continue with as needed Zaleplon. May need to use Restoril in place of Zaleplon      Palpitations     palpitations - resolved after ablation      Relevant Orders   Comprehensive metabolic panel   CBC with Differential/Platelet   TSH      Meds ordered this encounter  Medications   escitalopram (LEXAPRO) 5 MG tablet    Sig: Take 1 tablet (5 mg total) by mouth at bedtime.    Dispense:  30 tablet    Refill:  5      Follow-up: Return in about 3 months (around 07/07/2021) for a follow-up visit.  Walker Kehr, MD

## 2021-04-08 NOTE — Assessment & Plan Note (Signed)
Ref to Dr Loletha Carrow

## 2021-04-22 ENCOUNTER — Ambulatory Visit: Payer: PPO | Admitting: Dermatology

## 2021-04-23 ENCOUNTER — Other Ambulatory Visit (INDEPENDENT_AMBULATORY_CARE_PROVIDER_SITE_OTHER): Payer: PPO

## 2021-04-23 DIAGNOSIS — R002 Palpitations: Secondary | ICD-10-CM

## 2021-04-23 LAB — COMPREHENSIVE METABOLIC PANEL
ALT: 26 U/L (ref 0–35)
AST: 24 U/L (ref 0–37)
Albumin: 4.1 g/dL (ref 3.5–5.2)
Alkaline Phosphatase: 74 U/L (ref 39–117)
BUN: 21 mg/dL (ref 6–23)
CO2: 31 mEq/L (ref 19–32)
Calcium: 9.8 mg/dL (ref 8.4–10.5)
Chloride: 103 mEq/L (ref 96–112)
Creatinine, Ser: 0.79 mg/dL (ref 0.40–1.20)
GFR: 76.87 mL/min (ref >60.00)
Glucose, Bld: 102 mg/dL — ABNORMAL HIGH (ref 70–99)
Potassium: 4 mEq/L (ref 3.5–5.1)
Sodium: 140 mEq/L (ref 135–145)
Total Bilirubin: 0.5 mg/dL (ref 0.2–1.2)
Total Protein: 7.2 g/dL (ref 6.0–8.3)

## 2021-04-23 LAB — CBC WITH DIFFERENTIAL/PLATELET
Basophils Absolute: 0 10*3/uL (ref 0.0–0.1)
Basophils Relative: 0.8 % (ref 0.0–3.0)
Eosinophils Absolute: 0.2 10*3/uL (ref 0.0–0.7)
Eosinophils Relative: 4 % (ref 0.0–5.0)
HCT: 38.6 % (ref 36.0–46.0)
Hemoglobin: 13.1 g/dL (ref 12.0–15.0)
Lymphocytes Relative: 44.8 % (ref 12.0–46.0)
Lymphs Abs: 2.6 10*3/uL (ref 0.7–4.0)
MCHC: 33.9 g/dL (ref 30.0–36.0)
MCV: 95.5 fl (ref 78.0–100.0)
Monocytes Absolute: 0.4 10*3/uL (ref 0.1–1.0)
Monocytes Relative: 7.3 % (ref 3.0–12.0)
Neutro Abs: 2.5 10*3/uL (ref 1.4–7.7)
Neutrophils Relative %: 43.1 % (ref 43.0–77.0)
Platelets: 228 10*3/uL (ref 150.0–400.0)
RBC: 4.04 Mil/uL (ref 3.87–5.11)
RDW: 12.7 % (ref 11.5–15.5)
WBC: 5.8 10*3/uL (ref 4.0–10.5)

## 2021-04-23 LAB — TSH: TSH: 2.65 u[IU]/mL (ref 0.35–5.50)

## 2021-04-28 ENCOUNTER — Ambulatory Visit: Payer: PPO | Admitting: Dermatology

## 2021-05-14 ENCOUNTER — Other Ambulatory Visit: Payer: Self-pay | Admitting: Internal Medicine

## 2021-05-23 ENCOUNTER — Ambulatory Visit: Payer: PPO | Admitting: Gastroenterology

## 2021-06-05 ENCOUNTER — Encounter: Payer: Self-pay | Admitting: Neurology

## 2021-06-05 ENCOUNTER — Ambulatory Visit (INDEPENDENT_AMBULATORY_CARE_PROVIDER_SITE_OTHER): Payer: PPO | Admitting: Neurology

## 2021-06-05 VITALS — BP 104/61 | HR 70 | Ht 64.57 in | Wt 151.5 lb

## 2021-06-05 DIAGNOSIS — R0683 Snoring: Secondary | ICD-10-CM

## 2021-06-05 DIAGNOSIS — N951 Menopausal and female climacteric states: Secondary | ICD-10-CM | POA: Diagnosis not present

## 2021-06-05 MED ORDER — ZALEPLON 5 MG PO CAPS: 5.0000 mg | ORAL_CAPSULE | Freq: Every evening | ORAL | 0 refills | Status: DC | PRN

## 2021-06-05 NOTE — Progress Notes (Signed)
SLEEP MEDICINE CLINIC    Provider:  Larey Seat, MD  Primary Care Physician:  Plotnikov, Evie Lacks, MD [place] [address]     Referring Provider: Cassandria Anger, [place],  [place] [address]          Chief Complaint according to patient   Patient presents with:     New Patient (Initial Visit)      Internal referral for insomnia. 1-2x a week pt has trouble falling asleep or waking up middle of night unable to fall back asleep. Goes to bed at 10pm and wakes up at 8am. Pt has no fatigue or daytime sleepiness does not feel like she needs to nap.  She uses sonata generic to help to sleep. Short acting.       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   Chief concern according to patient :  2 times a week, I have trouble to sleep and will take sonata. It works.  The patient was also on TEMAZEPAM. She is not taking it .     I have the pleasure of seeing Kristin Torres on 06-05-2021 a right -handed Caucasian female with a possible sleep disorder.   She has a past medical history of Cervicalgia, Chronic back pain, Dermatophytosis of nail, GERD (gastroesophageal reflux disease), History of pneumonia, Hypertension, Leg cramps, MI (myocardial infarction) ([facility]), Nocturia, Personal history of other diseases of circulatory system, PSVT (paroxysmal supraventricular tachycardia) ([place]), Sinusitis, SVT (supraventricular tachycardia) ([facility]), Tubal pregnancy, and Tubular adenoma of colon. She contracted COVID in august 2022- during a [place] trip.  Atrial fibrillation, ablation in 2007- after she had an event in [place].      Sleep relevant medical history: Nocturia 1-2 - sometimes trouble to fall back asleep-, no Sleep walking, no Tonsillectomy, cervical spine surgery, no deviated septum.    Family medical /sleep history: no  other family member on CPAP with OSA, insomnia, no sleep walkers.    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  and lives in a household with spouse. Family status is married , with two adult sons, 4 grandchildren.  The patient currently works on Teacher, music. Tobacco use: never.   ETOH use - once or twice a week ,  Caffeine intake ;none since atrial fib.  Regular exercise in form of : Gym, zoomba, yoga 5 times a week,, walking.  Hobbies :cross country skiing.       Sleep habits are as follows: The patient's dinner time is between 6 PM. The patient goes to bed at 10 PM and the bedroom is cool, quiet. Her husband reads in bed, she reads on a kindle- if she falls asleep, she continues to sleep for 6-8 hours, wakes for 1-2 bathroom breaks.   The preferred sleep position is right sided , with the support of one pillow. Firm , flat pillow. No GERD, no SOB.  Dreams are reportedly frequent/sometimes vivid.  7 -8 AM is the usual rise time. The patient wakes up spontaneously.  She reports not feeling refreshed or restored in AM, with symptoms such as dry mouth, dry nose, epistaxis before she used vaseline, no morning headaches, and residual fatigue.  Naps are not taken -they interfere with nocturnal sleep.  Review of Systems: Out of a complete 14 system review, the patient complains of only the following symptoms, and all other reviewed systems are negative.:  Fatigue, sleepiness , snoring, fragmented sleep, Insomnia- busy , mind is busy when she goes to bed- she reads in bed on a kindle.      How likely are you to doze in the following situations: 0 = not likely, 1 = slight chance, 2 = moderate chance, 3 = high chance   Sitting and Reading? Watching Television? Sitting inactive in a public place (theater or meeting)? As a passenger  in a car for an hour without a break? Lying down in the afternoon when circumstances permit? Sitting and talking to someone? Sitting quietly after lunch without alcohol? In a car, while stopped for a few minutes in traffic?   Total = 2 / 24 points   FSS endorsed at na/ 63 points.   Social History   Socioeconomic History   Marital status: Married    Spouse name: Broadus John   Number of children: 2   Years of education: Not on file   Highest education level: Professional MD  Occupational History   Occupation: microbiologist/retired MD    Employer: [employer]  Tobacco Use   Smoking status: Never   Smokeless tobacco: Never  Vaping Use   Vaping Use: Never used  Substance and Sexual Activity   Alcohol use: Yes    Alcohol/week: 3.0 standard drinks    Types: 3 Glasses of wine per week    Comment: 1 per day   Drug use: No   Sexual activity: Not on file  Other Topics Concern   Not on file  Social History Narrative   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: Architectural technologist in private interpriseMarried '752 sons '76, '83 Marriage in good health- Hobbies avid skier   Right handed   Caffeine: none since ablation   Social Determinants of Health   Financial Resource Strain: Not on file  Food Insecurity: Not on file  Transportation Needs: Not on file  Physical Activity: Not on file  Stress: Not on file  Social Connections: Not on file    Family History  Problem Relation Age of Onset   Stomach cancer Paternal Grandmother    Breast cancer Maternal Grandmother    Diabetes Maternal Grandmother    Hypertension Maternal Grandmother    Diabetes Mother    Hypertension Mother    Glaucoma Mother    Heart attack Father 77   Glaucoma Brother    Hypertension Maternal Grandfather    Colon cancer Neg Hx     Past Medical History:  Diagnosis Date   Cervicalgia    Chronic back pain    Dermatophytosis of nail    GERD (gastroesophageal reflux disease)    History of pneumonia     Hypertension    Leg cramps    MI (myocardial infarction) ([facility])    Nocturia    Personal history of other diseases of circulatory system    PSVT (paroxysmal supraventricular tachycardia) ([facility])    Sinusitis    SVT (supraventricular tachycardia) ([facility])    Tubal pregnancy    Tubular adenoma of colon     Past Surgical History:  Procedure Laterality Date   ABLATION OF DYSRHYTHMIC FOCUS  04/25/2014   svt   APPENDECTOMY     BREAST EXCISIONAL BIOPSY Right 2011   benign  right breast   OOPHORECTOMY  POLYPECTOMY     SUPRAVENTRICULAR TACHYCARDIA ABLATION N/A 04/25/2014   Procedure: SUPRAVENTRICULAR TACHYCARDIA ABLATION;  Surgeon: Evans Lance, MD;  [facility] C[facility] LAB;  Service: Cardiovascular;  Laterality: N/A;   TUBAL LIGATION       Current Outpatient Medications on File Prior to Visit  Medication Sig Dispense Refill   aspirin (ASPIRIN CHILDRENS) 81 MG chewable tablet Chew 1 tablet (81 mg total) by mouth daily. 100 tablet 11   CALCIUM-MAGNESIUM-ZINC PO Take 1 tablet by mouth daily.     Cholecalciferol (VITAMIN D3) 50 MCG (2000 UT) capsule Take 1 capsule (2,000 Units total) by mouth daily. 100 capsule 3   clobetasol ointment (TEMOVATE) 1.75 % Apply 1 application topically 2 (two) times daily. 30 g 0   diltiazem (CARDIZEM CD) 180 MG 24 hr capsule TAKE 1 CAPSULE BY MOUTH EVERY DAY 90 capsule 2   dorzolamide-timolol (COSOPT) 22.3-6.8 MG/ML ophthalmic solution Place 1 drop into both eyes 2 (two) times daily.     escitalopram (LEXAPRO) 5 MG tablet Take 1 tablet (5 mg total) by mouth at bedtime. 30 tablet 5   estradiol (ESTRACE) 0.1 MG/GM vaginal cream Place vaginally.     fexofenadine (ALLEGRA) 180 MG tablet Take 180 mg by mouth daily as needed. allergies     latanoprost (XALATAN) 0.005 % ophthalmic solution      losartan-hydrochlorothiazide (HYZAAR) 50-12.5 MG tablet TAKE 1 TABLET BY MOUTH EVERY DAY 90 tablet 3   meclizine (ANTIVERT) 12.5 MG tablet TAKE 1 TABLET BY MOUTH 3 TIMES DAILY  AS NEEDED FOR DIZZINESS. 60 tablet 0   temazepam (RESTORIL) 7.5 MG capsule Take 1-2 capsules (7.5-15 mg total) by mouth at bedtime as needed for sleep. 60 capsule 3   zaleplon (SONATA) 10 MG capsule 1 po qhs prn 30 capsule 0   No current facility-administered medications on file prior to visit.    Allergies  Allergen Reactions   Bee Venom Anaphylaxis   Streptomycin Rash   Mango Flavor Swelling   Caffeine Palpitations   Other Rash   Penicillins Rash    Physical exam:  Today's Vitals   06/05/21 1444  BP: 104/61  Pulse: 70  Weight: 151 lb 8 oz (68.7 kg)  Height: 5' 4.57" (1.64 m)   Body mass index is 25.55 kg/m.   Wt Readings from Last 3 Encounters:  06/05/21 151 lb 8 oz (68.7 kg)  04/08/21 151 lb 12.8 oz (68.9 kg)  09/26/20 149 lb 6.4 oz (67.8 kg)     Ht Readings from Last 3 Encounters:  06/05/21 5' 4.57" (1.64 m)  04/08/21 5' 4.5" (1.638 m)  09/26/20 5' 4.25" (1.632 m)      General: The patient is awake, alert and appears not in acute distress. The patient is well groomed. Head: Normocephalic, atraumatic. Neck is supple.  Mallampati 3 ,  neck circumference:14 inches .  Nasal airflow  patent.  Retrognathia is seen.  Dental status: used to wear braces. ( Her father was a Pharmacist, community, mother a neurologist, grandmother a psychiatrist, maternal grandmother was a Landscape architect.) Cardiovascular:  Regular rate and cardiac rhythm by pulse, without distended neck veins. Respiratory: Lungs are clear to auscultation.  Skin:  Without evidence of ankle edema, or rash. Trunk: The patient's posture is erect.   Neurologic exam : The patient is awake and alert, oriented to place and time.   Memory subjective described as intact.  Attention span & concentration ability appears normal.  Speech is fluent,  without  dysarthria, dysphonia or  aphasia.  Mood and affect are appropriate.   Cranial nerves: no loss of smell or taste reported .  Pupils are equal and briskly reactive  to light. Funduscopic exam deferred. .  Extraocular movements in vertical and horizontal planes were intact and without nystagmus.  No Diplopia. Visual fields by finger perimetry are intact. Hearing was intact to soft voice and finger rubbing.    Facial sensation intact to fine touch.  Facial motor strength is symmetric and tongue and uvula move midline. Slight titubation.  Neck ROM : rotation, tilt and flexion extension were normal for age and shoulder shrug was symmetrical.    Motor exam:  Symmetric bulk, tone and ROM.   Normal tone without cog wheeling, symmetric grip strength . Sensory:  Fine touch, pinprick and vibration were tested  and  normal.  Proprioception tested in the upper extremities was normal. Coordination: Rapid alternating movements in the fingers/hands were of normal speed.  The Finger-to-nose maneuver was intact without evidence of ataxia, dysmetria or tremor. Gait and station: Patient could rise unassisted from a seated position, walked without assistive device.  Stance is of normal width/ base and the patient turned with 3 steps.  Toe and heel walk were deferred.  Deep tendon reflexes: in the upper and lower extremities are symmetric and intact.  Babinski response was deferred.         After spending a total time of  45  minutes face to face and additional time for physical and neurologic examination, review of laboratory studies,  personal review of imaging studies, reports and results of other testing and review of referral information / records as far as provided in visit, I have established the following assessments:  1) patient is not excessively daytime sleepy. She is not a chronically insomniac.  2)She snores, has retrognathia and a high grade Mallampati.  3) insomnia not more often than 2 times a week, when she sleeps she sleeps for 7-8 hours. and Read Drivers will help then. Its strictly sleep initiation insomnia, not trouble to maintain sleep.    My Plan is to  proceed with:  1) HST only, no need for attended sleep study.    I would like to thank Plotnikov, Evie Lacks, MD and Plotnikov, Evie Lacks, [place] [address],  [place] [address] for allowing me to meet with and to take care of this pleasant patient.   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   CC: I will share my notes with Dr Alain Marion.  Electronically signed by: Larey Seat, MD 06/05/2021 3:32 PM  [facility] Sleep Board certified by The [employer] of Sleep Medicine and Diplomate of the [employer] of Sleep Medicine. Board certified In Neurology through the [place], Fellow of the [employer] of Neurology. Medical Director of [employer].

## 2021-06-05 NOTE — Addendum Note (Signed)
Addended by: Larey Seat on: 06/05/2021 04:19 PM   Modules accepted: Orders

## 2021-06-05 NOTE — Patient Instructions (Signed)

## 2021-06-13 ENCOUNTER — Ambulatory Visit: Payer: PPO | Admitting: Gastroenterology

## 2021-07-04 DIAGNOSIS — Z83511 Family history of glaucoma: Secondary | ICD-10-CM | POA: Diagnosis not present

## 2021-07-04 DIAGNOSIS — H401211 Low-tension glaucoma, right eye, mild stage: Secondary | ICD-10-CM | POA: Diagnosis not present

## 2021-07-04 DIAGNOSIS — H401222 Low-tension glaucoma, left eye, moderate stage: Secondary | ICD-10-CM | POA: Diagnosis not present

## 2021-07-09 ENCOUNTER — Encounter: Payer: Self-pay | Admitting: Internal Medicine

## 2021-07-09 ENCOUNTER — Ambulatory Visit: Payer: PPO | Admitting: Internal Medicine

## 2021-07-11 ENCOUNTER — Other Ambulatory Visit: Payer: Self-pay | Admitting: Internal Medicine

## 2021-07-11 ENCOUNTER — Ambulatory Visit: Payer: PPO | Admitting: Gastroenterology

## 2021-07-11 MED ORDER — PROMETHAZINE-DM 6.25-15 MG/5ML PO SYRP: 5.0000 mL | ORAL_SOLUTION | Freq: Four times a day (QID) | ORAL | 0 refills | Status: DC | PRN

## 2021-07-15 DIAGNOSIS — M79641 Pain in right hand: Secondary | ICD-10-CM | POA: Diagnosis not present

## 2021-08-07 DIAGNOSIS — M79641 Pain in right hand: Secondary | ICD-10-CM | POA: Diagnosis not present

## 2021-08-08 ENCOUNTER — Ambulatory Visit: Payer: PPO | Admitting: Gastroenterology

## 2021-08-08 ENCOUNTER — Encounter: Payer: Self-pay | Admitting: Gastroenterology

## 2021-08-08 VITALS — BP 118/74 | HR 65 | Ht 65.0 in | Wt 152.6 lb

## 2021-08-08 DIAGNOSIS — R150 Incomplete defecation: Secondary | ICD-10-CM

## 2021-08-08 DIAGNOSIS — R1314 Dysphagia, pharyngoesophageal phase: Secondary | ICD-10-CM | POA: Diagnosis not present

## 2021-08-08 DIAGNOSIS — R194 Change in bowel habit: Secondary | ICD-10-CM

## 2021-08-08 NOTE — Progress Notes (Signed)
? ? ? ?[facility] Progress Note ? ?Chief Complaint: Altered bowel habits, fecal incontinence, dysphagia ? ?Subjective  ?History: ?I last saw Kristin Torres in March 2022 for change in bowel habits with passage of small pellet-like stools.  She had undergone colonoscopy with me in August 2017 for history of colon polyps.  Recommended stool softener, MiraLAX, elevating feet during BMs and follow-up as needed. ? ?Kristin Torres has similar problems as before with passage of small pellet-like stools 1-2 times a day.  She drinks little in the way of fluids during the day and does not like to drink water.  She will have a cup of decaf coffee in the morning and a cup of tea in the afternoon and with supper.  There have been a few occasions where she passed gas and later discovered there was was a smear of fecal material in the underpants.  She denies rectal bleeding. ?Also, she has had episodes of pills or liquid feeling stuck in the neck when swallowing and she needs to bring them back up. ? ?ROS: ?Cardiovascular:  no chest pain ?Respiratory: no dyspnea ? ?The patient's Past Medical, Family and Social History were reviewed and are on file in the EMR. ? ?Objective: ? ?Med list reviewed ? ?Current Outpatient Medications:  ?  aspirin (ASPIRIN CHILDRENS) 81 MG chewable tablet, Chew 1 tablet (81 mg total) by mouth daily., Disp: 100 tablet, Rfl: 11 ?  CALCIUM-MAGNESIUM-ZINC PO, Take 1 tablet by mouth daily., Disp: , Rfl:  ?  Cholecalciferol (VITAMIN D3) 50 MCG (2000 UT) capsule, Take 1 capsule (2,000 Units total) by mouth daily., Disp: 100 capsule, Rfl: 3 ?  clobetasol ointment (TEMOVATE) 8.56 %, Apply 1 application topically 2 (two) times daily., Disp: 30 g, Rfl: 0 ?  diltiazem (CARDIZEM CD) 180 MG 24 hr capsule, TAKE 1 CAPSULE BY MOUTH EVERY DAY, Disp: 90 capsule, Rfl: 2 ?  dorzolamide-timolol (COSOPT) 22.3-6.8 MG/ML ophthalmic solution, Place 1 drop into both eyes 2 (two) times daily., Disp: , Rfl:  ?  escitalopram (LEXAPRO) 5 MG tablet,  Take 1 tablet (5 mg total) by mouth at bedtime., Disp: 30 tablet, Rfl: 5 ?  estradiol (ESTRACE) 0.1 MG/GM vaginal cream, Place vaginally., Disp: , Rfl:  ?  fexofenadine (ALLEGRA) 180 MG tablet, Take 180 mg by mouth daily as needed. allergies, Disp: , Rfl:  ?  latanoprost (XALATAN) 0.005 % ophthalmic solution, , Disp: , Rfl:  ?  losartan-hydrochlorothiazide (HYZAAR) 50-12.5 MG tablet, TAKE 1 TABLET BY MOUTH EVERY DAY, Disp: 90 tablet, Rfl: 3 ?  meclizine (ANTIVERT) 12.5 MG tablet, TAKE 1 TABLET BY MOUTH 3 TIMES DAILY AS NEEDED FOR DIZZINESS., Disp: 60 tablet, Rfl: 0 ?  promethazine-dextromethorphan (PROMETHAZINE-DM) 6.25-15 MG/5ML syrup, Take 5 mLs by mouth 4 (four) times daily as needed for cough., Disp: 240 mL, Rfl: 0 ?  zaleplon (SONATA) 5 MG capsule, Take 1 capsule (5 mg total) by mouth at bedtime as needed for sleep., Disp: 30 capsule, Rfl: 0 ? ? ?Vital signs in last 24 hrs: ?Vitals:  ? 08/08/21 1450  ?BP: 118/74  ?Pulse: 65  ?SpO2: 98%  ? ?Wt Readings from Last 3 Encounters:  ?08/08/21 152 lb 9.6 oz (69.2 kg)  ?06/05/21 151 lb 8 oz (68.7 kg)  ?04/08/21 151 lb 12.8 oz (68.9 kg)  ?  ?Physical Exam ? ?Well-appearing, normal vocal quality. ?HEENT: sclera anicteric, oral mucosa moist without lesions ?Neck: supple, no thyromegaly, JVD or lymphadenopathy ?Cardiac: RRR without murmurs, S1S2 heard, no peripheral edema ?Pulm: clear to auscultation bilaterally,  normal RR and effort noted ?Abdomen: soft, no tenderness, with active bowel sounds. No guarding or palpable hepatosplenomegaly. ?Rectal: Mildly decreased resting sphincter tone.  Firm stool in rectal vault, no tenderness or fissure ? ?Labs: ? ? ?___________________________________________ ?Radiologic studies: ? ? ?____________________________________________ ?Other: ? ? ?_____________________________________________ ?Assessment & Plan  ?Assessment: ?Encounter Diagnoses  ?Name Primary?  ? Change in bowel habits Yes  ? Incomplete defecation   ? Pharyngoesophageal  dysphagia   ? ? ?Chronic constipation leading to episodes of small-volume incontinence.  She is getting incompletely evacuated, possibly decreased motility and/or pelvic floor dysfunction. ?She would like to try natural remedies, so I recommended magnesium capsule every day.  She also needs to drink more water and it sounds like she gets enough dietary fiber. ?If that does not help, then I recommend low-dose Linzess.  I offered her some samples today, but she would prefer to try the other route and let me know if she feels some other medicine is needed. ? 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  I recommended taking her medicines with the [place] yogurt that she has every day and a chin tuck maneuver for swallowing liquids.  If problems persist despite that, then a barium study would be the next step.  Upper endoscopy often unrevealing for those symptoms. ? ? ? ?23 minutes were spent on this encounter (including chart review, history/exam, counseling/coordination of care, and documentation) > 50% of that time was spent on counseling and coordination of care.  ? ?Nelida Meuse III ? ?

## 2021-08-08 NOTE — Patient Instructions (Signed)
If you are age 69 or older, your body mass index should be between 23-30. Your Body mass index is 25.39 kg/m?Marland Kitchen If this is out of the aforementioned range listed, please consider follow up with your Primary Care Provider. ? ?If you are age 17 or younger, your body mass index should be between 19-25. Your Body mass index is 25.39 kg/m?Marland Kitchen If this is out of the aformentioned range listed, please consider follow up with your Primary Care Provider.  ? ?________________________________________________________ ? ?The [facility] providers would like to encourage you to use [facility] to communicate with providers for non-urgent requests or questions.  Due to long hold times on the telephone, sending your provider a message by [facility] may be a faster and more efficient way to get a response.  Please allow 48 business hours for a response.  Please remember that this is for non-urgent requests.  ?_______________________________________________________ ? ?It was a pleasure to see you today! ? ?Thank you for trusting me with your gastrointestinal care!   ? ? ?

## 2021-09-01 ENCOUNTER — Ambulatory Visit (INDEPENDENT_AMBULATORY_CARE_PROVIDER_SITE_OTHER): Payer: PPO | Admitting: Neurology

## 2021-09-01 DIAGNOSIS — R0683 Snoring: Secondary | ICD-10-CM

## 2021-09-01 DIAGNOSIS — N951 Menopausal and female climacteric states: Secondary | ICD-10-CM

## 2021-09-01 DIAGNOSIS — G4733 Obstructive sleep apnea (adult) (pediatric): Secondary | ICD-10-CM

## 2021-09-02 ENCOUNTER — Telehealth: Payer: Self-pay | Admitting: Neurology

## 2021-09-02 NOTE — Telephone Encounter (Signed)
Patient stated she dropped the home sleep study device off but she is wanting to know how long it will take for her to get results back because she will be out of the country starting on Saturday 09/06/2021 for several weeks.  ?

## 2021-09-02 NOTE — Telephone Encounter (Signed)
Called the patient and advised the typical process but then advised that this particular time frame is different as the doctor also has a upcoming trip and will be out of the office for extended amount of time. 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 Advised if she gets it done tomorrow than we will reach out to her tomorrow or Thursday which will be before she leaves on her vacation out of town otherwise It will be once Dr Brett Fairy gets back from her vacation likely. Pt verbalized understanding. ? ?

## 2021-09-03 DIAGNOSIS — G4733 Obstructive sleep apnea (adult) (pediatric): Secondary | ICD-10-CM | POA: Insufficient documentation

## 2021-09-03 NOTE — Procedures (Signed)
? ?  ?  ?[facility] ?  ?HOME SLEEP TEST REPORT ( by Watch PAT)   ?STUDY DATE:  09-02-2021 ?  ?ORDERING CLINICIAN: Larey Seat, MD  ?REFERRING CLINICIAN: Dr Plotnikov ?  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   ?Chief concern according to patient :  2 times a week, I have trouble to sleep and will take sonata. It works.  ?The patient was also on TEMAZEPAM. She is not taking it now. She has a past medical history of Cervicalgia, Chronic back pain, Dermatophytosis of nail, GERD (gastroesophageal reflux disease), History of pneumonia, Hypertension, Leg cramps, MI (myocardial infarction) ([place]),  Personal history of other diseases of circulatory system, PSVT (paroxysmal supraventricular tachycardia) ([place]), Sinusitis,Nocturia, and Tubular adenoma of colon. She contracted COVID in August 2022- during a [place] trip. Atrial fibrillation, ablation in 2007- after she had an event in [place].  ? ?  ?Epworth sleepiness score: 2/24. ?  ?BMI: 25.3 kg/m? ?  ?Neck Circumference: 14" ?  ?FINDINGS: ?  ?Sleep Summary: ?  ?Total Recording Time (hours, min): 9 hours and 55 minutes of which 9 hours and 15 minutes was a total recorded sleep time with a proportion of 26.4% REM sleep.     ?                            ?  ?Respiratory Indices: ?  ?Calculated pAHI (per hour):    The overall apnea hypopnea index was 23.1/h and indicates a moderate severity of sleep apnea.                       ?  ?REM pAHI:   18.1/h                                            ?  ?NREM pAHI: 24.9/h                          ?  ?Positional the most severe apnea by sleep position was seen once the patient slept in the right lateral position, with an AHI: 60.3/h.  The patient only spent 20 minutes in the sleep  position.  She spent 180 minutes in prone sleep with an AHI of only 5/h, 352 minutes in supine sleep with an AHI of 29.9/h. ? ?Snoring was moderate the mean volume was 41 dB snoring spiked briefly to a volume of over 60 dB.   ?Snoring was present for 42% of the recorded time.                                                ?  ?Oxygen Saturation Statistics:    ?  ?O2 Saturation Range (%): Between a nadir of 82 and a maximum of 100% with a mean saturation of 95%.                                    ?  ?  O2 Saturation (minutes) <89%: 0.2 minutes         ?  ?Pulse Rate Statistics: ?  ?Pulse Mean (bpm): 65 bpm              ?  ?Pulse Range: Between 50 and 89 bpm.  A cardiac rhythm is not recorded by this device, only the heart rate can be evaluated.             ?  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 ?  ?RECOMMENDATION: I would recommend positive airway pressure therapy as my first choice.  The benefit lies and snoring and apnea being treated and therapeutic data can easily be followed.  Given her history of paroxysmal supraventricular tachycardia and a higher risk of developing atrial fibrillation I would strongly prefer this treatment. ? ?If CPAP would not be tolerated a dental device or an inspire device could be considered. ? ?A full facemask should be avoided because of retrognathia if possible. ?Autotitration between 6 and 16 cm water pressure with 2 cm expiratory pressure relief, heated humidification and a mask of patient's choice and comfort to be fitted in reclined position.  Consider a chinstrap if the nasal pillow is comfortable but leaves too much oral air leak. ? ?  ?INTERPRETING PHYSICIAN: ? ? Larey Seat, MD  ? ?Medical Director of [facility] [employer].   ? ? ? ? ? ? ? ? ? ? ? ? ? ? ? ? ? ?

## 2021-09-03 NOTE — Addendum Note (Signed)
Addended by: Larey Seat on: 09/03/2021 05:32 PM ? ? Modules accepted: Orders ? ?

## 2021-09-03 NOTE — Progress Notes (Signed)
? ? ?  ?  ?[facility] ?  ?HOME SLEEP TEST REPORT ( by [facility])   ?STUDY DATE:  09-02-2021 ?  ?ORDERING CLINICIAN: Larey Seat, MD  ?REFERRING CLINICIAN: Dr Plotnikov ?  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   ?Chief concern according to patient :  2 times a week, I have trouble to sleep and will take sonata. It works.  ?The patient was also on TEMAZEPAM. She is not taking it now. She has a past medical history of Cervicalgia, Chronic back pain, Dermatophytosis of nail, GERD (gastroesophageal reflux disease), History of pneumonia, Hypertension, Leg cramps, MI (myocardial infarction) ([place]),  Personal history of other diseases of circulatory system, PSVT (paroxysmal supraventricular tachycardia) ([place]), Sinusitis,Nocturia, and Tubular adenoma of colon. She contracted COVID in August 2022- during a [place] trip. Atrial fibrillation, ablation in 2007- after she had an event in [place].  ? ?  ?Epworth sleepiness score: 2/24. ?  ?BMI: 25.3 kg/m? ?  ?Neck Circumference: 14" ?  ?FINDINGS: ?  ?Sleep Summary: ?  ?Total Recording Time (hours, min): 9 hours and 55 minutes of which 9 hours and 15 minutes was a total recorded sleep time with a proportion of 26.4% REM sleep.     ?                            ?  ?Respiratory Indices: ?  ?Calculated pAHI (per hour):    The overall apnea hypopnea index was 23.1/h and indicates a moderate severity of sleep apnea.                       ?  ?REM pAHI:   18.1/h                                            ?  ?NREM pAHI: 24.9/h                          ?  ?Positional the most severe apnea by sleep position was seen once the patient slept in the right lateral position, with an AHI: 60.3/h.  The patient only spent 20 minutes in the sleep  position.  She spent 180 minutes in prone sleep with an AHI of only 5/h, 352 minutes in supine sleep with an AHI of 29.9/h. ? ?Snoring was moderate the mean volume was 41 dB snoring spiked briefly to a volume of over 60 dB.   ?Snoring was present for 42% of the recorded time.                                                ?  ?Oxygen Saturation Statistics:    ?  ?O2 Saturation Range (%): Between a nadir of 82 and a maximum of 100% with a mean saturation of 95%.                                    ?  ?  O2 Saturation (minutes) <89%: 0.2 minutes         ?  ?Pulse Rate Statistics: ?  ?Pulse Mean (bpm): 65 bpm              ?  ?Pulse Range: Between 50 and 89 bpm.  A cardiac rhythm is not recorded by this device, only the heart rate can be evaluated.             ?  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 ?  ?RECOMMENDATION: I would recommend positive airway pressure therapy as my first choice.  The benefit lies and snoring and apnea being treated and therapeutic data can easily be followed.  Given her history of paroxysmal supraventricular tachycardia and a higher risk of developing atrial fibrillation I would strongly prefer this treatment. ? ?If CPAP would not be tolerated a dental device or an inspire device could be considered. ? ?A full facemask should be avoided because of retrognathia if possible. ?Autotitration between 6 and 16 cm water pressure with 2 cm expiratory pressure relief, heated humidification and a mask of patient's choice and comfort to be fitted in reclined position.  Consider a chinstrap if the nasal pillow is comfortable but leaves too much oral air leak. ? ?  ?INTERPRETING PHYSICIAN: ? ? Larey Seat, MD  ? ?Medical Director of [facility] [employer].   ? ? ? ? ? ? ? ? ? ? ? ? ? ? ? ? ? ? ? ? ? ?

## 2021-09-04 ENCOUNTER — Encounter: Payer: Self-pay | Admitting: *Deleted

## 2021-09-04 ENCOUNTER — Telehealth: Payer: Self-pay | Admitting: *Deleted

## 2021-09-04 NOTE — Telephone Encounter (Signed)
-----   Message from Larey Seat, MD sent at 09/03/2021  5:32 PM EDT ----- IMPRESSION:  This HST confirms the presence of moderate severe obstructive sleep apnea, not REM sleep dependent but positional dependent and not associated with hypoxia.  RECOMMENDATION: I would recommend positive airway pressure therapy as my first choice.  The benefit lies and snoring and apnea being treated and therapeutic data can easily be followed.  Given her history of paroxysmal supraventricular tachycardia and a higher risk of developing atrial fibrillation I would strongly prefer this treatment.  If CPAP would not be tolerated a dental device or an inspire device could be considered.  A full facemask should be avoided because of retrognathia -if possible. Autotitration between 6 and 16 cm water pressure with 2 cm expiratory pressure relief, heated humidification and a mask of patient's choice and comfort to be fitted in reclined position.  Consider a chinstrap if the nasal pillow is comfortable but leaves too much oral air leak.   INTERPRETING PHYSICIAN:   Larey Seat, MD

## 2021-09-04 NOTE — Telephone Encounter (Addendum)
I called pt. I advised pt that Dr. Brett Fairy reviewed their sleep study results and found that pt has sleep apnea. Dr. Brett Fairy recommends that pt start CPAP. I reviewed PAP compliance expectations with the pt. Pt is agreeable to starting a CPAP. I advised pt that an order will be sent to a DME, [facility], and [facility] will call the pt within about one week after they file with the pt's insurance. [facility] will show the pt how to use the machine, fit for masks, and troubleshoot the CPAP if needed. A follow up appt was made for insurance purposes with Dr. Brett Fairy on 12/02/21 at 2:30pm. Pt verbalized understanding to arrive 15 minutes early and bring their CPAP. A letter with all of this information in it will be mailed to the pt as a reminder. I verified with the pt that the address we have on file is correct. Pt verbalized understanding of results. Pt had no questions at this time but was encouraged to call back if questions arise. I have sent the order to [facility] and have received confirmation that they have received the order.  [place] going out of country 09/06[place]21 and will be back 09/29/21. I placed this on order.

## 2021-09-04 NOTE — Telephone Encounter (Signed)
Called pt. She was at the gym. She will call back to go over results later on today.

## 2021-10-09 ENCOUNTER — Ambulatory Visit: Payer: PPO | Admitting: Internal Medicine

## 2021-10-13 ENCOUNTER — Ambulatory Visit: Payer: PPO | Admitting: Internal Medicine

## 2021-10-16 ENCOUNTER — Encounter: Payer: Self-pay | Admitting: Internal Medicine

## 2021-10-16 ENCOUNTER — Ambulatory Visit (INDEPENDENT_AMBULATORY_CARE_PROVIDER_SITE_OTHER): Payer: PPO | Admitting: Internal Medicine

## 2021-10-16 VITALS — BP 118/80 | HR 66 | Temp 97.8°F | Ht 65.0 in | Wt 153.0 lb

## 2021-10-16 DIAGNOSIS — K5900 Constipation, unspecified: Secondary | ICD-10-CM | POA: Insufficient documentation

## 2021-10-16 DIAGNOSIS — L739 Follicular disorder, unspecified: Secondary | ICD-10-CM | POA: Insufficient documentation

## 2021-10-16 DIAGNOSIS — F418 Other specified anxiety disorders: Secondary | ICD-10-CM | POA: Diagnosis not present

## 2021-10-16 DIAGNOSIS — I10 Essential (primary) hypertension: Secondary | ICD-10-CM | POA: Diagnosis not present

## 2021-10-16 DIAGNOSIS — R002 Palpitations: Secondary | ICD-10-CM

## 2021-10-16 DIAGNOSIS — E559 Vitamin D deficiency, unspecified: Secondary | ICD-10-CM | POA: Insufficient documentation

## 2021-10-16 DIAGNOSIS — R61 Generalized hyperhidrosis: Secondary | ICD-10-CM | POA: Insufficient documentation

## 2021-10-16 DIAGNOSIS — I471 Supraventricular tachycardia: Secondary | ICD-10-CM | POA: Diagnosis not present

## 2021-10-16 DIAGNOSIS — K5904 Chronic idiopathic constipation: Secondary | ICD-10-CM

## 2021-10-16 DIAGNOSIS — R5383 Other fatigue: Secondary | ICD-10-CM | POA: Diagnosis not present

## 2021-10-16 MED ORDER — DOCUSATE SODIUM 100 MG PO CAPS: 100.0000 mg | ORAL_CAPSULE | Freq: Two times a day (BID) | ORAL | 0 refills | Status: DC

## 2021-10-16 MED ORDER — ASPIRIN 81 MG PO TBEC: 81.0000 mg | DELAYED_RELEASE_TABLET | Freq: Every day | ORAL | 3 refills | Status: AC

## 2021-10-16 NOTE — Progress Notes (Signed)
Subjective:  Patient ID: Kristin Torres, female    DOB: 1950/07/09  Age: 71 y.o. MRN: [id number]  CC: No chief complaint on file.   HPI Makaelyn Aponte presents for HTN, palpitations, anxiety  Outpatient Medications Prior to Visit  Medication Sig Dispense Refill   CALCIUM-MAGNESIUM-ZINC PO Take 1 tablet by mouth daily.     Cholecalciferol (VITAMIN D3) 50 MCG (2000 UT) capsule Take 1 capsule (2,000 Units total) by mouth daily. 100 capsule 3   clobetasol ointment (TEMOVATE) 4.09 % Apply 1 application topically 2 (two) times daily. 30 g 0   diltiazem (CARDIZEM CD) 180 MG 24 hr capsule TAKE 1 CAPSULE BY MOUTH EVERY DAY 90 capsule 2   dorzolamide-timolol (COSOPT) 22.3-6.8 MG/ML ophthalmic solution Place 1 drop into both eyes 2 (two) times daily.     escitalopram (LEXAPRO) 5 MG tablet Take 1 tablet (5 mg total) by mouth at bedtime. 30 tablet 5   estradiol (ESTRACE) 0.1 MG/GM vaginal cream Place vaginally.     fexofenadine (ALLEGRA) 180 MG tablet Take 180 mg by mouth daily as needed. allergies     latanoprost (XALATAN) 0.005 % ophthalmic solution      losartan-hydrochlorothiazide (HYZAAR) 50-12.5 MG tablet TAKE 1 TABLET BY MOUTH EVERY DAY 90 tablet 3   meclizine (ANTIVERT) 12.5 MG tablet TAKE 1 TABLET BY MOUTH 3 TIMES DAILY AS NEEDED FOR DIZZINESS. 60 tablet 0   promethazine-dextromethorphan (PROMETHAZINE-DM) 6.25-15 MG/5ML syrup Take 5 mLs by mouth 4 (four) times daily as needed for cough. 240 mL 0   zaleplon (SONATA) 5 MG capsule Take 1 capsule (5 mg total) by mouth at bedtime as needed for sleep. 30 capsule 0   aspirin (ASPIRIN CHILDRENS) 81 MG chewable tablet Chew 1 tablet (81 mg total) by mouth daily. 100 tablet 11   No facility-administered medications prior to visit.    ROS: Review of Systems  Constitutional:  Positive for fatigue. Negative for activity change, appetite change, chills and unexpected weight change.  HENT:  Negative for congestion, mouth sores and sinus pressure.    Eyes:  Negative for visual disturbance.  Respiratory:  Negative for cough, chest tightness and wheezing.   Cardiovascular:  Positive for palpitations.  Gastrointestinal:  Negative for abdominal pain and nausea.  Genitourinary:  Negative for difficulty urinating, frequency and vaginal pain.  Musculoskeletal:  Negative for back pain and gait problem.  Skin:  Negative for pallor and rash.  Neurological:  Negative for dizziness, tremors, weakness, numbness and headaches.  Psychiatric/Behavioral:  Negative for confusion and sleep disturbance. The patient is nervous/anxious.     Objective:  BP 118/80 (BP Location: Left Arm, Patient Position: Sitting, Cuff Size: Normal)   Pulse 66   Temp 97.8 F (36.6 C) (Oral)   Ht '5\' 5"'$  (1.651 m)   Wt 153 lb (69.4 kg)   SpO2 95%   BMI 25.46 kg/m   BP Readings from Last 3 Encounters:  10/16/21 118/80  08/08/21 118/74  06/05/21 104/61    Wt Readings from Last 3 Encounters:  10/16/21 153 lb (69.4 kg)  08/08/21 152 lb 9.6 oz (69.2 kg)  06/05/21 151 lb 8 oz (68.7 kg)    Physical Exam Constitutional:      General: She is not in acute distress.    Appearance: Normal appearance. She is well-developed.  HENT:     Head: Normocephalic.     Right Ear: External ear normal.     Left Ear: External ear normal.     Nose: Nose normal.  Eyes:     General:        Right eye: No discharge.        Left eye: No discharge.     Conjunctiva/sclera: Conjunctivae normal.     Pupils: Pupils are equal, round, and reactive to light.  Neck:     Thyroid: No thyromegaly.     Vascular: No JVD.     Trachea: No tracheal deviation.  Cardiovascular:     Rate and Rhythm: Normal rate and regular rhythm.     Heart sounds: Normal heart sounds.  Pulmonary:     Effort: No respiratory distress.     Breath sounds: No stridor. No wheezing.  Abdominal:     General: Bowel sounds are normal. There is no distension.     Palpations: Abdomen is soft. There is no mass.      Tenderness: There is no abdominal tenderness. There is no guarding or rebound.  Musculoskeletal:        General: No tenderness.     Cervical back: Normal range of motion and neck supple. No rigidity.  Lymphadenopathy:     Cervical: No cervical adenopathy.  Skin:    Findings: No erythema or rash.  Neurological:     Cranial Nerves: No cranial nerve deficit.     Motor: No abnormal muscle tone.     Coordination: Coordination normal.     Deep Tendon Reflexes: Reflexes normal.  Psychiatric:        Behavior: Behavior normal.        Thought Content: Thought content normal.        Judgment: Judgment normal.     Lab Results  Component Value Date   WBC 5.3 10/17/2021   HGB 12.9 10/17/2021   HCT 38.6 10/17/2021   PLT 177.0 10/17/2021   GLUCOSE 100 (H) 10/17/2021   CHOL 215 (H) 10/17/2021   TRIG 47.0 10/17/2021   HDL 67.90 10/17/2021   LDLCALC 138 (H) 10/17/2021   ALT 47 (H) 10/17/2021   AST 44 (H) 10/17/2021   NA 138 10/17/2021   K 4.1 10/17/2021   CL 102 10/17/2021   CREATININE 0.90 10/17/2021   BUN 23 10/17/2021   CO2 29 10/17/2021   TSH 1.81 10/17/2021   HGBA1C 5.5 09/27/2014    US Abdomen Complete  Result Date: 10/18/2020 CLINICAL DATA:  Postprandial left upper quadrant abdominal pain. EXAM: ABDOMEN ULTRASOUND COMPLETE COMPARISON:  Ultrasound February 27, 2014 FINDINGS: Gallbladder: No gallstones or wall thickening visualized. No sonographic Murphy sign noted by sonographer. Common bile duct: Diameter: 3 mm Liver: No focal lesion identified. The previously visualized probable hemangioma in the right lobe of the liver is not seen on today's examination. Within normal limits in parenchymal echogenicity. Portal vein is patent on color Doppler imaging with normal direction of blood flow towards the liver. IVC: No abnormality visualized. Pancreas: Visualized portion unremarkable. Spleen: Size and appearance within normal limits. Right Kidney: Length: 10.4 cm. Echogenicity within normal  limits. No mass or hydronephrosis visualized. Left Kidney: Length: 10.5 cm. Echogenicity within normal limits. No mass or hydronephrosis visualized. Abdominal aorta: No aneurysm visualized. Other findings: None. IMPRESSION: Unremarkable abdominal ultrasound. Specifically no sonographic finding to explain patient's left upper quadrant pain. Electronically Signed   By: Dahlia Bailiff MD   On: 10/18/2020 09:22    Assessment & Plan:   Problem List Items Addressed This Visit     Constipation    Try Colace 100 mg bid      Depression with anxiety  Lexapro.  Lorazepam prn.  Potential benefits of a long term benzodiazepines  use as well as potential risks  and complications were explained to the patient and were aknowledged.      Fatigue - Primary   Relevant Orders   TSH (Completed)   Urinalysis (Completed)   CBC with Differential/Platelet (Completed)   Lipid panel (Completed)   Comprehensive metabolic panel (Completed)   HTN (hypertension)    Cont on Hyzaar, Diltiazem      Relevant Medications   aspirin EC 81 MG tablet   Palpitations    Continue on diltiazem.  Status post ablation.      SVT (supraventricular tachycardia) (HCC)    Status post cardioversion.  Continue on diltiazem      Relevant Medications   aspirin EC 81 MG tablet   Other Relevant Orders   TSH (Completed)   Urinalysis (Completed)   CBC with Differential/Platelet (Completed)   Lipid panel (Completed)   Comprehensive metabolic panel (Completed)      Meds ordered this encounter  Medications   docusate sodium (COLACE) 100 MG capsule    Sig: Take 1 capsule (100 mg total) by mouth 2 (two) times daily.    Dispense:  10 capsule    Refill:  0   aspirin EC 81 MG tablet    Sig: Take 1 tablet (81 mg total) by mouth daily.    Dispense:  100 tablet    Refill:  3      Follow-up: Return in about 6 months (around 04/17/2022) for a follow-up visit.  Walker Kehr, MD

## 2021-10-16 NOTE — Patient Instructions (Signed)
Arnica cream for bruises 

## 2021-10-16 NOTE — Assessment & Plan Note (Signed)
Try Colace 100 mg bid

## 2021-10-17 ENCOUNTER — Other Ambulatory Visit (INDEPENDENT_AMBULATORY_CARE_PROVIDER_SITE_OTHER): Payer: PPO

## 2021-10-17 DIAGNOSIS — R5383 Other fatigue: Secondary | ICD-10-CM | POA: Diagnosis not present

## 2021-10-17 DIAGNOSIS — Z1231 Encounter for screening mammogram for malignant neoplasm of breast: Secondary | ICD-10-CM | POA: Diagnosis not present

## 2021-10-17 DIAGNOSIS — I471 Supraventricular tachycardia: Secondary | ICD-10-CM

## 2021-10-17 LAB — HM MAMMOGRAPHY

## 2021-10-17 LAB — URINALYSIS
Bilirubin Urine: NEGATIVE
Hgb urine dipstick: NEGATIVE
Ketones, ur: NEGATIVE
Leukocytes,Ua: NEGATIVE
Nitrite: NEGATIVE
Specific Gravity, Urine: 1.01 (ref 1.000–1.030)
Total Protein, Urine: NEGATIVE
Urine Glucose: NEGATIVE
Urobilinogen, UA: 0.2 (ref 0.0–1.0)
pH: 7 (ref 5.0–8.0)

## 2021-10-17 LAB — CBC WITH DIFFERENTIAL/PLATELET
Basophils Absolute: 0 K/uL (ref 0.0–0.1)
Basophils Relative: 0.7 % (ref 0.0–3.0)
Eosinophils Absolute: 0.2 K/uL (ref 0.0–0.7)
Eosinophils Relative: 4.4 % (ref 0.0–5.0)
HCT: 38.6 % (ref 36.0–46.0)
Hemoglobin: 12.9 g/dL (ref 12.0–15.0)
Lymphocytes Relative: 42.3 % (ref 12.0–46.0)
Lymphs Abs: 2.2 K/uL (ref 0.7–4.0)
MCHC: 33.5 g/dL (ref 30.0–36.0)
MCV: 97 fl (ref 78.0–100.0)
Monocytes Absolute: 0.3 K/uL (ref 0.1–1.0)
Monocytes Relative: 6.2 % (ref 3.0–12.0)
Neutro Abs: 2.5 K/uL (ref 1.4–7.7)
Neutrophils Relative %: 46.4 % (ref 43.0–77.0)
Platelets: 177 K/uL (ref 150.0–400.0)
RBC: 3.98 Mil/uL (ref 3.87–5.11)
RDW: 12.4 % (ref 11.5–15.5)
WBC: 5.3 K/uL (ref 4.0–10.5)

## 2021-10-17 LAB — COMPREHENSIVE METABOLIC PANEL WITH GFR
ALT: 47 U/L — ABNORMAL HIGH (ref 0–35)
AST: 44 U/L — ABNORMAL HIGH (ref 0–37)
Albumin: 4.4 g/dL (ref 3.5–5.2)
Alkaline Phosphatase: 85 U/L (ref 39–117)
BUN: 23 mg/dL (ref 6–23)
CO2: 29 meq/L (ref 19–32)
Calcium: 9.8 mg/dL (ref 8.4–10.5)
Chloride: 102 meq/L (ref 96–112)
Creatinine, Ser: 0.9 mg/dL (ref 0.40–1.20)
GFR: 65.51 mL/min (ref >60.00)
Glucose, Bld: 100 mg/dL — ABNORMAL HIGH (ref 70–99)
Potassium: 4.1 meq/L (ref 3.5–5.1)
Sodium: 138 meq/L (ref 135–145)
Total Bilirubin: 0.4 mg/dL (ref 0.2–1.2)
Total Protein: 7.4 g/dL (ref 6.0–8.3)

## 2021-10-17 LAB — LIPID PANEL
Cholesterol: 215 mg/dL — ABNORMAL HIGH (ref 0–200)
HDL: 67.9 mg/dL (ref >39.00)
LDL Cholesterol: 138 mg/dL — ABNORMAL HIGH (ref 0–99)
NonHDL: 147.48
Total CHOL/HDL Ratio: 3
Triglycerides: 47 mg/dL (ref 0.0–149.0)
VLDL: 9.4 mg/dL (ref 0.0–40.0)

## 2021-10-17 LAB — TSH: TSH: 1.81 u[IU]/mL (ref 0.35–5.50)

## 2021-10-20 ENCOUNTER — Encounter: Payer: Self-pay | Admitting: Internal Medicine

## 2021-10-20 ENCOUNTER — Other Ambulatory Visit: Payer: Self-pay | Admitting: Internal Medicine

## 2021-10-20 DIAGNOSIS — R7989 Other specified abnormal findings of blood chemistry: Secondary | ICD-10-CM

## 2021-10-26 NOTE — Assessment & Plan Note (Signed)
Continue on diltiazem.  Status post ablation.

## 2021-10-26 NOTE — Assessment & Plan Note (Signed)
Cont on Hyzaar, Diltiazem

## 2021-10-26 NOTE — Assessment & Plan Note (Signed)
Status post cardioversion.  Continue on diltiazem

## 2021-10-26 NOTE — Assessment & Plan Note (Signed)
Lexapro.  Lorazepam prn.  Potential benefits of a long term benzodiazepines  use as well as potential risks  and complications were explained to the patient and were aknowledged.

## 2021-11-07 ENCOUNTER — Other Ambulatory Visit: Payer: Self-pay | Admitting: Internal Medicine

## 2021-11-07 DIAGNOSIS — E785 Hyperlipidemia, unspecified: Secondary | ICD-10-CM

## 2021-11-07 MED ORDER — EPINEPHRINE 0.3 MG/0.3ML IJ SOAJ: 0.3000 mg | INTRAMUSCULAR | 3 refills | Status: AC | PRN

## 2021-11-10 ENCOUNTER — Telehealth: Payer: Self-pay | Admitting: Internal Medicine

## 2021-11-10 NOTE — Telephone Encounter (Signed)
Kristin Torres with [facility] calls today in regards to PT's recent appointment scheduled with them for a Cardiac Scoring. This appointment was cancelled due to PT already having an ablation Kristin Torres stated this would affect imaging).   CB if needed: [phone] Option 1, and then Option 4

## 2021-11-17 ENCOUNTER — Ambulatory Visit: Payer: PPO | Admitting: Dermatology

## 2021-11-17 NOTE — Telephone Encounter (Signed)
Noted. Thanks.

## 2021-11-25 DIAGNOSIS — L233 Allergic contact dermatitis due to drugs in contact with skin: Secondary | ICD-10-CM | POA: Diagnosis not present

## 2021-11-25 DIAGNOSIS — H401222 Low-tension glaucoma, left eye, moderate stage: Secondary | ICD-10-CM | POA: Diagnosis not present

## 2021-11-25 DIAGNOSIS — H10213 Acute toxic conjunctivitis, bilateral: Secondary | ICD-10-CM | POA: Diagnosis not present

## 2021-11-25 DIAGNOSIS — Z83511 Family history of glaucoma: Secondary | ICD-10-CM | POA: Diagnosis not present

## 2021-11-25 DIAGNOSIS — H401211 Low-tension glaucoma, right eye, mild stage: Secondary | ICD-10-CM | POA: Diagnosis not present

## 2021-11-26 ENCOUNTER — Ambulatory Visit: Payer: PPO | Admitting: Dermatology

## 2021-12-01 ENCOUNTER — Encounter: Payer: Self-pay | Admitting: Internal Medicine

## 2021-12-01 ENCOUNTER — Ambulatory Visit (INDEPENDENT_AMBULATORY_CARE_PROVIDER_SITE_OTHER): Payer: PPO | Admitting: Internal Medicine

## 2021-12-01 VITALS — BP 118/80 | HR 66 | Resp 18 | Ht 65.0 in | Wt 152.4 lb

## 2021-12-01 DIAGNOSIS — R350 Frequency of micturition: Secondary | ICD-10-CM

## 2021-12-01 LAB — POCT URINALYSIS DIPSTICK
Bilirubin, UA: NEGATIVE
Glucose, UA: NEGATIVE
Ketones, UA: NEGATIVE
Nitrite, UA: NEGATIVE
Protein, UA: POSITIVE — AB
Spec Grav, UA: 1.015 (ref 1.010–1.025)
Urobilinogen, UA: 1 E.U./dL
pH, UA: 6 (ref 5.0–8.0)

## 2021-12-01 MED ORDER — NITROFURANTOIN MONOHYD MACRO 100 MG PO CAPS: 100.0000 mg | ORAL_CAPSULE | Freq: Two times a day (BID) | ORAL | 0 refills | Status: DC

## 2021-12-01 NOTE — Progress Notes (Unsigned)
   Subjective:   Patient ID: Kristin Torres, female    DOB: May 22, 1950, 71 y.o.   MRN: [id number]  HPI The patient is a 71 YO female coming in for UTI symptoms.  Review of Systems  Constitutional: Negative.   Respiratory: Negative.    Cardiovascular: Negative.   Gastrointestinal:  Negative for abdominal distention, abdominal pain, constipation, diarrhea, nausea and vomiting.  Genitourinary:  Positive for frequency and urgency. Negative for dysuria.  Musculoskeletal: Negative.   Skin: Negative.     Objective:  Physical Exam Constitutional:      Appearance: She is well-developed.  HENT:     Head: Normocephalic and atraumatic.  Cardiovascular:     Rate and Rhythm: Normal rate and regular rhythm.  Pulmonary:     Effort: Pulmonary effort is normal. No respiratory distress.     Breath sounds: Normal breath sounds. No wheezing or rales.  Abdominal:     General: Bowel sounds are normal. There is no distension.     Palpations: Abdomen is soft.     Tenderness: There is no abdominal tenderness. There is no rebound.  Musculoskeletal:     Cervical back: Normal range of motion.  Skin:    General: Skin is warm and dry.  Neurological:     Mental Status: She is alert and oriented to person, place, and time.     Coordination: Coordination normal.     Vitals:   12/01/21 1537  BP: 118/80  Pulse: 66  Resp: 18  SpO2: 97%  Weight: 152 lb 6.4 oz (69.1 kg)  Height: '5\' 5"'$  (1.651 m)    Assessment & Plan:

## 2021-12-01 NOTE — Patient Instructions (Signed)
We have sent in macrobid to take 1 pill twice a day for 5 days. 

## 2021-12-02 ENCOUNTER — Ambulatory Visit: Payer: PPO | Admitting: Neurology

## 2021-12-03 DIAGNOSIS — R350 Frequency of micturition: Secondary | ICD-10-CM | POA: Insufficient documentation

## 2021-12-03 NOTE — Assessment & Plan Note (Signed)
POC U/A consistent with acute cystitis. Rx macrobid 5 day course.

## 2021-12-27 ENCOUNTER — Other Ambulatory Visit: Payer: Self-pay | Admitting: Internal Medicine

## 2021-12-30 DIAGNOSIS — H43811 Vitreous degeneration, right eye: Secondary | ICD-10-CM | POA: Diagnosis not present

## 2021-12-30 DIAGNOSIS — H401222 Low-tension glaucoma, left eye, moderate stage: Secondary | ICD-10-CM | POA: Diagnosis not present

## 2021-12-30 DIAGNOSIS — Z83511 Family history of glaucoma: Secondary | ICD-10-CM | POA: Diagnosis not present

## 2021-12-30 DIAGNOSIS — H401211 Low-tension glaucoma, right eye, mild stage: Secondary | ICD-10-CM | POA: Diagnosis not present

## 2022-01-13 DIAGNOSIS — N952 Postmenopausal atrophic vaginitis: Secondary | ICD-10-CM | POA: Diagnosis not present

## 2022-01-13 DIAGNOSIS — Z124 Encounter for screening for malignant neoplasm of cervix: Secondary | ICD-10-CM | POA: Diagnosis not present

## 2022-01-13 DIAGNOSIS — L739 Follicular disorder, unspecified: Secondary | ICD-10-CM | POA: Diagnosis not present

## 2022-01-13 DIAGNOSIS — M8580 Other specified disorders of bone density and structure, unspecified site: Secondary | ICD-10-CM | POA: Diagnosis not present

## 2022-01-13 DIAGNOSIS — Z0142 Encounter for cervical smear to confirm findings of recent normal smear following initial abnormal smear: Secondary | ICD-10-CM | POA: Diagnosis not present

## 2022-01-13 DIAGNOSIS — Z01411 Encounter for gynecological examination (general) (routine) with abnormal findings: Secondary | ICD-10-CM | POA: Diagnosis not present

## 2022-01-13 DIAGNOSIS — Z6825 Body mass index (BMI) 25.0-25.9, adult: Secondary | ICD-10-CM | POA: Diagnosis not present

## 2022-01-13 DIAGNOSIS — Z01419 Encounter for gynecological examination (general) (routine) without abnormal findings: Secondary | ICD-10-CM | POA: Diagnosis not present

## 2022-02-02 ENCOUNTER — Telehealth: Payer: Self-pay | Admitting: Internal Medicine

## 2022-02-02 NOTE — Telephone Encounter (Signed)
Patient is requesting RX for Lorazepam 0.'5mg'$  that was previously prescribed by the pcp that she saw before switching to Dr. Alain Marion.  LOV 10-16-21 NOV 04-21-22  Pharmacy: [id number] [place] Please call patient and let her know whether or not the medication can be prescribed at [phone]

## 2022-02-03 MED ORDER — LORAZEPAM 0.5 MG PO TABS: 0.5000 mg | ORAL_TABLET | Freq: Two times a day (BID) | ORAL | 1 refills | Status: AC | PRN

## 2022-02-03 NOTE — Telephone Encounter (Signed)
Okay.  Done.  Thanks 

## 2022-02-03 NOTE — Telephone Encounter (Signed)
Notified pt rx sent to pharmacy.Marland KitchenJohny Torres

## 2022-02-05 ENCOUNTER — Encounter: Payer: Self-pay | Admitting: Emergency Medicine

## 2022-02-05 ENCOUNTER — Ambulatory Visit (INDEPENDENT_AMBULATORY_CARE_PROVIDER_SITE_OTHER): Payer: PPO | Admitting: Emergency Medicine

## 2022-02-05 VITALS — BP 110/66 | HR 65 | Temp 97.7°F | Ht 65.0 in | Wt 152.5 lb

## 2022-02-05 DIAGNOSIS — L729 Follicular cyst of the skin and subcutaneous tissue, unspecified: Secondary | ICD-10-CM

## 2022-02-05 DIAGNOSIS — L089 Local infection of the skin and subcutaneous tissue, unspecified: Secondary | ICD-10-CM | POA: Diagnosis not present

## 2022-02-05 DIAGNOSIS — L731 Pseudofolliculitis barbae: Secondary | ICD-10-CM | POA: Diagnosis not present

## 2022-02-05 MED ORDER — DOXYCYCLINE HYCLATE 100 MG PO TABS: 100.0000 mg | ORAL_TABLET | Freq: Two times a day (BID) | ORAL | 0 refills | Status: AC

## 2022-02-05 NOTE — Progress Notes (Signed)
Kristin Torres 71 y.o.   Chief Complaint  Patient presents with   bump under arm infected    Hair follicle under arm patient thinks is infected. Left arm     HISTORY OF PRESENT ILLNESS: Acute problem visit today.  Patient of [name] This is a 71 y.o. female complaining of red and swollen lump under left arm. No other complaints or medical concerns today.  HPI   Prior to Admission medications   Medication Sig Start Date End Date Taking? Authorizing Provider  aspirin EC 81 MG tablet Take 1 tablet (81 mg total) by mouth daily. 10/16/21 10/16/22 Yes Plotnikov, Evie Lacks, MD  CALCIUM-MAGNESIUM-ZINC PO Take 1 tablet by mouth daily.   Yes [provider]  Cholecalciferol (VITAMIN D3) 50 MCG (2000 UT) capsule Take 1 capsule (2,000 Units total) by mouth daily. 09/25/19  Yes Plotnikov, Evie Lacks, MD  clobetasol ointment (TEMOVATE) 1.61 % Apply 1 application topically 2 (two) times daily. 01/01/21  Yes Lavonna Monarch, MD  diltiazem (CARDIZEM CD) 180 MG 24 hr capsule TAKE 1 CAPSULE BY MOUTH EVERY DAY 12/30/21  Yes Plotnikov, Evie Lacks, MD  docusate sodium (COLACE) 100 MG capsule Take 1 capsule (100 mg total) by mouth 2 (two) times daily. 10/16/21  Yes Plotnikov, Evie Lacks, MD  dorzolamide-timolol (COSOPT) 22.3-6.8 MG/ML ophthalmic solution Place 1 drop into both eyes 2 (two) times daily. 03/20/20  Yes [provider]  EPINEPHrine 0.3 mg/0.3 mL IJ SOAJ injection Inject 0.3 mg into the muscle as needed for anaphylaxis. 11/07/21  Yes Plotnikov, Evie Lacks, MD  escitalopram (LEXAPRO) 5 MG tablet Take 1 tablet (5 mg total) by mouth at bedtime. 04/08/21  Yes Plotnikov, Evie Lacks, MD  estradiol (ESTRACE) 0.1 MG/GM vaginal cream Place vaginally. 12/17/20  Yes [provider]  fexofenadine (ALLEGRA) 180 MG tablet Take 180 mg by mouth daily as needed. allergies   Yes [provider]  latanoprost (XALATAN) 0.005 % ophthalmic solution  03/03/17  Yes [provider]   LORazepam (ATIVAN) 0.5 MG tablet Take 1 tablet (0.5 mg total) by mouth 2 (two) times daily as needed for anxiety. 02/03/22  Yes Plotnikov, Evie Lacks, MD  losartan-hydrochlorothiazide (HYZAAR) 50-12.5 MG tablet TAKE 1 TABLET BY MOUTH EVERY DAY 05/14/21  Yes Plotnikov, Evie Lacks, MD  meclizine (ANTIVERT) 12.5 MG tablet TAKE 1 TABLET BY MOUTH 3 TIMES DAILY AS NEEDED FOR DIZZINESS. 11/18/20  Yes Plotnikov, Evie Lacks, MD  nitrofurantoin, macrocrystal-monohydrate, (MACROBID) 100 MG capsule Take 1 capsule (100 mg total) by mouth 2 (two) times daily. 12/01/21  Yes Hoyt Koch, MD  promethazine-dextromethorphan (PROMETHAZINE-DM) 6.25-15 MG/5ML syrup Take 5 mLs by mouth 4 (four) times daily as needed for cough. 07/11/21  Yes Plotnikov, Evie Lacks, MD  zaleplon (SONATA) 10 MG capsule TAKE 1 CAPSULE BY MOUTH EVERY DAY AT BEDTIME AS NEEDED 01/01/22  Yes Plotnikov, Evie Lacks, MD  zaleplon (SONATA) 5 MG capsule Take 1 capsule (5 mg total) by mouth at bedtime as needed for sleep. 06/05/21  Yes Dohmeier, Asencion Partridge, MD    Allergies  Allergen Reactions   Bee Venom Anaphylaxis   Streptomycin Rash   Mango Flavor Swelling   Caffeine Palpitations   Other Rash   Penicillins Rash    Patient Active Problem List   Diagnosis Date Noted   Frequent urination 09/60/4540   Folliculitis 98/02/9146   Night sweats 10/16/2021   Vitamin D deficiency 10/16/2021   Constipation 10/16/2021   Moderate obstructive sleep apnea-hypopnea syndrome 09/03/2021   Insomnia associated  with menopause 06/05/2021   Snoring 06/05/2021   Dysphagia 04/08/2021   Insomnia 09/30/2020   LUQ abdominal pain 03/23/2018   Low-tension glaucoma of left eye, mild stage 01/27/2018   UTI (urinary tract infection) 01/26/2017   Family history of glaucoma 12/24/2015   Myopia of both eyes 12/24/2015   Other vitreous opacities, right eye 12/24/2015   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 12/24/2015   Regular astigmatism of both eyes 12/24/2015    Posterior vitreous detachment of right eye 10/04/2015   Patellofemoral syndrome of both knees 08/29/2015   Iliotibial band friction syndrome of both knees 08/29/2015   Palpitations 05/03/2015   Headache 05/03/2015   Grief 05/03/2015   Chest pressure 05/03/2015   Episodic lightheadedness 09/27/2014   Fatigue 09/27/2014   Stress 09/27/2014   HTN (hypertension) 05/17/2014   SVT (supraventricular tachycardia) 04/25/2014   LLQ abdominal pain 02/20/2014   Acute non-ST segment elevation myocardial infarction ([place]) 01/31/2014   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   Hymenoptera allergy 02/16/2012   Well adult exam 02/16/2012   Depression with anxiety 03/17/2011   ALLERGY 05/17/2008   Nocturia 05/09/2007   POLYP, COLON 05/03/2007   NECK PAIN, CHRONIC 05/03/2007   Backache 05/03/2007   LEG CRAMPS 05/03/2007    Past Medical History:  Diagnosis Date   Cervicalgia    Chronic back pain    Dermatophytosis of nail    GERD (gastroesophageal reflux disease)    History of pneumonia    Hypertension    Leg cramps    MI (myocardial infarction) ([place])    Nocturia    Personal history of other diseases of circulatory system    PSVT (paroxysmal supraventricular tachycardia) ([facility])    Sinusitis    SVT (supraventricular tachycardia) ([place])    Tubal pregnancy    Tubular adenoma of colon     Past Surgical History:  Procedure Laterality Date   ABLATION OF DYSRHYTHMIC FOCUS  04/25/2014   svt   APPENDECTOMY     BREAST EXCISIONAL BIOPSY Right 2011   benign  right breast   OOPHORECTOMY     POLYPECTOMY     SUPRAVENTRICULAR TACHYCARDIA ABLATION N/A 04/25/2014   Procedure: SUPRAVENTRICULAR TACHYCARDIA ABLATION;  Surgeon: Evans Lance, MD;  Location[facility] CATH LAB;  Service: Cardiovascular;  Laterality: N/A;   TUBAL LIGATION      Social History   Socioeconomic History   Marital status: Married    Spouse name: Kristin Torres   Number of children: 2   Years of education: Not on file    Highest education level: Professional school degree (e.g., MD, DDS, DVM, JD)  Occupational History   Occupation: microbiologist/retired    Employer: [employer]  Tobacco Use   Smoking status: Never   Smokeless tobacco: Never  Vaping Use   Vaping Use: Never used  Substance and Sexual Activity   Alcohol use: Yes    Alcohol/week: 3.0 standard drinks of alcohol    Types: 3 Glasses of wine per week    Comment: 1 per day   Drug use: No   Sexual activity: Not on file  Other Topics Concern   Not on file  Social History Narrative   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: Architectural technologist in private interpriseMarried '752 sons '76, '83Marriage in good healthHobbies avid skier   Right handed   Caffeine: none   Social Determinants of Health   Financial Resource Strain: Not on file  Food Insecurity: Not on file  Transportation Needs: Not on  file  Physical Activity: Not on file  Stress: Not on file  Social Connections: Not on file  Intimate Partner Violence: Not on file    Family History  Problem Relation Age of Onset   Diabetes Mother    Hypertension Mother    Glaucoma Mother    Heart attack Father 80   Glaucoma Brother    Breast cancer Maternal Grandmother    Diabetes Maternal Grandmother    Hypertension Maternal Grandmother    Hypertension Maternal Grandfather    Stomach cancer Paternal Grandmother    Colon cancer Neg Hx    Esophageal cancer Neg Hx      Review of Systems  Constitutional: Negative.  Negative for chills and fever.  HENT: Negative.  Negative for congestion and sore throat.   Respiratory: Negative.  Negative for cough and shortness of breath.   Cardiovascular: Negative.  Negative for chest pain and palpitations.  Gastrointestinal:  Negative for nausea and vomiting.  Genitourinary: Negative.  Negative for dysuria.  Neurological: Negative.  Negative for dizziness and headaches.  All other systems reviewed and are negative.  Today's Vitals    02/05/22 1519  BP: 110/66  Pulse: 65  Temp: 97.7 F (36.5 C)  TempSrc: Oral  SpO2: 96%  Weight: 152 lb 8 oz (69.2 kg)  Height: '5\' 5"'$  (1.651 m)   Body mass index is 25.38 kg/m.   Physical Exam Vitals reviewed.  Constitutional:      Appearance: Normal appearance.  HENT:     Head: Normocephalic.  Eyes:     Extraocular Movements: Extraocular movements intact.  Cardiovascular:     Rate and Rhythm: Normal rate.  Pulmonary:     Effort: Pulmonary effort is normal.  Skin:    General: Skin is warm and dry.     Comments: Left axilla: Infected ingrown hair  Neurological:     Mental Status: She is alert and oriented to person, place, and time.  Psychiatric:        Mood and Affect: Mood normal.        Behavior: Behavior normal.      ASSESSMENT & PLAN: A total of 32 minutes was spent with the patient and counseling/coordination of care regarding preparing for this visit, review of most recent office visit notes and available medical records, review of multiple chronic medical problems and their management, review of all medications, diagnosis of infected ingrown hair in left axillary area and need for antibiotics, prognosis, documentation and need for follow-up if no better or worse during the next several days.Agustina Caroli, MD [facility]

## 2022-02-05 NOTE — Assessment & Plan Note (Signed)
Most likely infected ingrown hair of left axillary region No abscess formation on clinical exam.  No fluctuation. Will benefit from cefadroxil 500 mg twice a day for 5 to 7 days Advised to contact the office if it is no better or worse during the next several days.

## 2022-02-05 NOTE — Patient Instructions (Signed)
Ingrown Hair  An ingrown hair is a hair that curls and re-enters the skin instead of growing straight out of the skin. An ingrown hair can develop in any part of the skin that hair is removed from. An ingrown hair may cause small pockets of infection. What are the causes? An ingrown hair may be caused by: Shaving. Tweezing. Waxing. Using a hair removal cream. What increases the risk? You are more likely to develop this condition if you have curly hair. What are the signs or symptoms? Symptoms of an ingrown hair may include: Small bumps on the skin. The bumps may be filled with pus. Pain. Itching. How is this diagnosed? An ingrown hair is diagnosed by a skin exam. How is this treated? Treatment is often not needed unless the ingrown hair has caused an infection. If needed, treatment may include: Applying prescription creams to the skin. This can help with inflammation. Applying warm compresses to the skin. This can help soften the skin. Taking antibiotic medicine. An antibiotic may be prescribed if the infection is severe. Retracting and releasing the ingrown hair tips. Hair removal by electrolysis or laser. Follow these instructions at home: Medicines Take, apply, or use over-the-counter and prescription medicines only as told by your health care provider. This includes any prescription creams. If you were prescribed an antibiotic medicine, take it as told by your health care provider. Do not stop using the antibiotic even if you start to feel better. General instructions Do not shave irritated areas of skin. You may start shaving these areas again once the irritation has gone away. To help remove ingrown hairs on your face, you may use a facial sponge in a gentle circular motion. Do not pick or squeeze the irritated area of skin as this may cause infection and scarring. Use a hair removal cream as told by your health care provider. Managing pain and swelling  If directed, apply  heat to the affected area as often as told by your health care provider. Use the heat source that your health care provider recommends, such as a moist heat pack or a heating pad. Place a towel between your skin and the heat source. Leave the heat on for 20-30 minutes. If your skin turns bright red, remove the heat right away to prevent burns. The risk of burns is higher if you cannot feel pain, heat, or cold. How is this prevented? Shower before shaving. Wrap areas that you are going to shave in warm, moist wraps for several minutes before shaving. The warmth and moisture help to soften the hairs and makes ingrown hairs less likely. Use thick shaving gels. Use a razor that cuts hair slightly above your skin, or use an electric shaver with a long shave setting. Shave in the direction of hair growth. Avoid making multiple razor strokes. Apply a moisturizing lotion after shaving. Summary An ingrown hair is a hair that curls and re-enters the skin instead of growing straight out of the skin. Treatment is often not needed unless the ingrown hair has caused an infection. Take, apply, or use over-the-counter and prescription medicines only as told by your health care provider. This includes any prescription creams. Do not shave irritated areas of skin. You may start shaving these areas again once the irritation has gone away. If directed, apply heat to the affected area. Use the heat source that your health care provider recommends, such as a moist heat pack or a heating pad. This information is not intended to replace  advice given to you by your health care provider. Make sure you discuss any questions you have with your health care provider. Document Revised: 05/28/2021 Document Reviewed: 05/28/2021 Elsevier Patient Education  [place].

## 2022-02-06 ENCOUNTER — Other Ambulatory Visit (HOSPITAL_COMMUNITY): Payer: Self-pay

## 2022-02-23 NOTE — Progress Notes (Addendum)
Subjective:   Kristin Torres is a 71 y.o. female who presents for an Initial Medicare Annual Wellness Visit. I connected with  Dandria Griego on 02/24/22 by a audio enabled telemedicine application and verified that I am speaking with the correct person using two identifiers.  Patient Location: Home  Provider Location: Home Office  I discussed the limitations of evaluation and management by telemedicine. The patient expressed understanding and agreed to proceed.  Review of Systems    Deferred to PCP Cardiac Risk Factors include: advanced age (>28mn, >>85women)     Objective:    There were no vitals filed for this visit. There is no height or weight on file to calculate BMI.     02/24/2022    1:35 PM 11/27/2015    8:01 AM 04/25/2014    6:19 AM  Advanced Directives  Does Patient Have a Medical Advance Directive? Yes Yes Yes  Type of AParamedicof [place]Living will [place] will [place] will  Does patient want to make changes to medical advance directive?   No - Patient declined  Copy of HMontrosein Chart? No - copy requested No - copy requested No - copy requested    Current Medications (verified) Outpatient Encounter Medications as of 02/24/2022  Medication Sig   ALPHAGAN P 0.1 % SOLN Apply to eye.   aspirin EC 81 MG tablet Take 1 tablet (81 mg total) by mouth daily.   Cholecalciferol (VITAMIN D3) 50 MCG (2000 UT) capsule Take 1 capsule (2,000 Units total) by mouth daily.   clobetasol ointment (TEMOVATE) 02.13% Apply 1 application topically 2 (two) times daily.   diltiazem (CARDIZEM CD) 180 MG 24 hr capsule TAKE 1 CAPSULE BY MOUTH EVERY DAY   EPINEPHrine 0.3 mg/0.3 mL IJ SOAJ injection Inject 0.3 mg into the muscle as needed for anaphylaxis.   escitalopram (LEXAPRO) 5 MG tablet Take 1 tablet (5 mg total) by mouth at bedtime.   estradiol (ESTRACE) 0.1 MG/GM vaginal cream Place  vaginally.   fexofenadine (ALLEGRA) 180 MG tablet Take 180 mg by mouth daily as needed. allergies   latanoprost (XALATAN) 0.005 % ophthalmic solution    LORazepam (ATIVAN) 0.5 MG tablet Take 1 tablet (0.5 mg total) by mouth 2 (two) times daily as needed for anxiety.   losartan-hydrochlorothiazide (HYZAAR) 50-12.5 MG tablet TAKE 1 TABLET BY MOUTH EVERY DAY   meclizine (ANTIVERT) 12.5 MG tablet TAKE 1 TABLET BY MOUTH 3 TIMES DAILY AS NEEDED FOR DIZZINESS.   nitrofurantoin, macrocrystal-monohydrate, (MACROBID) 100 MG capsule Take 1 capsule (100 mg total) by mouth 2 (two) times daily.   promethazine-dextromethorphan (PROMETHAZINE-DM) 6.25-15 MG/5ML syrup Take 5 mLs by mouth 4 (four) times daily as needed for cough.   zaleplon (SONATA) 10 MG capsule TAKE 1 CAPSULE BY MOUTH EVERY DAY AT BEDTIME AS NEEDED   CALCIUM-MAGNESIUM-ZINC PO Take 1 tablet by mouth daily. (Patient not taking: Reported on 02/24/2022)   docusate sodium (COLACE) 100 MG capsule Take 1 capsule (100 mg total) by mouth 2 (two) times daily. (Patient not taking: Reported on 02/24/2022)   zaleplon (SONATA) 5 MG capsule Take 1 capsule (5 mg total) by mouth at bedtime as needed for sleep. (Patient not taking: Reported on 02/24/2022)   [DISCONTINUED] dorzolamide-timolol (COSOPT) 22.3-6.8 MG/ML ophthalmic solution Place 1 drop into both eyes 2 (two) times daily. (Patient not taking: Reported on 02/24/2022)   No facility-administered encounter medications on file as of 02/24/2022.  Allergies (verified) Bee venom, Streptomycin, Timolol, Mango flavor, Caffeine, Other, and Penicillins   History: Past Medical History:  Diagnosis Date   Cervicalgia    Chronic back pain    Dermatophytosis of nail    GERD (gastroesophageal reflux disease)    History of pneumonia    Hypertension    Leg cramps    MI (myocardial infarction) ([place])    Nocturia    Personal history of other diseases of circulatory system    PSVT (paroxysmal supraventricular  tachycardia)    Sinusitis    SVT (supraventricular tachycardia)    Tubal pregnancy    Tubular adenoma of colon    Past Surgical History:  Procedure Laterality Date   ABLATION OF DYSRHYTHMIC FOCUS  04/25/2014   svt   APPENDECTOMY     BREAST EXCISIONAL BIOPSY Right 2011   benign  right breast   OOPHORECTOMY     POLYPECTOMY     SUPRAVENTRICULAR TACHYCARDIA ABLATION N/A 04/25/2014   Procedure: SUPRAVENTRICULAR TACHYCARDIA ABLATION;  Surgeon: Evans Lance, MD;  Location: [facility];  Service: Cardiovascular;  Laterality: N/A;   TUBAL LIGATION     Family History  Problem Relation Age of Onset   Diabetes Mother    Hypertension Mother    Glaucoma Mother    Heart attack Father 107   Glaucoma Brother    Breast cancer Maternal Grandmother    Diabetes Maternal Grandmother    Hypertension Maternal Grandmother    Hypertension Maternal Grandfather    Stomach cancer Paternal Grandmother    Colon cancer Neg Hx    Esophageal cancer Neg Hx    Social History   Socioeconomic History   Marital status: Married    Spouse name: Broadus John   Number of children: 2   Years of education: Not on file   Highest education level: Professional school degree (e.g., MD, DDS, DVM, JD)  Occupational History   Occupation: microbiologist/retired    Employer: [employer]  Tobacco Use   Smoking status: Never   Smokeless tobacco: Never  Vaping Use   Vaping Use: Never used  Substance and Sexual Activity   Alcohol use: Yes    Alcohol/week: 3.0 standard drinks of alcohol    Types: 3 Glasses of Gian Ybarra per week    Comment: 1 per day   Drug use: No   Sexual activity: Yes  Other Topics Concern   Not on file  Social History Narrative   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: Architectural technologist in private interpriseMarried '752 sons '76, '83Marriage in good healthHobbies avid skier   Right handed   Caffeine: none   Social Determinants of Health   Financial Resource Strain: Low Risk  (02/24/2022)    Overall Financial Resource Strain (CARDIA)    Difficulty of Paying Living Expenses: Not hard at all  Food Insecurity: No Food Insecurity (02/24/2022)   Hunger Vital Sign    Worried About Running Out of Food in the Last Year: Never true    [place] in the Last Year: Never true  Transportation Needs: No Transportation Needs (02/24/2022)   PRAPARE - Hydrologist (Medical): No    Lack of Transportation (Non-Medical): No  Physical Activity: Sufficiently Active (02/24/2022)   Exercise Vital Sign    Days of Exercise per Week: 7 days    Minutes of Exercise per Session: 40 min  Stress: No Stress Concern Present (02/24/2022)   [place]  Feeling of Stress : Only a little  Social Connections: Moderately Integrated (02/24/2022)   Social Connection and Isolation Panel [NHANES]    Frequency of Communication with Friends and Family: More than three times a week    Frequency of Social Gatherings with Friends and Family: More than three times a week    Attends Religious Services: Never    Marine scientist or Organizations: Yes    Attends Music therapist: More than 4 times per year    Marital Status: Married    Tobacco Counseling Counseling given: Not Answered   Clinical Intake:  Pre-visit preparation completed: Yes  Pain : No/denies pain     Nutritional Status: BMI 25 -29 Overweight Nutritional Risks: None Diabetes: No  How often do you need to have someone help you when you read instructions, pamphlets, or other written materials from your doctor or pharmacy?: 1 - Never What is the last grade level you completed in school?: college  Diabetic?No  Interpreter Needed?: No  Information entered by :: Emelia Loron RN   Activities of Daily Living    02/24/2022    1:35 PM  In your present state of health, do you have any difficulty performing the following activities:   Hearing? 0  Vision? 0  Difficulty concentrating or making decisions? 0  Walking or climbing stairs? 0  Dressing or bathing? 0  Doing errands, shopping? 0  Preparing Food and eating ? N  Using the Toilet? N  In the past six months, have you accidently leaked urine? N  Do you have problems with loss of bowel control? N  Managing your Medications? N  Managing your Finances? N  Housekeeping or managing your Housekeeping? N    Patient Care Team: Plotnikov, Evie Lacks, MD as PCP - General (Internal Medicine) Lafayette Dragon, MD (Inactive) as Consulting Physician (Gastroenterology) Sharyne Peach, MD as Consulting Physician (Ophthalmology) Azucena Fallen, MD as Consulting Physician (Obstetrics and Gynecology)  Indicate any recent Medical Services you may have received from other than [facility] providers in the past year (date may be approximate).     Assessment:   This is a routine wellness examination for Kannon.  Hearing/Vision screen No results found.  Dietary issues and exercise activities discussed: Current Exercise Habits: Home exercise routine;Structured exercise class, Type of exercise: walking;strength training/weights;calisthenics (gym classes, zumba), Time (Minutes): 40, Frequency (Times/Week): 7, Weekly Exercise (Minutes/Week): 280, Exercise limited by: None identified   Goals Addressed             This Visit's Progress    Patient Stated       I want to lose 5 pounds by continuing to eat healthy and routinely exercise.      Depression Screen    02/24/2022    1:49 PM 02/05/2022    3:20 PM 12/01/2021    3:43 PM 10/16/2021    1:53 PM 09/26/2020   10:27 AM  PHQ 2/9 Scores  PHQ - 2 Score 0 0 0 0 0  PHQ- 9 Score   0  0    Fall Risk    02/24/2022    1:36 PM 02/05/2022    3:20 PM 12/01/2021    3:43 PM 10/16/2021    1:53 PM 09/26/2020   10:27 AM  [place] in the past year? 0 0 0 0 0  Number falls in past yr: 0 0 0 0 0  Injury with Fall? 0 0 0 0 0  Risk for  fall due to : No Fall Risks No Fall Risks No Fall Risks No Fall Risks No Fall Risks  Follow up Falls evaluation completed Falls evaluation completed Falls evaluation completed Falls evaluation completed     [place]:  Any stairs in or around the home? Yes  If so, are there any without handrails? Yes  Home free of loose throw rugs in walkways, pet beds, electrical cords, etc? Yes  Adequate lighting in your home to reduce risk of falls? Yes   ASSISTIVE DEVICES UTILIZED TO PREVENT FALLS:  Life alert? No  Use of a cane, walker or w/c? No  Grab bars in the bathroom? No  Shower chair or bench in shower? No  Elevated toilet seat or a handicapped toilet? No   Cognitive Function:        02/24/2022    1:36 PM  6CIT Screen  What Year? 0 points  What month? 0 points  What time? 0 points  Count back from 20 0 points  Months in reverse 0 points  Repeat phrase 0 points  Total Score 0 points    Immunizations Immunization History  Administered Date(s) Administered   Fluad Quad(high Dose 65+) 01/23/2021, 01/14/2022   Influenza Whole 01/18/2008, 01/18/2012   Influenza, High Dose Seasonal PF 02/24/2019   Influenza-Unspecified 01/18/2014, 03/05/2016, 12/17/2016, 01/21/2018   PFIZER Comirnaty(Gray Top)Covid-19 Tri-Sucrose Vaccine 11/11/2020   PFIZER(Purple Top)SARS-COV-2 Vaccination 05/22/2019, 07/05/2019, 01/15/2020   Pfizer Covid-19 Vaccine Bivalent Booster 46yr & up 03/19/2021   Tdap 02/20/2013   Unspecified SARS-COV-2 Vaccination 01/14/2022   Zoster, Live 03/01/2013    TDAP status: Up to date  Flu Vaccine status: Up to date  Pneumococcal vaccine status: Due, Education has been provided regarding the importance of this vaccine. Advised may receive this vaccine at local pharmacy or Health Dept. Aware to provide a copy of the vaccination record if obtained from local pharmacy or Health Dept. Verbalized acceptance and understanding.  Covid-19  vaccine status: Completed vaccines  Qualifies for Shingles Vaccine? Yes   Zostavax completed No   Shingrix Completed?: No.    Education has been provided regarding the importance of this vaccine. Patient has been advised to call insurance company to determine out of pocket expense if they have not yet received this vaccine. Advised may also receive vaccine at local pharmacy or Health Dept. Verbalized acceptance and understanding.  Screening Tests Health Maintenance  Topic Date Due   Hepatitis C Screening  Never done   Zoster Vaccines- Shingrix (1 of 2) Never done   Pneumonia Vaccine 69 Years old (1 - PCV) Never done   COVID-19 Vaccine (7 - Pfizer risk series) 03/11/2022   TETANUS/TDAP  02/21/2023   Medicare Annual Wellness (AWV)  02/25/2023   MAMMOGRAM  10/18/2023   COLONOSCOPY (Pts 45-473yrInsurance coverage will need to be confirmed)  12/15/2025   INFLUENZA VACCINE  Completed   DEXA SCAN  Completed   HPV VACCINES  Aged Out    Health Maintenance  Health Maintenance Due  Topic Date Due   Hepatitis C Screening  Never done   Zoster Vaccines- Shingrix (1 of 2) Never done   Pneumonia Vaccine 6545Years old (1 - PCV) Never done    Colorectal cancer screening: Type of screening: Colonoscopy. Completed 12/16/15. Repeat every 10 years  Mammogram status: Completed 10/17/21. Repeat every year  Bone Density status: Completed 12/26/20. Results reflect: Bone density results: OSTEOPENIA. Repeat every 2 years.  Lung Cancer Screening: (Low Dose CT Chest  recommended if Age 49-80 years, 55 pack-year currently smoking OR have quit w/in 15years.) does not qualify.   Additional Screening:  Hepatitis C Screening: does qualify; Completed education provided  Vision Screening: Recommended annual ophthalmology exams for early detection of glaucoma and other disorders of the eye. Is the patient up to date with their annual eye exam?  Yes  Who is the provider or what is the name of the office in which  the patient attends annual eye exams? [facility] If pt is not established with a provider, would they like to be referred to a provider to establish care?  N/A .   Dental Screening: Recommended annual dental exams for proper oral hygiene  Community Resource Referral / Chronic Care Management: CRR required this visit?  No   CCM required this visit?  No      Plan:     I have personally reviewed and noted the following in the patient's chart:   Medical and social history Use of alcohol, tobacco or illicit drugs  Current medications and supplements including opioid prescriptions. Patient is not currently taking opioid prescriptions. Functional ability and status Nutritional status Physical activity Advanced directives List of other physicians Hospitalizations, surgeries, and ER visits in previous 12 months Vitals Screenings to include cognitive, depression, and falls Referrals and appointments  In addition, I have reviewed and discussed with patient certain preventive protocols, quality metrics, and best practice recommendations. A written personalized care plan for preventive services as well as general preventive health recommendations were provided to patient.     Michiel Cowboy, RN   02/24/2022   Nurse Notes:  Ms. Paff , Thank you for taking time to come for your Medicare Wellness Visit. I appreciate your ongoing commitment to your health goals. Please review the following plan we discussed and let me know if I can assist you in the future.   These are the goals we discussed:  Goals      Patient Stated     I want to lose 5 pounds by continuing to eat healthy and routinely exercise.        This is a list of the screening recommended for you and due dates:  Health Maintenance  Topic Date Due   Hepatitis C Screening: USPSTF Recommendation to screen - Ages 25-79 yo.  Never done   Zoster (Shingles) Vaccine (1 of 2) Never done   Pneumonia  Vaccine (1 - PCV) Never done   COVID-19 Vaccine (7 - Pfizer risk series) 03/11/2022   Tetanus Vaccine  02/21/2023   Medicare Annual Wellness Visit  02/25/2023   Mammogram  10/18/2023   Colon Cancer Screening  12/15/2025   Flu Shot  Completed   DEXA scan (bone density measurement)  Completed   HPV Vaccine  Aged Out     Medical screening examination/treatment/procedure(s) were performed by non-physician practitioner and as supervising physician I was immediately available for consultation/collaboration.  I agree with above. Lew Dawes, MD

## 2022-02-23 NOTE — Patient Instructions (Signed)

## 2022-02-24 ENCOUNTER — Ambulatory Visit (INDEPENDENT_AMBULATORY_CARE_PROVIDER_SITE_OTHER): Payer: PPO | Admitting: *Deleted

## 2022-02-24 DIAGNOSIS — Z0000 Encounter for general adult medical examination without abnormal findings: Secondary | ICD-10-CM | POA: Diagnosis not present

## 2022-03-17 DIAGNOSIS — D2372 Other benign neoplasm of skin of left lower limb, including hip: Secondary | ICD-10-CM | POA: Diagnosis not present

## 2022-03-17 DIAGNOSIS — L814 Other melanin hyperpigmentation: Secondary | ICD-10-CM | POA: Diagnosis not present

## 2022-03-17 DIAGNOSIS — I8393 Asymptomatic varicose veins of bilateral lower extremities: Secondary | ICD-10-CM | POA: Diagnosis not present

## 2022-03-17 DIAGNOSIS — L821 Other seborrheic keratosis: Secondary | ICD-10-CM | POA: Diagnosis not present

## 2022-03-17 DIAGNOSIS — D2361 Other benign neoplasm of skin of right upper limb, including shoulder: Secondary | ICD-10-CM | POA: Diagnosis not present

## 2022-03-17 DIAGNOSIS — D1801 Hemangioma of skin and subcutaneous tissue: Secondary | ICD-10-CM | POA: Diagnosis not present

## 2022-03-18 ENCOUNTER — Encounter: Payer: Self-pay | Admitting: Internal Medicine

## 2022-03-19 ENCOUNTER — Other Ambulatory Visit: Payer: Self-pay | Admitting: Internal Medicine

## 2022-03-19 MED ORDER — SCOPOLAMINE 1 MG/3DAYS TD PT72: 1.0000 | MEDICATED_PATCH | TRANSDERMAL | 0 refills | Status: DC | PRN

## 2022-04-21 ENCOUNTER — Ambulatory Visit: Payer: PPO | Admitting: Internal Medicine

## 2022-04-27 NOTE — Progress Notes (Unsigned)
[facility] progress note:  History: Kristin Torres 04/28/2022  Referring provider: Cassandria Anger, MD  Reason for consult/chief complaint: No chief complaint on file.   Subjective  HPI: Kristin Torres was last seen in the office April 2023 for chronic constipation, at which time she was also describing intermittent feelings of liquids or pills feeling stuck in the neck.  It sounded benign -no prior upper endoscopy, non-smoker.  ***   ROS:  Review of Systems   Past Medical History: Past Medical History:  Diagnosis Date   Cervicalgia    Chronic back pain    Dermatophytosis of nail    GERD (gastroesophageal reflux disease)    History of pneumonia    Hypertension    Leg cramps    MI (myocardial infarction) ([place])    Nocturia    Personal history of other diseases of circulatory system    PSVT (paroxysmal supraventricular tachycardia)    Sinusitis    SVT (supraventricular tachycardia)    Tubal pregnancy    Tubular adenoma of colon      Past Surgical History: Past Surgical History:  Procedure Laterality Date   ABLATION OF DYSRHYTHMIC FOCUS  04/25/2014   svt   APPENDECTOMY     BREAST EXCISIONAL BIOPSY Right 2011   benign  right breast   OOPHORECTOMY     POLYPECTOMY     SUPRAVENTRICULAR TACHYCARDIA ABLATION N/A 04/25/2014   Procedure: SUPRAVENTRICULAR TACHYCARDIA ABLATION;  Surgeon: Evans Lance, MD;  Location: [facility] CATH LAB;  Service: Cardiovascular;  Laterality: N/A;   TUBAL LIGATION       Family History: Family History  Problem Relation Age of Onset   Diabetes Mother    Hypertension Mother    Glaucoma Mother    Heart attack Father 78   Glaucoma Brother    Breast cancer Maternal Grandmother    Diabetes Maternal Grandmother    Hypertension Maternal Grandmother    Hypertension Maternal Grandfather    Stomach cancer Paternal Grandmother    Colon cancer Neg Hx    Esophageal cancer Neg Hx     Social History: Social History    Socioeconomic History   Marital status: Married    Spouse name: Broadus John   Number of children: 2   Years of education: Not on file   Highest education level: Professional school degree (e.g., MD, DDS, DVM, JD)  Occupational History   Occupation: microbiologist/retired    Employer: [employer]  Tobacco Use   Smoking status: Never   Smokeless tobacco: Never  Vaping Use   Vaping Use: Never used  Substance and Sexual Activity   Alcohol use: Yes    Alcohol/week: 3.0 standard drinks of alcohol    Types: 3 Glasses of wine per week    Comment: 1 per day   Drug use: No   Sexual activity: Yes  Other Topics Concern   Not on file  Social History Narrative   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: Architectural technologist in private interpriseMarried '752 sons '76, '83Marriage in good healthHobbies avid skier   Right handed   Caffeine: none   Social Determinants of Health   Financial Resource Strain: Low Risk  (02/24/2022)   Overall Financial Resource Strain (CARDIA)    Difficulty of Paying Living Expenses: Not hard at all  Food Insecurity: No Food Insecurity (02/24/2022)   Hunger Vital Sign    Worried About Running Out of Food in the Last Year: Never true    [place] in  the Last Year: Never true  Transportation Needs: No Transportation Needs (02/24/2022)   PRAPARE - Hydrologist (Medical): No    Lack of Transportation (Non-Medical): No  Physical Activity: Sufficiently Active (02/24/2022)   Exercise Vital Sign    Days of Exercise per Week: 7 days    Minutes of Exercise per Session: 40 min  Stress: No Stress Concern Present (02/24/2022)   [place]    Feeling of Stress : Only a little  Social Connections: Moderately Integrated (02/24/2022)   Social Connection and Isolation Panel [NHANES]    Frequency of Communication with Friends and Family: More than three times a week     Frequency of Social Gatherings with Friends and Family: More than three times a week    Attends Religious Services: Never    Marine scientist or Organizations: Yes    Attends Music therapist: More than 4 times per year    Marital Status: Married    Allergies: Allergies  Allergen Reactions   Bee Venom Anaphylaxis   Streptomycin Rash   Timolol Other (See Comments)    Severe eye irritation and eye swelling   Mango Flavor Swelling   Caffeine Palpitations   Other Rash   Penicillins Rash    Outpatient Meds: Current Outpatient Medications  Medication Sig Dispense Refill   ALPHAGAN P 0.1 % SOLN Apply to eye.     aspirin EC 81 MG tablet Take 1 tablet (81 mg total) by mouth daily. 100 tablet 3   CALCIUM-MAGNESIUM-ZINC PO Take 1 tablet by mouth daily. (Patient not taking: Reported on 02/24/2022)     Cholecalciferol (VITAMIN D3) 50 MCG (2000 UT) capsule Take 1 capsule (2,000 Units total) by mouth daily. 100 capsule 3   clobetasol ointment (TEMOVATE) 7.61 % Apply 1 application topically 2 (two) times daily. 30 g 0   diltiazem (CARDIZEM CD) 180 MG 24 hr capsule TAKE 1 CAPSULE BY MOUTH EVERY DAY 90 capsule 2   docusate sodium (COLACE) 100 MG capsule Take 1 capsule (100 mg total) by mouth 2 (two) times daily. (Patient not taking: Reported on 02/24/2022) 10 capsule 0   EPINEPHrine 0.3 mg/0.3 mL IJ SOAJ injection Inject 0.3 mg into the muscle as needed for anaphylaxis. 2 each 3   escitalopram (LEXAPRO) 5 MG tablet Take 1 tablet (5 mg total) by mouth at bedtime. 30 tablet 5   estradiol (ESTRACE) 0.1 MG/GM vaginal cream Place vaginally.     fexofenadine (ALLEGRA) 180 MG tablet Take 180 mg by mouth daily as needed. allergies     latanoprost (XALATAN) 0.005 % ophthalmic solution      LORazepam (ATIVAN) 0.5 MG tablet Take 1 tablet (0.5 mg total) by mouth 2 (two) times daily as needed for anxiety. 60 tablet 1   losartan-hydrochlorothiazide (HYZAAR) 50-12.5 MG tablet TAKE 1 TABLET BY  MOUTH EVERY DAY 90 tablet 2   meclizine (ANTIVERT) 12.5 MG tablet TAKE 1 TABLET BY MOUTH 3 TIMES DAILY AS NEEDED FOR DIZZINESS. 60 tablet 0   nitrofurantoin, macrocrystal-monohydrate, (MACROBID) 100 MG capsule Take 1 capsule (100 mg total) by mouth 2 (two) times daily. 10 capsule 0   promethazine-dextromethorphan (PROMETHAZINE-DM) 6.25-15 MG/5ML syrup Take 5 mLs by mouth 4 (four) times daily as needed for cough. 240 mL 0   scopolamine (TRANSDERM-SCOP) 1 MG/3DAYS Place 1 patch (1.5 mg total) onto the skin every 3 (three) days as needed. 8 patch 0   zaleplon (SONATA)  10 MG capsule TAKE 1 CAPSULE BY MOUTH EVERY DAY AT BEDTIME AS NEEDED 30 capsule 3   zaleplon (SONATA) 5 MG capsule Take 1 capsule (5 mg total) by mouth at bedtime as needed for sleep. (Patient not taking: Reported on 02/24/2022) 30 capsule 0   No current facility-administered medications for this visit.      ___________________________________________________________________ Objective   Exam:  There were no vitals taken for this visit. Wt Readings from Last 3 Encounters:  02/05/22 152 lb 8 oz (69.2 kg)  12/01/21 152 lb 6.4 oz (69.1 kg)  10/16/21 153 lb (69.4 kg)    General: ***  Eyes: sclera anicteric, no redness ENT: oral mucosa moist without lesions, no cervical or supraclavicular lymphadenopathy CV: ***, no JVD, no peripheral edema Resp: clear to auscultation bilaterally, normal RR and effort noted GI: soft, *** tenderness, with active bowel sounds. No guarding or palpable organomegaly noted. Skin; warm and dry, no rash or jaundice noted Neuro: awake, alert and oriented x 3. Normal gross motor function and fluent speech  Labs:  ***  Radiologic Studies:  ***  Assessment: No diagnosis found.  ***  Plan:  ***  Thank you for the courtesy of this consult.  Please call me with any questions or concerns.  Nelida Meuse III  CC: Referring provider noted above

## 2022-04-28 ENCOUNTER — Encounter: Payer: Self-pay | Admitting: Gastroenterology

## 2022-04-28 ENCOUNTER — Ambulatory Visit: Payer: PPO | Admitting: Gastroenterology

## 2022-04-28 VITALS — BP 116/64 | HR 77 | Ht 65.0 in | Wt 151.2 lb

## 2022-04-28 DIAGNOSIS — K5909 Other constipation: Secondary | ICD-10-CM

## 2022-04-28 DIAGNOSIS — R1314 Dysphagia, pharyngoesophageal phase: Secondary | ICD-10-CM

## 2022-04-28 NOTE — Patient Instructions (Signed)
_______________________________________________________  If you are age 70 or older, your body mass index should be between 23-30. Your Body mass index is 25.17 kg/m. If this is out of the aforementioned range listed, please consider follow up with your Primary Care Provider.  If you are age 19 or younger, your body mass index should be between 19-25. Your Body mass index is 25.17 kg/m. If this is out of the aformentioned range listed, please consider follow up with your Primary Care Provider.   ________________________________________________________  The [facility] providers would like to encourage you to use [facility] to communicate with providers for non-urgent requests or questions.  Due to long hold times on the telephone, sending your provider a message by [facility] may be a faster and more efficient way to get a response.  Please allow 48 business hours for a response.  Please remember that this is for non-urgent requests.  _______________________________________________________  Dennis Bast have been scheduled for a Barium Esophogram at [facility] (1st floor of the hospital) on 05-05-2022 at 9am. Please arrive 30 minutes prior to your appointment for registration. Make certain not to have anything to eat or drink 3 hours prior to your test. If you need to reschedule for any reason, please contact radiology at [phone] to do so. __________________________________________________________________ A barium swallow is an examination that concentrates on views of the esophagus. This tends to be a double contrast exam (barium and two liquids which, when combined, create a gas to distend the wall of the oesophagus) or single contrast (non-ionic iodine based). The study is usually tailored to your symptoms so a good history is essential. Attention is paid during the study to the form, structure and configuration of the esophagus, looking for functional disorders (such as aspiration, dysphagia,  achalasia, motility and reflux) EXAMINATION You may be asked to change into a gown, depending on the type of swallow being performed. A radiologist and radiographer will perform the procedure. The radiologist will advise you of the type of contrast selected for your procedure and direct you during the exam. You will be asked to stand, sit or lie in several different positions and to hold a small amount of fluid in your mouth before being asked to swallow while the imaging is performed .In some instances you may be asked to swallow barium coated marshmallows to assess the motility of a solid food bolus. The exam can be recorded as a digital or video fluoroscopy procedure. POST PROCEDURE It will take 1-2 days for the barium to pass through your system. To facilitate this, it is important, unless otherwise directed, to increase your fluids for the next 24-48hrs and to resume your normal diet.  This test typically takes about 30 minutes to perform. __________________________________________________________________________________  It was a pleasure to see you today!  Thank you for trusting me with your gastrointestinal care!

## 2022-05-05 ENCOUNTER — Ambulatory Visit (HOSPITAL_COMMUNITY)
Admission: RE | Admit: 2022-05-05 | Discharge: 2022-05-05 | Disposition: A | Discharge status: Home / Self Care | Payer: PPO | Source: Ambulatory Visit | Attending: Gastroenterology | Admitting: Gastroenterology

## 2022-05-05 DIAGNOSIS — R1310 Dysphagia, unspecified: Secondary | ICD-10-CM | POA: Diagnosis not present

## 2022-05-05 DIAGNOSIS — R1314 Dysphagia, pharyngoesophageal phase: Secondary | ICD-10-CM

## 2022-05-05 DIAGNOSIS — K5909 Other constipation: Secondary | ICD-10-CM | POA: Diagnosis not present

## 2022-05-06 DIAGNOSIS — H5213 Myopia, bilateral: Secondary | ICD-10-CM | POA: Diagnosis not present

## 2022-05-06 DIAGNOSIS — H401222 Low-tension glaucoma, left eye, moderate stage: Secondary | ICD-10-CM | POA: Diagnosis not present

## 2022-05-06 DIAGNOSIS — H52203 Unspecified astigmatism, bilateral: Secondary | ICD-10-CM | POA: Diagnosis not present

## 2022-05-06 DIAGNOSIS — H401211 Low-tension glaucoma, right eye, mild stage: Secondary | ICD-10-CM | POA: Diagnosis not present

## 2022-05-06 DIAGNOSIS — H524 Presbyopia: Secondary | ICD-10-CM | POA: Diagnosis not present

## 2022-05-06 DIAGNOSIS — H43811 Vitreous degeneration, right eye: Secondary | ICD-10-CM | POA: Diagnosis not present

## 2022-05-06 DIAGNOSIS — H2513 Age-related nuclear cataract, bilateral: Secondary | ICD-10-CM | POA: Diagnosis not present

## 2022-05-06 DIAGNOSIS — Z83511 Family history of glaucoma: Secondary | ICD-10-CM | POA: Diagnosis not present

## 2022-07-28 ENCOUNTER — Other Ambulatory Visit: Payer: Self-pay | Admitting: Internal Medicine

## 2022-08-22 ENCOUNTER — Encounter: Payer: Self-pay | Admitting: Internal Medicine

## 2022-08-22 ENCOUNTER — Other Ambulatory Visit: Payer: Self-pay | Admitting: Internal Medicine

## 2022-08-24 MED ORDER — NITROFURANTOIN MONOHYD MACRO 100 MG PO CAPS: 100.0000 mg | ORAL_CAPSULE | Freq: Two times a day (BID) | ORAL | 0 refills | Status: DC

## 2022-08-31 ENCOUNTER — Other Ambulatory Visit: Payer: Self-pay | Admitting: Internal Medicine

## 2022-08-31 MED ORDER — NITROFURANTOIN MONOHYD MACRO 100 MG PO CAPS: 100.0000 mg | ORAL_CAPSULE | Freq: Two times a day (BID) | ORAL | 0 refills | Status: DC

## 2022-09-01 MED ORDER — NITROFURANTOIN MONOHYD MACRO 100 MG PO CAPS: 100.0000 mg | ORAL_CAPSULE | Freq: Two times a day (BID) | ORAL | 0 refills | Status: DC

## 2022-09-01 NOTE — Addendum Note (Signed)
Addended by: Deatra James on: 09/01/2022 08:11 AM   Modules accepted: Orders

## 2022-09-01 NOTE — Progress Notes (Cosign Needed Addendum)
1st transmission failed resent rx to [facility]../lmb  Medical screening examination/treatment/procedure(s) were performed by non-physician practitioner and as supervising physician I was immediately available for consultation/collaboration.  I agree with above. Jacinta Shoe, MD

## 2022-11-04 DIAGNOSIS — Z1231 Encounter for screening mammogram for malignant neoplasm of breast: Secondary | ICD-10-CM | POA: Diagnosis not present

## 2022-11-04 LAB — HM MAMMOGRAPHY

## 2022-11-05 ENCOUNTER — Ambulatory Visit (INDEPENDENT_AMBULATORY_CARE_PROVIDER_SITE_OTHER): Payer: PPO | Admitting: Internal Medicine

## 2022-11-05 ENCOUNTER — Encounter: Payer: Self-pay | Admitting: Internal Medicine

## 2022-11-05 VITALS — BP 122/60 | HR 67 | Temp 98.6°F | Ht 65.0 in | Wt 146.0 lb

## 2022-11-05 DIAGNOSIS — Z0000 Encounter for general adult medical examination without abnormal findings: Secondary | ICD-10-CM | POA: Diagnosis not present

## 2022-11-05 DIAGNOSIS — I471 Supraventricular tachycardia, unspecified: Secondary | ICD-10-CM

## 2022-11-05 DIAGNOSIS — F439 Reaction to severe stress, unspecified: Secondary | ICD-10-CM

## 2022-11-05 NOTE — Progress Notes (Unsigned)
Subjective:  Patient ID: Kristin Torres, female    DOB: 1951-04-14  Age: 72 y.o. MRN: [id number]  CC: Annual Exam   HPI Kristin Torres presents for stress w/son-relationship issue.  The problems are recurrent.  Most tend to get along very well. Well exam  Outpatient Medications Prior to Visit  Medication Sig Dispense Refill   ALPHAGAN P 0.1 % SOLN Apply to eye.     Cholecalciferol (VITAMIN D3) 50 MCG (2000 UT) capsule Take 1 capsule (2,000 Units total) by mouth daily. 100 capsule 3   diltiazem (CARDIZEM CD) 180 MG 24 hr capsule Take 1 capsule (180 mg total) by mouth daily. Annual appt due in June must see provider for future refills 90 capsule 0   docusate sodium (COLACE) 100 MG capsule Take 1 capsule (100 mg total) by mouth 2 (two) times daily. 10 capsule 0   EPINEPHrine 0.3 mg/0.3 mL IJ SOAJ injection Inject 0.3 mg into the muscle as needed for anaphylaxis. 2 each 3   escitalopram (LEXAPRO) 5 MG tablet Take 1 tablet (5 mg total) by mouth at bedtime. 30 tablet 5   estradiol (ESTRACE) 0.1 MG/GM vaginal cream Place vaginally.     fexofenadine (ALLEGRA) 180 MG tablet Take 180 mg by mouth daily as needed. allergies     latanoprost (XALATAN) 0.005 % ophthalmic solution      LORazepam (ATIVAN) 0.5 MG tablet Take 1 tablet (0.5 mg total) by mouth 2 (two) times daily as needed for anxiety. 60 tablet 1   losartan-hydrochlorothiazide (HYZAAR) 50-12.5 MG tablet TAKE 1 TABLET BY MOUTH EVERY DAY 90 tablet 2   meclizine (ANTIVERT) 12.5 MG tablet TAKE 1 TABLET BY MOUTH 3 TIMES DAILY AS NEEDED FOR DIZZINESS. 60 tablet 0   nitrofurantoin, macrocrystal-monohydrate, (MACROBID) 100 MG capsule Take 1 capsule (100 mg total) by mouth 2 (two) times daily. 10 capsule 0   promethazine-dextromethorphan (PROMETHAZINE-DM) 6.25-15 MG/5ML syrup Take 5 mLs by mouth 4 (four) times daily as needed for cough. 240 mL 0   zaleplon (SONATA) 10 MG capsule TAKE 1 CAPSULE BY MOUTH EVERY DAY AT BEDTIME AS NEEDED 30 capsule 3    CALCIUM-MAGNESIUM-ZINC PO Take 1 tablet by mouth daily. (Patient not taking: Reported on 02/24/2022)     clobetasol ointment (TEMOVATE) 0.05 % Apply 1 application topically 2 (two) times daily. (Patient not taking: Reported on 04/28/2022) 30 g 0   scopolamine (TRANSDERM-SCOP) 1 MG/3DAYS Place 1 patch (1.5 mg total) onto the skin every 3 (three) days as needed. (Patient not taking: Reported on 04/28/2022) 8 patch 0   zaleplon (SONATA) 5 MG capsule Take 1 capsule (5 mg total) by mouth at bedtime as needed for sleep. (Patient not taking: Reported on 04/28/2022) 30 capsule 0   No facility-administered medications prior to visit.    ROS: Review of Systems  Constitutional:  Negative for activity change, appetite change, chills, fatigue and unexpected weight change.  HENT:  Negative for congestion, mouth sores and sinus pressure.   Eyes:  Negative for visual disturbance.  Respiratory:  Negative for cough and chest tightness.   Gastrointestinal:  Negative for abdominal pain and nausea.  Genitourinary:  Negative for difficulty urinating, frequency and vaginal pain.  Musculoskeletal:  Negative for back pain and gait problem.  Skin:  Negative for pallor and rash.  Neurological:  Negative for dizziness, tremors, weakness, numbness and headaches.  Psychiatric/Behavioral:  Negative for confusion, sleep disturbance and suicidal ideas. The patient is nervous/anxious.     Objective:  BP 122/60 (BP Location:  Left Arm, Patient Position: Sitting, Cuff Size: Large)   Pulse 67   Temp 98.6 F (37 C) (Oral)   Ht 5\' 5"  (1.651 m)   Wt 146 lb (66.2 kg)   SpO2 94%   BMI 24.30 kg/m   BP Readings from Last 3 Encounters:  11/05/22 122/60  04/28/22 116/64  02/05/22 110/66    Wt Readings from Last 3 Encounters:  11/05/22 146 lb (66.2 kg)  04/28/22 151 lb 4 oz (68.6 kg)  02/05/22 152 lb 8 oz (69.2 kg)    Physical Exam Constitutional:      General: She is not in acute distress.    Appearance: Normal  appearance. She is well-developed.  HENT:     Head: Normocephalic.     Right Ear: External ear normal.     Left Ear: External ear normal.     Nose: Nose normal.  Eyes:     General:        Right eye: No discharge.        Left eye: No discharge.     Conjunctiva/sclera: Conjunctivae normal.     Pupils: Pupils are equal, round, and reactive to light.  Neck:     Thyroid: No thyromegaly.     Vascular: No JVD.     Trachea: No tracheal deviation.  Cardiovascular:     Rate and Rhythm: Normal rate and regular rhythm.     Heart sounds: Normal heart sounds.  Pulmonary:     Effort: No respiratory distress.     Breath sounds: No stridor. No wheezing.  Abdominal:     General: Bowel sounds are normal. There is no distension.     Palpations: Abdomen is soft. There is no mass.     Tenderness: There is no abdominal tenderness. There is no guarding or rebound.  Musculoskeletal:        General: No tenderness.     Cervical back: Normal range of motion and neck supple. No rigidity.  Lymphadenopathy:     Cervical: No cervical adenopathy.  Skin:    Findings: No erythema or rash.  Neurological:     Cranial Nerves: No cranial nerve deficit.     Motor: No abnormal muscle tone.     Coordination: Coordination normal.     Deep Tendon Reflexes: Reflexes normal.  Psychiatric:        Behavior: Behavior normal.        Thought Content: Thought content normal.        Judgment: Judgment normal.     Lab Results  Component Value Date   WBC 5.0 11/06/2022   HGB 13.2 11/06/2022   HCT 39.6 11/06/2022   PLT 243.0 11/06/2022   GLUCOSE 103 (H) 11/06/2022   CHOL 228 (H) 11/06/2022   TRIG 46.0 11/06/2022   HDL 68.70 11/06/2022   LDLCALC 150 (H) 11/06/2022   ALT 26 11/06/2022   AST 28 11/06/2022   NA 138 11/06/2022   K 4.6 11/06/2022   CL 102 11/06/2022   CREATININE 0.90 11/06/2022   BUN 22 11/06/2022   CO2 30 11/06/2022   TSH 2.45 11/06/2022   HGBA1C 5.5 09/27/2014    DG ESOPHAGUS W DOUBLE CM  (HD)  Result Date: 05/05/2022 CLINICAL DATA:  Patient with prior history of globus sensation with liquids in upper chest/ lower neck region, but currently asymptomatic; also with reported history of gastroesophageal reflux EXAM: ESOPHAGUS/BARIUM SWALLOW/TABLET STUDY TECHNIQUE: Combined double and single contrast examination was performed using effervescent crystals, high-density barium, and thin liquid  barium. This exam was performed by Artemio Aly and was supervised and interpreted by Dr. Bary Richard FLUOROSCOPY: Radiation Exposure Index (as provided by the fluoroscopic device): 13.50 mGy Kerma COMPARISON:  None Available. FINDINGS: Swallowing: Appears normal. No vestibular penetration or aspiration seen. Pharynx: Unremarkable. Esophagus: Normal appearance. Esophageal motility: Within normal limits. Hiatal Hernia: Probable small hiatal hernia Gastroesophageal reflux: None elicited despite provocative maneuvers Ingested 13mm barium tablet: Passed normally Other: None. IMPRESSION: 1. Probable small hiatal hernia. 2. Otherwise normal esophagram. No esophageal obstruction or evidence of esophageal dysmotility. No gastroesophageal reflux was visualized during today's exam despite Valsalva maneuvers. Electronically Signed   By: Bary Richard M.D.   On: 05/05/2022 10:29    Assessment & Plan:   Problem List Items Addressed This Visit     Well adult exam - Primary     We discussed age appropriate health related issues, including available/recomended screening tests and vaccinations. Labs were ordered to be later reviewed . All questions were answered. We discussed one or more of the following - seat belt use, use of sunscreen/sun exposure exercise, seat belt use, a need for adhering to healthy diet and exercise. Labs were discussed. All questions were answered. Colonoscopy - 2017 GYN, mammogram, eye exam dental care-on schedule       Relevant Orders   TSH (Completed)   Urinalysis   CBC with  Differential/Platelet (Completed)   Lipid panel (Completed)   Comprehensive metabolic panel (Completed)   Stress    The patient is managing her stress well overall.  Psychology referral was offered         No orders of the defined types were placed in this encounter.     Follow-up: Return in about 1 year (around 11/05/2023) for Wellness Exam.  Sonda Primes, MD

## 2022-11-06 ENCOUNTER — Other Ambulatory Visit (INDEPENDENT_AMBULATORY_CARE_PROVIDER_SITE_OTHER): Payer: PPO

## 2022-11-06 DIAGNOSIS — Z0000 Encounter for general adult medical examination without abnormal findings: Secondary | ICD-10-CM | POA: Diagnosis not present

## 2022-11-06 LAB — COMPREHENSIVE METABOLIC PANEL
ALT: 26 U/L (ref 0–35)
AST: 28 U/L (ref 0–37)
Albumin: 4.3 g/dL (ref 3.5–5.2)
Alkaline Phosphatase: 86 U/L (ref 39–117)
BUN: 22 mg/dL (ref 6–23)
CO2: 30 mEq/L (ref 19–32)
Calcium: 9.9 mg/dL (ref 8.4–10.5)
Chloride: 102 mEq/L (ref 96–112)
Creatinine, Ser: 0.9 mg/dL (ref 0.40–1.20)
GFR: 65.03 mL/min (ref >60.00)
Glucose, Bld: 103 mg/dL — ABNORMAL HIGH (ref 70–99)
Potassium: 4.6 mEq/L (ref 3.5–5.1)
Sodium: 138 mEq/L (ref 135–145)
Total Bilirubin: 0.4 mg/dL (ref 0.2–1.2)
Total Protein: 6.9 g/dL (ref 6.0–8.3)

## 2022-11-06 LAB — URINALYSIS, ROUTINE W REFLEX MICROSCOPIC
Bilirubin Urine: NEGATIVE
Hgb urine dipstick: NEGATIVE
Ketones, ur: NEGATIVE
Nitrite: NEGATIVE
Specific Gravity, Urine: 1.015 (ref 1.000–1.030)
Total Protein, Urine: NEGATIVE
Urine Glucose: NEGATIVE
Urobilinogen, UA: 0.2 (ref 0.0–1.0)
pH: 7 (ref 5.0–8.0)

## 2022-11-06 LAB — LIPID PANEL
Cholesterol: 228 mg/dL — ABNORMAL HIGH (ref 0–200)
HDL: 68.7 mg/dL (ref >39.00)
LDL Cholesterol: 150 mg/dL — ABNORMAL HIGH (ref 0–99)
NonHDL: 158.97
Total CHOL/HDL Ratio: 3
Triglycerides: 46 mg/dL (ref 0.0–149.0)
VLDL: 9.2 mg/dL (ref 0.0–40.0)

## 2022-11-06 LAB — CBC WITH DIFFERENTIAL/PLATELET
Basophils Absolute: 0.1 10*3/uL (ref 0.0–0.1)
Basophils Relative: 1.1 % (ref 0.0–3.0)
Eosinophils Absolute: 0.2 10*3/uL (ref 0.0–0.7)
Eosinophils Relative: 3.9 % (ref 0.0–5.0)
HCT: 39.6 % (ref 36.0–46.0)
Hemoglobin: 13.2 g/dL (ref 12.0–15.0)
Lymphocytes Relative: 49.1 % — ABNORMAL HIGH (ref 12.0–46.0)
Lymphs Abs: 2.5 10*3/uL (ref 0.7–4.0)
MCHC: 33.3 g/dL (ref 30.0–36.0)
MCV: 97 fl (ref 78.0–100.0)
Monocytes Absolute: 0.3 10*3/uL (ref 0.1–1.0)
Monocytes Relative: 6.5 % (ref 3.0–12.0)
Neutro Abs: 2 10*3/uL (ref 1.4–7.7)
Neutrophils Relative %: 39.4 % — ABNORMAL LOW (ref 43.0–77.0)
Platelets: 243 10*3/uL (ref 150.0–400.0)
RBC: 4.09 Mil/uL (ref 3.87–5.11)
RDW: 12.4 % (ref 11.5–15.5)
WBC: 5 10*3/uL (ref 4.0–10.5)

## 2022-11-06 LAB — TSH: TSH: 2.45 u[IU]/mL (ref 0.35–5.50)

## 2022-11-09 NOTE — Assessment & Plan Note (Signed)
  We discussed age appropriate health related issues, including available/recomended screening tests and vaccinations. Labs were ordered to be later reviewed . All questions were answered. We discussed one or more of the following - seat belt use, use of sunscreen/sun exposure exercise, seat belt use, a need for adhering to healthy diet and exercise. Labs were discussed. All questions were answered. Colonoscopy - 2017 GYN, mammogram, eye exam dental care-on schedule

## 2022-11-09 NOTE — Assessment & Plan Note (Signed)
No relapse 

## 2022-11-09 NOTE — Assessment & Plan Note (Signed)
The patient is managing her stress well overall.  Psychology referral was offered

## 2022-11-10 ENCOUNTER — Encounter: Payer: Self-pay | Admitting: Internal Medicine

## 2022-11-12 ENCOUNTER — Other Ambulatory Visit: Payer: Self-pay | Admitting: Internal Medicine

## 2022-11-12 DIAGNOSIS — H401211 Low-tension glaucoma, right eye, mild stage: Secondary | ICD-10-CM | POA: Diagnosis not present

## 2022-11-12 DIAGNOSIS — H401222 Low-tension glaucoma, left eye, moderate stage: Secondary | ICD-10-CM | POA: Diagnosis not present

## 2022-11-12 DIAGNOSIS — Z83511 Family history of glaucoma: Secondary | ICD-10-CM | POA: Diagnosis not present

## 2022-11-13 NOTE — Telephone Encounter (Signed)
Patient called and said she wanted to go over lab results further. She had some concerns about her LDL and her urine test abnormality. She would like a call back at [phone].

## 2022-11-13 NOTE — Telephone Encounter (Signed)
Called pt no answer LMOM RTC../lb 

## 2022-11-24 ENCOUNTER — Other Ambulatory Visit: Payer: Self-pay | Admitting: Internal Medicine

## 2022-11-24 DIAGNOSIS — I10 Essential (primary) hypertension: Secondary | ICD-10-CM

## 2022-11-24 DIAGNOSIS — I471 Supraventricular tachycardia, unspecified: Secondary | ICD-10-CM

## 2022-11-24 DIAGNOSIS — R0789 Other chest pain: Secondary | ICD-10-CM

## 2022-12-29 ENCOUNTER — Encounter: Payer: Self-pay | Admitting: Cardiology

## 2022-12-29 ENCOUNTER — Ambulatory Visit: Payer: PPO | Admitting: Cardiology

## 2022-12-29 VITALS — BP 115/62 | HR 74 | Resp 16 | Ht 65.0 in | Wt 150.6 lb

## 2022-12-29 DIAGNOSIS — I447 Left bundle-branch block, unspecified: Secondary | ICD-10-CM

## 2022-12-29 DIAGNOSIS — M8588 Other specified disorders of bone density and structure, other site: Secondary | ICD-10-CM | POA: Diagnosis not present

## 2022-12-29 DIAGNOSIS — I10 Essential (primary) hypertension: Secondary | ICD-10-CM

## 2022-12-29 DIAGNOSIS — Z9889 Other specified postprocedural states: Secondary | ICD-10-CM | POA: Insufficient documentation

## 2022-12-29 DIAGNOSIS — R0989 Other specified symptoms and signs involving the circulatory and respiratory systems: Secondary | ICD-10-CM

## 2022-12-29 DIAGNOSIS — Q231 Congenital insufficiency of aortic valve: Secondary | ICD-10-CM | POA: Diagnosis not present

## 2022-12-29 LAB — HM DEXA SCAN

## 2022-12-29 NOTE — Progress Notes (Signed)
Primary Physician/Referring:  Tresa Garter, MD  Patient ID: Kristin Torres, female    DOB: 1951-03-27, 72 y.o.   MRN: [id number]  Chief Complaint  Patient presents with   SVT   Hypertension   Chest Pain   New Patient (Initial Visit)    Dr. Sonda Primes   HPI:    Kristin Torres  is a 72 y.o. Caucasian female originally from [place], referred to me for evaluation of hypercholesterolemia.  She has history of hypertension, SVT ablation in 2016 and mild hypercholesterolemia.  She wanted to establish cardiac care in view of her age and hypercholesterolemia.  She remains asymptomatic and states that she is fairly active.  Past Medical History:  Diagnosis Date   Cervicalgia    Chronic back pain    Dermatophytosis of nail    GERD (gastroesophageal reflux disease)    History of pneumonia    Hypertension    Leg cramps    MI (myocardial infarction) (HCC)    Nocturia    Personal history of other diseases of circulatory system    PSVT (paroxysmal supraventricular tachycardia)    Sinusitis    SVT (supraventricular tachycardia)    Tubal pregnancy    Tubular adenoma of colon    Past Surgical History:  Procedure Laterality Date   ABLATION OF DYSRHYTHMIC FOCUS  04/25/2014   svt   APPENDECTOMY     BREAST EXCISIONAL BIOPSY Right 2011   benign  right breast   OOPHORECTOMY     POLYPECTOMY     SUPRAVENTRICULAR TACHYCARDIA ABLATION N/A 04/25/2014   Procedure: SUPRAVENTRICULAR TACHYCARDIA ABLATION;  Surgeon: Marinus Maw, MD;  Location: [facility] CATH LAB;  Service: Cardiovascular;  Laterality: N/A;   TUBAL LIGATION     Family History  Problem Relation Age of Onset   Diabetes Mother    Hypertension Mother    Glaucoma Mother    Heart attack Father 27   Glaucoma Brother    Breast cancer Maternal Grandmother    Diabetes Maternal Grandmother    Hypertension Maternal Grandmother    Hypertension Maternal Grandfather    Stomach cancer Paternal Grandmother    Colon cancer Neg Hx     Esophageal cancer Neg Hx     Social History   Tobacco Use   Smoking status: Never   Smokeless tobacco: Never  Substance Use Topics   Alcohol use: Yes    Alcohol/week: 3.0 standard drinks of alcohol    Types: 3 Glasses of wine per week    Comment: 1 per day   Marital Status: Married  ROS  Review of Systems  Cardiovascular:  Negative for chest pain, dyspnea on exertion and leg swelling.   Objective      12/29/2022    9:19 AM 11/05/2022    1:55 PM 04/28/2022   10:42 AM  Vitals with BMI  Height 5\' 5"  5\' 5"  5\' 5"   Weight 150 lbs 10 oz 146 lbs 151 lbs 4 oz  BMI 25.06 24.3 25.17  Systolic 115 122 409  Diastolic 62 60 64  Pulse 74 67 77   Blood pressure 115/62, pulse 74, resp. rate 16, height 5\' 5"  (1.651 m), weight 150 lb 9.6 oz (68.3 kg), SpO2 98%.  Physical Exam Neck:     Vascular: Carotid bruit (left) present. No JVD.  Cardiovascular:     Rate and Rhythm: Normal rate and regular rhythm.     Pulses: Intact distal pulses.     Heart sounds: Murmur heard.  Systolic murmur is present with a grade of 2/6 at the upper right sternal border. Ejection click heard     No gallop.  Pulmonary:     Effort: Pulmonary effort is normal.     Breath sounds: Normal breath sounds.  Abdominal:     General: Bowel sounds are normal.     Palpations: Abdomen is soft.  Musculoskeletal:     Right lower leg: No edema.     Left lower leg: No edema.    Laboratory examination:   Recent Labs    11/06/22 0837  NA 138  K 4.6  CL 102  CO2 30  GLUCOSE 103*  BUN 22  CREATININE 0.90  CALCIUM 9.9    Lab Results  Component Value Date   GLUCOSE 103 (H) 11/06/2022   NA 138 11/06/2022   K 4.6 11/06/2022   CL 102 11/06/2022   CO2 30 11/06/2022   BUN 22 11/06/2022   CREATININE 0.90 11/06/2022   GFRNONAA >60 08/01/2009   CALCIUM 9.9 11/06/2022   PROT 6.9 11/06/2022   ALBUMIN 4.3 11/06/2022   BILITOT 0.4 11/06/2022   ALKPHOS 86 11/06/2022   AST 28 11/06/2022   ALT 26 11/06/2022       Lab Results  Component Value Date   ALT 26 11/06/2022   AST 28 11/06/2022   ALKPHOS 86 11/06/2022   BILITOT 0.4 11/06/2022       Latest Ref Rng & Units 11/06/2022    8:37 AM 10/17/2021    8:24 AM 04/23/2021    8:54 AM  CBC  WBC 4.0 - 10.5 K/uL 5.0  5.3  5.8   Hemoglobin 12.0 - 15.0 g/dL 40.9  81.1  91.4   Hematocrit 36.0 - 46.0 % 39.6  38.6  38.6   Platelets 150.0 - 400.0 K/uL 243.0  177.0  228.0        Latest Ref Rng & Units 11/06/2022    8:37 AM 10/17/2021    8:24 AM 04/23/2021    8:54 AM  Hepatic Function  Total Protein 6.0 - 8.3 g/dL 6.9  7.4  7.2   Albumin 3.5 - 5.2 g/dL 4.3  4.4  4.1   AST 0 - 37 U/L 28  44  24   ALT 0 - 35 U/L 26  47  26   Alk Phosphatase 39 - 117 U/L 86  85  74   Total Bilirubin 0.2 - 1.2 mg/dL 0.4  0.4  0.5     Lipid Panel Recent Labs    11/06/22 0837  CHOL 228*  TRIG 46.0  LDLCALC 150*  VLDL 9.2  HDL 68.70  CHOLHDL 3    HEMOGLOBIN A1C Lab Results  Component Value Date   HGBA1C 5.5 09/27/2014   TSH Recent Labs    11/06/22 0837  TSH 2.45    Radiology:    Cardiac Studies:   Cardiac MR 03/07/2014: Aortic valve is bicuspid in morphology with fused left and right coronary cusp.  Trivial aortic valve stenosis. Trivial mitral and tricuspid regurgitation. Delayed enhancement imaging demonstrated no evidence of myocardial infarction, scar or infiltrative disease.  EKG:   EKG 12/29/2022: Normal sinus rhythm at rate of 67 bpm, left bundle branch block.  No further analysis.  Compared to 05/03/2015, left bundle branch block is new.   Medications and allergies   Allergies  Allergen Reactions   Bee Venom Anaphylaxis   Streptomycin Rash   Timolol Other (See Comments)    Severe eye irritation and eye swelling  Mango Flavor Swelling   Caffeine Palpitations   Other Rash   Penicillins Rash    Medication list   Current Outpatient Medications:    ALPHAGAN P 0.1 % SOLN, Apply to eye., Disp: , Rfl:    ASPIRIN 81 PO, Take by mouth.,  Disp: , Rfl:    Cholecalciferol (VITAMIN D3) 50 MCG (2000 UT) capsule, Take 1 capsule (2,000 Units total) by mouth daily., Disp: 100 capsule, Rfl: 3   clobetasol ointment (TEMOVATE) 0.05 %, Apply 1 application topically 2 (two) times daily., Disp: 30 g, Rfl: 0   diltiazem (CARDIZEM CD) 180 MG 24 hr capsule, TAKE 1 CAPSULE BY MOUTH EVERY DAY, Disp: 90 capsule, Rfl: 3   docusate sodium (COLACE) 100 MG capsule, Take 1 capsule (100 mg total) by mouth 2 (two) times daily., Disp: 10 capsule, Rfl: 0   EPINEPHrine 0.3 mg/0.3 mL IJ SOAJ injection, Inject 0.3 mg into the muscle as needed for anaphylaxis., Disp: 2 each, Rfl: 3   escitalopram (LEXAPRO) 5 MG tablet, Take 1 tablet (5 mg total) by mouth at bedtime., Disp: 30 tablet, Rfl: 5   estradiol (ESTRACE) 0.1 MG/GM vaginal cream, Place vaginally., Disp: , Rfl:    fexofenadine (ALLEGRA) 180 MG tablet, Take 180 mg by mouth daily as needed. allergies, Disp: , Rfl:    latanoprost (XALATAN) 0.005 % ophthalmic solution, , Disp: , Rfl:    LORazepam (ATIVAN) 0.5 MG tablet, Take 1 tablet (0.5 mg total) by mouth 2 (two) times daily as needed for anxiety., Disp: 60 tablet, Rfl: 1   losartan-hydrochlorothiazide (HYZAAR) 50-12.5 MG tablet, TAKE 1 TABLET BY MOUTH EVERY DAY, Disp: 90 tablet, Rfl: 3   meclizine (ANTIVERT) 12.5 MG tablet, TAKE 1 TABLET BY MOUTH 3 TIMES DAILY AS NEEDED FOR DIZZINESS., Disp: 60 tablet, Rfl: 0   nitrofurantoin, macrocrystal-monohydrate, (MACROBID) 100 MG capsule, Take 1 capsule (100 mg total) by mouth 2 (two) times daily., Disp: 10 capsule, Rfl: 0   Omega-3 Fatty Acids (FISH OIL) 1000 MG CPDR, Take by mouth., Disp: , Rfl:    promethazine-dextromethorphan (PROMETHAZINE-DM) 6.25-15 MG/5ML syrup, Take 5 mLs by mouth 4 (four) times daily as needed for cough., Disp: 240 mL, Rfl: 0   scopolamine (TRANSDERM-SCOP) 1 MG/3DAYS, Place 1 patch (1.5 mg total) onto the skin every 3 (three) days as needed., Disp: 8 patch, Rfl: 0   zaleplon (SONATA) 10 MG  capsule, TAKE 1 CAPSULE BY MOUTH EVERY DAY AT BEDTIME AS NEEDED, Disp: 30 capsule, Rfl: 3  Assessment     ICD-10-CM   1. LBBB (left bundle branch block)  I44.7 EKG 12-Lead    Myocardial Perfusion Imaging    ECHOCARDIOGRAM COMPLETE    2. Primary hypertension  I10     3. Left carotid bruit  R09.89 US Carotid Bilateral    4. Bicuspid aortic valve  Q23.1 ECHOCARDIOGRAM COMPLETE    5. H/O cardiac radiofrequency ablation: SVT 05/02/2014  Z98.890        Orders Placed This Encounter  Procedures   US Carotid Bilateral    Standing Status:   Future    Standing Expiration Date:   03/30/2023    Order Specific Question:   Reason for Exam (SYMPTOM  OR DIAGNOSIS REQUIRED)    Answer:   Left carotid bruit    Order Specific Question:   Preferred imaging location?    Answer:   [facility]   Myocardial Perfusion Imaging    New LBBB    Standing Status:   Future    Standing  Expiration Date:   02/28/2023    Order Specific Question:   Patient weight in lbs    Answer:   150    Order Specific Question:   Where should this be performed?    Answer:   [facility]    Order Specific Question:   Type of stress    Answer:   Lexiscan   EKG 12-Lead   ECHOCARDIOGRAM COMPLETE    Standing Status:   Future    Standing Expiration Date:   03/30/2023    Order Specific Question:   Where should this test be performed    Answer:   [facility])    Order Specific Question:   Does the patient weigh less than or greater than 250 lbs?    Answer:   Patient weighs less than 250 lbs    Order Specific Question:   Perflutren DEFINITY (image enhancing agent) should be administered unless hypersensitivity or allergy exist    Answer:   Administer Perflutren    Order Specific Question:   Reason for exam-Echo    Answer:   Abnormal ECG  R94.31    No orders of the defined types were placed in this encounter.   Medications Discontinued During This Encounter  Medication  Reason   CALCIUM-MAGNESIUM-ZINC PO      Recommendations:   Barbarann Mincy is a 72 y.o. Caucasian female originally from [place], referred to me for evaluation of hypercholesterolemia.  She has history of hypertension, SVT ablation in 2016 and mild hypercholesterolemia.  She wanted to establish cardiac care in view of her age and hypercholesterolemia.  1. LBBB (left bundle branch block) Patient has new onset left bundle branch block, she remains asymptomatic.  In view of her age, hypertension and hypercholesterolemia, I have recommended a nuclear stress test and also an echocardiogram to evaluate wall motion abnormality and LVEF.  - EKG 12-Lead - Myocardial Perfusion Imaging; Future - ECHOCARDIOGRAM COMPLETE; Future  2. Primary hypertension Blood pressure is well-controlled on losartan and diltiazem, I did not make any changes to her medications.  Renal function is normal.  3. Left carotid bruit She has a very prominent left carotid bruit, will obtain carotid artery duplex. - US Carotid Bilateral; Future  4. Bicuspid aortic valve I reviewed her cardiac MR that was done in 2015 at [facility], she does have a systolic ejection murmur and ejection click, will follow-up on the echocardiogram to follow-up on the bicuspid aortic valve. - ECHOCARDIOGRAM COMPLETE; Future  5. H/O cardiac radiofrequency ablation: SVT 05/02/2014 She has not had any recurrence of SVT like symptoms.  I would like to see her back after the above test and I will make recommendations regarding management of hypercholesterolemia which appears to be mild.  If indeed coronary artery duplex reveals significant carotid plaque, obviously she will need to be on high intensity statins as well.  Further recommendations to follow.  I did not place any restrictions on the patient, she can continue to do her physical activity as tolerated.  She is accompanied by her husband and all questions answered.  Other orders - ASPIRIN 81 PO; Take by  mouth. - Omega-3 Fatty Acids (FISH OIL) 1000 MG CPDR; Take by mouth.    Yates Decamp, MD, [facility] 12/29/2022, 10:30 AM Office: [phone]

## 2022-12-31 ENCOUNTER — Other Ambulatory Visit: Payer: Self-pay

## 2022-12-31 ENCOUNTER — Other Ambulatory Visit (HOSPITAL_COMMUNITY): Payer: Self-pay

## 2022-12-31 ENCOUNTER — Other Ambulatory Visit: Payer: Self-pay | Admitting: Cardiology

## 2022-12-31 ENCOUNTER — Encounter (HOSPITAL_COMMUNITY): Payer: Self-pay

## 2022-12-31 ENCOUNTER — Telehealth: Payer: Self-pay | Admitting: Internal Medicine

## 2022-12-31 DIAGNOSIS — R0989 Other specified symptoms and signs involving the circulatory and respiratory systems: Secondary | ICD-10-CM

## 2022-12-31 DIAGNOSIS — I447 Left bundle-branch block, unspecified: Secondary | ICD-10-CM

## 2022-12-31 MED ORDER — ALPHAGAN P 0.1 % OP SOLN
1.0000 [drp] | Freq: Three times a day (TID) | OPHTHALMIC | 3 refills | Status: DC
Start: 2022-12-31 — End: 2023-07-07
  Filled 2022-12-31: qty 30, 100d supply, fill #0
  Filled 2023-04-09 (×2): qty 30, 100d supply, fill #1
  Filled 2023-04-12: qty 15, 38d supply, fill #1
  Filled 2023-04-12: qty 30, 100d supply, fill #1
  Filled 2023-04-12: qty 5, 17d supply, fill #1
  Filled 2023-04-19: qty 30, 100d supply, fill #1
  Filled 2023-06-30: qty 5, 30d supply, fill #2
  Filled 2023-07-06: qty 30, 100d supply, fill #2
  Filled 2023-07-06 – 2023-07-07 (×2): qty 5, 30d supply, fill #2

## 2022-12-31 MED ORDER — LATANOPROST 0.005 % OP SOLN
1.0000 [drp] | Freq: Every day | OPHTHALMIC | 3 refills | Status: DC
  Filled 2022-12-31: qty 7.5, 75d supply, fill #0

## 2022-12-31 NOTE — Telephone Encounter (Signed)
Prescription Request  12/31/2022  LOV: 11/05/2022  What is the name of the medication or equipment?  latanoprost (XALATAN) 0.005 % ophthalmic solution  losartan-hydrochlorothiazide (HYZAAR) 50-12.5 MG tablet   Have you contacted your pharmacy to request a refill? No   Which pharmacy would you like this sent to?  [facility] [address], [place] [address] Phone: [phone]  Fax: [phone] DEA #: [id number]   Patient notified that their request is being sent to the clinical staff for review and that they should receive a response within 2 business days.   Please advise at Mobile [phone] (mobile)

## 2023-01-01 ENCOUNTER — Other Ambulatory Visit: Payer: Self-pay

## 2023-01-01 MED ORDER — LATANOPROST 0.005 % OP SOLN: 1.0000 [drp] | Freq: Every day | OPHTHALMIC | 3 refills | Status: AC

## 2023-01-01 MED ORDER — LOSARTAN POTASSIUM-HCTZ 50-12.5 MG PO TABS: 1.0000 | ORAL_TABLET | Freq: Every day | ORAL | 3 refills | Status: DC

## 2023-01-04 ENCOUNTER — Other Ambulatory Visit: Payer: Self-pay

## 2023-01-05 ENCOUNTER — Other Ambulatory Visit: Payer: Self-pay

## 2023-01-05 ENCOUNTER — Encounter: Payer: Self-pay | Admitting: Pharmacist

## 2023-01-06 ENCOUNTER — Other Ambulatory Visit (HOSPITAL_COMMUNITY): Payer: Self-pay

## 2023-01-06 ENCOUNTER — Ambulatory Visit (HOSPITAL_BASED_OUTPATIENT_CLINIC_OR_DEPARTMENT_OTHER)
Admission: RE | Admit: 2023-01-06 | Discharge: 2023-01-06 | Disposition: A | Discharge status: Home / Self Care | Payer: PPO | Source: Ambulatory Visit | Attending: Cardiology | Admitting: Cardiology

## 2023-01-06 DIAGNOSIS — R0989 Other specified symptoms and signs involving the circulatory and respiratory systems: Secondary | ICD-10-CM | POA: Diagnosis not present

## 2023-01-06 DIAGNOSIS — I6523 Occlusion and stenosis of bilateral carotid arteries: Secondary | ICD-10-CM | POA: Diagnosis not present

## 2023-01-08 NOTE — Progress Notes (Signed)
Carotid artery duplex 01/08/23: Color duplex indicates minimal heterogeneous and calcified plaque, with no hemodynamically significant stenosis by duplex criteria in the extracranial cerebrovascular circulation. Antegrade bilateral vertebral flow.

## 2023-01-11 ENCOUNTER — Telehealth: Payer: Self-pay | Admitting: Neurology

## 2023-01-11 NOTE — Telephone Encounter (Signed)
Pt has been scheduled with Dr Vickey Huger on 03/11/2023 to restart sleep study process.

## 2023-01-11 NOTE — Telephone Encounter (Signed)
Pt is asking for a call to discuss starting the sleep study process over so that she can get back on a machine for sleep.

## 2023-01-11 NOTE — Telephone Encounter (Signed)
Left message for pt to return call.

## 2023-01-11 NOTE — Telephone Encounter (Signed)
Patient returned call about starting the process over to get the CPAP.

## 2023-01-12 ENCOUNTER — Encounter: Payer: Self-pay | Admitting: Internal Medicine

## 2023-01-14 ENCOUNTER — Other Ambulatory Visit: Payer: Self-pay | Admitting: Internal Medicine

## 2023-01-14 MED ORDER — HYDROXYZINE PAMOATE 50 MG PO CAPS
50.0000 mg | ORAL_CAPSULE | Freq: Every evening | ORAL | 5 refills | Status: DC | PRN
Start: 2023-01-14 — End: 2023-07-06

## 2023-01-18 DIAGNOSIS — Z124 Encounter for screening for malignant neoplasm of cervix: Secondary | ICD-10-CM | POA: Diagnosis not present

## 2023-01-18 DIAGNOSIS — G4700 Insomnia, unspecified: Secondary | ICD-10-CM | POA: Diagnosis not present

## 2023-01-18 DIAGNOSIS — F32A Depression, unspecified: Secondary | ICD-10-CM | POA: Diagnosis not present

## 2023-01-18 DIAGNOSIS — Z6825 Body mass index (BMI) 25.0-25.9, adult: Secondary | ICD-10-CM | POA: Diagnosis not present

## 2023-01-18 DIAGNOSIS — N951 Menopausal and female climacteric states: Secondary | ICD-10-CM | POA: Diagnosis not present

## 2023-01-18 DIAGNOSIS — Z01411 Encounter for gynecological examination (general) (routine) with abnormal findings: Secondary | ICD-10-CM | POA: Diagnosis not present

## 2023-01-18 DIAGNOSIS — Z01419 Encounter for gynecological examination (general) (routine) without abnormal findings: Secondary | ICD-10-CM | POA: Diagnosis not present

## 2023-01-26 ENCOUNTER — Encounter (HOSPITAL_COMMUNITY): Payer: PPO

## 2023-01-26 ENCOUNTER — Other Ambulatory Visit (HOSPITAL_COMMUNITY): Payer: PPO

## 2023-01-28 ENCOUNTER — Telehealth (HOSPITAL_COMMUNITY): Payer: Self-pay

## 2023-01-28 NOTE — Telephone Encounter (Signed)
Spoke with the patient, detailed instructions given. She stated she would be here for her test. Asked to call back with any questions. S.Any Mcneice CCT

## 2023-02-02 ENCOUNTER — Ambulatory Visit (HOSPITAL_COMMUNITY): Payer: PPO | Attending: Cardiovascular Disease

## 2023-02-02 ENCOUNTER — Ambulatory Visit (HOSPITAL_BASED_OUTPATIENT_CLINIC_OR_DEPARTMENT_OTHER): Payer: PPO

## 2023-02-02 DIAGNOSIS — I447 Left bundle-branch block, unspecified: Secondary | ICD-10-CM | POA: Insufficient documentation

## 2023-02-02 DIAGNOSIS — Q2381 Bicuspid aortic valve: Secondary | ICD-10-CM | POA: Insufficient documentation

## 2023-02-02 LAB — ECHOCARDIOGRAM COMPLETE
AR max vel: 1.25 cm2
AV Area VTI: 1.25 cm2
AV Area mean vel: 1.4 cm2
AV Mean grad: 10 mm[Hg]
AV Peak grad: 18.5 mm[Hg]
Ao pk vel: 2.15 m/s
Area-P 1/2: 2.8 cm2
S' Lateral: 2.5 cm

## 2023-02-02 MED ORDER — TECHNETIUM TC 99M TETROFOSMIN IV KIT
10.5000 | PACK | Freq: Once | INTRAVENOUS | Status: AC | PRN
  Administered 2023-02-02: 10.5 via INTRAVENOUS

## 2023-02-03 ENCOUNTER — Ambulatory Visit (HOSPITAL_COMMUNITY): Payer: PPO | Attending: Cardiovascular Disease

## 2023-02-03 DIAGNOSIS — I447 Left bundle-branch block, unspecified: Secondary | ICD-10-CM | POA: Insufficient documentation

## 2023-02-03 LAB — MYOCARDIAL PERFUSION IMAGING
LV dias vol: 57 mL (ref 46–106)
LV sys vol: 13 mL
Nuc Stress EF: 77 %
Peak HR: 96 {beats}/min
Rest HR: 70 {beats}/min
Rest Nuclear Isotope Dose: 10.5 mCi
SDS: 1
SRS: 0
SSS: 1
ST Depression (mm): 0.5 mm
Stress Nuclear Isotope Dose: 32 mCi
TID: 1.23

## 2023-02-03 MED ORDER — REGADENOSON 0.4 MG/5ML IV SOLN
0.4000 mg | Freq: Once | INTRAVENOUS | Status: AC
  Administered 2023-02-03: 0.4 mg via INTRAVENOUS

## 2023-02-03 MED ORDER — TECHNETIUM TC 99M TETROFOSMIN IV KIT
32.0000 | PACK | Freq: Once | INTRAVENOUS | Status: AC | PRN
  Administered 2023-02-03: 32 via INTRAVENOUS

## 2023-02-05 NOTE — Progress Notes (Signed)
Regadenoson  Nuclear stress test 02/05/2023:    The study is normal. The study is low risk.   0.5 mm of down sloping ST depression (II, III, aVF, V4 and V5) was noted.   Left ventricular function is normal. Nuclear stress EF: 77%. The left ventricular ejection fraction is hyperdynamic (>65%). End diastolic cavity size is normal.

## 2023-02-05 NOTE — Progress Notes (Signed)
Echocardiogram 02/02/2023:   1. Left ventricular ejection fraction, by estimation, is 60 to 65%. Left ventricular ejection fraction by PLAX is 64 %. The left ventricle has normal function. The left ventricle has no regional wall motion abnormalities. Left ventricular diastolic parameters are consistent with Grade I diastolic dysfunction (impaired relaxation). Elevated left ventricular end-diastolic pressure.  2. Right ventricular systolic function is normal. The right ventricular size is normal. There is normal pulmonary artery systolic pressure.  3. The mitral valve is normal in structure. No evidence of mitral valve regurgitation. No evidence of mitral stenosis.  4. The aortic valve is tricuspid. There is mild calcification of the aortic valve. There is mild thickening of the aortic valve. Aortic valve regurgitation is trivial. Aortic valve sclerosis/calcification is present, without any evidence of aortic stenosis. Aortic valve area, by VTI measures 1.25 cm. Aortic valve mean gradient measures 10.0 mmHg. Aortic valve Vmax measures 2.15 m/s.  5. The inferior vena cava is normal in size with greater than 50% respiratory variability, suggesting right atrial pressure of 3 mmHg

## 2023-02-24 DIAGNOSIS — F4322 Adjustment disorder with anxiety: Secondary | ICD-10-CM | POA: Diagnosis not present

## 2023-03-08 ENCOUNTER — Encounter: Payer: Self-pay | Admitting: Cardiology

## 2023-03-08 ENCOUNTER — Other Ambulatory Visit: Payer: Self-pay | Admitting: *Deleted

## 2023-03-08 ENCOUNTER — Ambulatory Visit: Payer: PPO | Attending: Cardiology | Admitting: Cardiology

## 2023-03-08 VITALS — BP 118/64 | HR 66 | Resp 16 | Ht 65.0 in | Wt 150.6 lb

## 2023-03-08 DIAGNOSIS — I6523 Occlusion and stenosis of bilateral carotid arteries: Secondary | ICD-10-CM

## 2023-03-08 DIAGNOSIS — I447 Left bundle-branch block, unspecified: Secondary | ICD-10-CM | POA: Diagnosis not present

## 2023-03-08 DIAGNOSIS — I350 Nonrheumatic aortic (valve) stenosis: Secondary | ICD-10-CM

## 2023-03-08 DIAGNOSIS — I10 Essential (primary) hypertension: Secondary | ICD-10-CM | POA: Diagnosis not present

## 2023-03-08 DIAGNOSIS — E7800 Pure hypercholesterolemia, unspecified: Secondary | ICD-10-CM

## 2023-03-08 MED ORDER — ATORVASTATIN CALCIUM 20 MG PO TABS
20.0000 mg | ORAL_TABLET | Freq: Every day | ORAL | 3 refills | Status: DC
Start: 2023-03-08 — End: 2023-12-08

## 2023-03-08 NOTE — Patient Instructions (Signed)
Medication Instructions:  Your physician has recommended you make the following change in your medication: Start Atorvastatin 20 mg by mouth daily   *If you need a refill on your cardiac medications before your next appointment, please call your pharmacy*   Lab Work: Your physician recommends that you return for lab work in: 2 months.  Lipid profile.  This will be fasting.  The lab in our office is open from 7:30 AM to 4:00 PM Monday through Friday.  It is closed from 12:30-1:30.  You can also have lab work done at any [facility] location  If you have labs (blood work) drawn today and your tests are completely normal, you will receive your results only by: MyChart Message (if you have MyChart) OR A paper copy in the mail If you have any lab test that is abnormal or we need to change your treatment, we will call you to review the results.   Testing/Procedures: none   Follow-Up: At [facility], you and your health needs are our priority.  As part of our continuing mission to provide you with exceptional heart care, we have created designated Provider Care Teams.  These Care Teams include your primary Cardiologist (physician) and Advanced Practice Providers (APPs -  Physician Assistants and Nurse Practitioners) who all work together to provide you with the care you need, when you need it.  We recommend signing up for the patient portal called "MyChart".  Sign up information is provided on this After Visit Summary.  MyChart is used to connect with patients for Virtual Visits (Telemedicine).  Patients are able to view lab/test results, encounter notes, upcoming appointments, etc.  Non-urgent messages can be sent to your provider as well.   To learn more about what you can do with MyChart, go to [email or web address].    Your next appointment:   12 month(s)  Provider:   Yates Decamp, MD     Other Instructions

## 2023-03-08 NOTE — Progress Notes (Signed)
Cardiology Office Note:  .   Date:  03/08/2023  ID:  Kristin Torres, DOB [birth year], MRN [id number] PCP: Tresa Garter, MD  [facility] Providers Cardiologist:  Yates Decamp, MD    History of Present Illness: .   Kristin Torres is a 72 y.o. Caucasian female originally from [place], referred to me for evaluation of hypercholesterolemia. She has history of hypertension, SVT ablation in 2016 and mild hypercholesterolemia. She remains asymptomatic and states that she is fairly active.    She established with me 2 months ago specifically for management of hypercholesterolemia.  She was found to have new onset LBBB and underwent stress test, echocardiogram and carotid artery duplex for carotid bruit and presents for follow-up.  Discussed the use of AI scribe software for clinical note transcription with the patient, who gave verbal consent to proceed.  History of Present Illness   Kristin Torres, a patient with a history of high cholesterol and supraventricular tachycardia (SVT) post-ablation, presents for a follow-up visit. She reports no current symptoms such as chest pain, shortness of breath, heart racing symptoms, dizziness, or passing out spells. However, during her last visit, new EKG changes were noted and a carotid bruit was heard on examination. This prompted further investigation with several tests. Kristin Torres is active and reports no limitations in her daily activities. She is currently not on any cholesterol medication.       Review of Systems  Cardiovascular:  Negative for chest pain, dyspnea on exertion and leg swelling.    Risk Assessment/Calculations:              Lab Results  Component Value Date   CHOL 228 (H) 11/06/2022   HDL 68.70 11/06/2022   LDLCALC 150 (H) 11/06/2022   TRIG 46.0 11/06/2022   CHOLHDL 3 11/06/2022   Lab Results  Component Value Date   NA 138 11/06/2022   K 4.6 11/06/2022   CO2 30 11/06/2022   GLUCOSE 103 (H) 11/06/2022   BUN 22 11/06/2022    CREATININE 0.90 11/06/2022   CALCIUM 9.9 11/06/2022   GFR 65.03 11/06/2022   GFRNONAA >60 08/01/2009      Latest Ref Rng & Units 11/06/2022    8:37 AM 10/17/2021    8:24 AM 04/23/2021    8:54 AM  BMP  Glucose 70 - 99 mg/dL 409  811  914   BUN 6 - 23 mg/dL 22  23  21    Creatinine 0.40 - 1.20 mg/dL 7.82  9.56  2.13   Sodium 135 - 145 mEq/L 138  138  140   Potassium 3.5 - 5.1 mEq/L 4.6  4.1  4.0   Chloride 96 - 112 mEq/L 102  102  103   CO2 19 - 32 mEq/L 30  29  31    Calcium 8.4 - 10.5 mg/dL 9.9  9.8  9.8        Latest Ref Rng & Units 11/06/2022    8:37 AM 10/17/2021    8:24 AM 04/23/2021    8:54 AM  CBC  WBC 4.0 - 10.5 K/uL 5.0  5.3  5.8   Hemoglobin 12.0 - 15.0 g/dL 08.6  57.8  46.9   Hematocrit 36.0 - 46.0 % 39.6  38.6  38.6   Platelets 150.0 - 400.0 K/uL 243.0  177.0  228.0    Physical Exam:   VS:  BP 118/64 (BP Location: Left Arm, Patient Position: Sitting, Cuff Size: Normal)   Pulse 66   Resp 16   Ht 5'  5" (1.651 m)   Wt 150 lb 9.6 oz (68.3 kg)   SpO2 96%   BMI 25.06 kg/m    Wt Readings from Last 3 Encounters:  03/08/23 150 lb 9.6 oz (68.3 kg)  12/29/22 150 lb 9.6 oz (68.3 kg)  11/05/22 146 lb (66.2 kg)     Physical Exam Neck:     Vascular: Carotid bruit (left) present. No JVD.  Cardiovascular:     Rate and Rhythm: Normal rate and regular rhythm.     Pulses: Intact distal pulses.     Heart sounds: Murmur heard.     Systolic murmur is present with a grade of 2/6 at the upper right sternal border. Ejection click heard     No gallop.  Pulmonary:     Effort: Pulmonary effort is normal.     Breath sounds: Normal breath sounds.  Abdominal:     General: Bowel sounds are normal.     Palpations: Abdomen is soft.  Musculoskeletal:     Right lower leg: No edema.     Left lower leg: No edema.     Studies Reviewed: .    H/O cardiac radiofrequency ablation: SVT 05/02/2014.  Carotid artery duplex 01/08/23: Color duplex indicates minimal heterogeneous and calcified  plaque, with no hemodynamically significant stenosis by duplex criteria in the extracranial cerebrovascular circulation. Antegrade bilateral vertebral flow.  MYOCARDIAL PERFUSION IMAGING 02/03/2023   Narrative   The study is normal. The study is low risk.   0.5 mm of down sloping ST depression (II, III, aVF, V4 and V5) was noted.   Left ventricular function is normal. Nuclear stress EF: 77%. The left ventricular ejection fraction is hyperdynamic (>65%). End diastolic cavity size is normal.  ECHOCARDIOGRAM COMPLETE 02/02/2023  1. Left ventricular ejection fraction, by estimation, is 60 to 65%. Left ventricular ejection fraction by PLAX is 64 %. The left ventricle has normal function. The left ventricle has no regional wall motion abnormalities. Left ventricular diastolic parameters are consistent with Grade I diastolic dysfunction (impaired relaxation). Elevated left ventricular end-diastolic pressure. 2. Right ventricular systolic function is normal. The right ventricular size is normal. There is normal pulmonary artery systolic pressure. 3. The mitral valve is normal in structure. No evidence of mitral valve regurgitation. No evidence of mitral stenosis. 4. The aortic valve is tricuspid. There is mild calcification of the aortic valve. There is mild thickening of the aortic valve. Aortic valve regurgitation is trivial. Aortic valve sclerosis/calcification is present, without any evidence of aortic stenosis. Aortic valve area, by VTI measures 1.25 cm. Aortic valve mean gradient measures 10.0 mmHg. Aortic valve Vmax measures 2.15 m/s. 5. The inferior vena cava is normal in size with greater than 50% respiratory variability, suggesting right atrial pressure of 3 mmHg.  EKG:         EKG 12/29/2022: Normal sinus rhythm at rate of 67 bpm, left bundle branch block. No further analysis. Compared to 05/03/2015, left bundle branch block is new.   Current Meds  Medication Sig   ALPHAGAN P 0.1 %  SOLN Place 1 drop into both eyes 3 (three) times daily.   ASPIRIN 81 PO Take by mouth.   atorvastatin (LIPITOR) 20 MG tablet Take 1 tablet (20 mg total) by mouth daily.   Cholecalciferol (VITAMIN D3) 50 MCG (2000 UT) capsule Take 1 capsule (2,000 Units total) by mouth daily.   diltiazem (CARDIZEM CD) 180 MG 24 hr capsule TAKE 1 CAPSULE BY MOUTH EVERY DAY   latanoprost (XALATAN) 0.005 %  ophthalmic solution Place 1 drop into both eyes at bedtime.   losartan-hydrochlorothiazide (HYZAAR) 50-12.5 MG tablet Take 1 tablet by mouth daily.   Omega-3 Fatty Acids (FISH OIL) 1000 MG CPDR Take by mouth.     ASSESSMENT AND PLAN: .      ICD-10-CM   1. LBBB (left bundle branch block)  I44.7     2. Primary hypertension  I10     3. Atherosclerosis of both carotid arteries  I65.23 atorvastatin (LIPITOR) 20 MG tablet    4. Mild aortic stenosis  I35.0     5. Hypercholesteremia  E78.00 atorvastatin (LIPITOR) 20 MG tablet    Lipid Profile      Assessment and Plan  Left bundle branch block I reviewed results of echocardiogram and nuclear stress test, normal LVEF and no evidence of ischemia.  Hence no further evaluation is indicated.    Mild Aortic Stenosis Identified on echocardiogram, likely causing the previously noted murmur. No symptoms of shortness of breath or chest pain. -Continue monitoring for changes in symptoms or intensity of murmur. -Repeat echocardiogram in 2-3 years unless symptoms change or murmur intensifies.  Mild Diastolic Dysfunction Identified on echocardiogram, likely due to age-related stiffness of the heart muscle. No symptoms of shortness of breath or chest pain. -Encourage regular physical activity, such as uphill walking, to train the heart to relax.  Hyperlipidemia Persistent mildly elevated LDL cholesterol over several years, with minimal plaque in carotid arteries. -Start Atorvastatin 20mg  daily. -Check cholesterol levels in 2-3 months. -Consider adding ezetimibe if  LDL remains high after 2-3 months on Atorvastatin.  Hypertension Well controlled on current medication regimen. -Continue current blood pressure medications.  General Health Maintenance -Continue Aspirin 81mg  daily for cardiovascular protection. -Follow-up in 1 year unless changes in symptoms or cholesterol levels warrant earlier visit.       Signed,  Yates Decamp, MD, [facility] 03/08/2023, 6:08 PM [facility] [address] [place], [place] [address] Phone: [phone]. Fax:  [phone]

## 2023-03-10 ENCOUNTER — Other Ambulatory Visit (HOSPITAL_COMMUNITY): Payer: Self-pay

## 2023-03-11 ENCOUNTER — Ambulatory Visit: Payer: PPO | Admitting: Neurology

## 2023-03-11 ENCOUNTER — Other Ambulatory Visit (HOSPITAL_COMMUNITY): Payer: Self-pay

## 2023-03-11 MED ORDER — ATORVASTATIN CALCIUM 20 MG PO TABS
20.0000 mg | ORAL_TABLET | Freq: Every day | ORAL | 3 refills | Status: DC
  Filled 2023-04-06: qty 90, 90d supply, fill #0

## 2023-03-12 ENCOUNTER — Other Ambulatory Visit (HOSPITAL_COMMUNITY): Payer: Self-pay

## 2023-03-22 DIAGNOSIS — D225 Melanocytic nevi of trunk: Secondary | ICD-10-CM | POA: Diagnosis not present

## 2023-03-22 DIAGNOSIS — L814 Other melanin hyperpigmentation: Secondary | ICD-10-CM | POA: Diagnosis not present

## 2023-03-22 DIAGNOSIS — L821 Other seborrheic keratosis: Secondary | ICD-10-CM | POA: Diagnosis not present

## 2023-03-26 ENCOUNTER — Other Ambulatory Visit (HOSPITAL_COMMUNITY): Payer: Self-pay

## 2023-03-26 MED ORDER — LOSARTAN POTASSIUM-HCTZ 50-12.5 MG PO TABS
1.0000 | ORAL_TABLET | Freq: Every day | ORAL | 3 refills | Status: DC
  Filled 2023-03-26: qty 90, 90d supply, fill #0

## 2023-03-26 MED ORDER — DILTIAZEM HCL ER COATED BEADS 180 MG PO CP24
180.0000 mg | ORAL_CAPSULE | Freq: Every day | ORAL | 3 refills | Status: DC
  Filled 2023-03-26: qty 90, 90d supply, fill #0
  Filled 2023-06-30: qty 90, 90d supply, fill #1
  Filled 2023-10-01 (×2): qty 90, 90d supply, fill #2

## 2023-03-29 ENCOUNTER — Other Ambulatory Visit (HOSPITAL_COMMUNITY): Payer: Self-pay

## 2023-03-29 ENCOUNTER — Encounter: Payer: Self-pay | Admitting: Internal Medicine

## 2023-04-01 DIAGNOSIS — H2513 Age-related nuclear cataract, bilateral: Secondary | ICD-10-CM | POA: Diagnosis not present

## 2023-04-01 DIAGNOSIS — H401122 Primary open-angle glaucoma, left eye, moderate stage: Secondary | ICD-10-CM | POA: Diagnosis not present

## 2023-04-01 DIAGNOSIS — H52223 Regular astigmatism, bilateral: Secondary | ICD-10-CM | POA: Diagnosis not present

## 2023-04-01 DIAGNOSIS — Z83511 Family history of glaucoma: Secondary | ICD-10-CM | POA: Diagnosis not present

## 2023-04-01 DIAGNOSIS — H401111 Primary open-angle glaucoma, right eye, mild stage: Secondary | ICD-10-CM | POA: Diagnosis not present

## 2023-04-01 DIAGNOSIS — H43391 Other vitreous opacities, right eye: Secondary | ICD-10-CM | POA: Diagnosis not present

## 2023-04-01 DIAGNOSIS — H43811 Vitreous degeneration, right eye: Secondary | ICD-10-CM | POA: Diagnosis not present

## 2023-04-01 DIAGNOSIS — H401211 Low-tension glaucoma, right eye, mild stage: Secondary | ICD-10-CM | POA: Diagnosis not present

## 2023-04-01 DIAGNOSIS — H401222 Low-tension glaucoma, left eye, moderate stage: Secondary | ICD-10-CM | POA: Diagnosis not present

## 2023-04-01 DIAGNOSIS — H35432 Paving stone degeneration of retina, left eye: Secondary | ICD-10-CM | POA: Diagnosis not present

## 2023-04-05 ENCOUNTER — Ambulatory Visit (INDEPENDENT_AMBULATORY_CARE_PROVIDER_SITE_OTHER): Payer: PPO | Admitting: Internal Medicine

## 2023-04-05 ENCOUNTER — Encounter: Payer: Self-pay | Admitting: Internal Medicine

## 2023-04-05 VITALS — BP 120/78 | HR 85 | Temp 98.4°F | Ht 65.0 in | Wt 150.0 lb

## 2023-04-05 DIAGNOSIS — R110 Nausea: Secondary | ICD-10-CM | POA: Insufficient documentation

## 2023-04-05 DIAGNOSIS — N12 Tubulo-interstitial nephritis, not specified as acute or chronic: Secondary | ICD-10-CM | POA: Diagnosis not present

## 2023-04-05 LAB — CBC WITH DIFFERENTIAL/PLATELET
Basophils Absolute: 0 10*3/uL (ref 0.0–0.1)
Basophils Relative: 0.3 % (ref 0.0–3.0)
Eosinophils Absolute: 0.6 10*3/uL (ref 0.0–0.7)
Eosinophils Relative: 10.6 % — ABNORMAL HIGH (ref 0.0–5.0)
HCT: 40.8 % (ref 36.0–46.0)
Hemoglobin: 14 g/dL (ref 12.0–15.0)
Lymphocytes Relative: 22.4 % (ref 12.0–46.0)
Lymphs Abs: 1.2 10*3/uL (ref 0.7–4.0)
MCHC: 34.2 g/dL (ref 30.0–36.0)
MCV: 97.3 fL (ref 78.0–100.0)
Monocytes Absolute: 0.4 10*3/uL (ref 0.1–1.0)
Monocytes Relative: 8 % (ref 3.0–12.0)
Neutro Abs: 3.2 10*3/uL (ref 1.4–7.7)
Neutrophils Relative %: 58.7 % (ref 43.0–77.0)
Platelets: 222 10*3/uL (ref 150.0–400.0)
RBC: 4.19 Mil/uL (ref 3.87–5.11)
RDW: 12.9 % (ref 11.5–15.5)
WBC: 5.4 10*3/uL (ref 4.0–10.5)

## 2023-04-05 LAB — COMPREHENSIVE METABOLIC PANEL
ALT: 33 U/L (ref 0–35)
AST: 32 U/L (ref 0–37)
Albumin: 4.1 g/dL (ref 3.5–5.2)
Alkaline Phosphatase: 89 U/L (ref 39–117)
BUN: 24 mg/dL — ABNORMAL HIGH (ref 6–23)
CO2: 29 meq/L (ref 19–32)
Calcium: 9.9 mg/dL (ref 8.4–10.5)
Chloride: 97 meq/L (ref 96–112)
Creatinine, Ser: 1.39 mg/dL — ABNORMAL HIGH (ref 0.40–1.20)
GFR: 38.49 mL/min — ABNORMAL LOW (ref >60.00)
Glucose, Bld: 119 mg/dL — ABNORMAL HIGH (ref 70–99)
Potassium: 3.5 meq/L (ref 3.5–5.1)
Sodium: 137 meq/L (ref 135–145)
Total Bilirubin: 0.5 mg/dL (ref 0.2–1.2)
Total Protein: 7.6 g/dL (ref 6.0–8.3)

## 2023-04-05 MED ORDER — ONDANSETRON HCL 4 MG PO TABS: 4.0000 mg | ORAL_TABLET | Freq: Three times a day (TID) | ORAL | 0 refills | Status: DC | PRN

## 2023-04-05 MED ORDER — CEFUROXIME AXETIL 500 MG PO TABS: 500.0000 mg | ORAL_TABLET | Freq: Two times a day (BID) | ORAL | 0 refills | Status: DC

## 2023-04-05 MED ORDER — CEFTRIAXONE SODIUM 1 G IJ SOLR
1.0000 g | Freq: Once | INTRAMUSCULAR | Status: AC
  Administered 2023-04-05: 1 g via INTRAMUSCULAR

## 2023-04-05 NOTE — Patient Instructions (Signed)
To ER if worse

## 2023-04-05 NOTE — Assessment & Plan Note (Signed)
Zofran po prn

## 2023-04-05 NOTE — Progress Notes (Signed)
Subjective:  Patient ID: Kristin Torres, female    DOB: 03/05/1951  Age: 72 y.o. MRN: [id number]  CC: Urinary Tract Infection (Pt states she has rt sided flank pain x5 days a/w frequency in urination, fever reaching 102-103 range x1 day ago and fatigue )   HPI Kristin Torres presents for urinary tract Infection - rt sided flank pain x5 days a/w frequency in urination, fever reaching 102-103 range x1 day ago and fatigue. Very weak... Chills Started Macrobid on Sat in am  Outpatient Medications Prior to Visit  Medication Sig Dispense Refill   ALPHAGAN P 0.1 % SOLN Place 1 drop into both eyes 3 (three) times daily. 30 mL 3   ASPIRIN 81 PO Take by mouth.     atorvastatin (LIPITOR) 20 MG tablet Take 1 tablet (20 mg total) by mouth daily. 30 tablet 3   atorvastatin (LIPITOR) 20 MG tablet Take 1 tablet (20 mg total) by mouth daily. 90 tablet 3   Cholecalciferol (VITAMIN D3) 50 MCG (2000 UT) capsule Take 1 capsule (2,000 Units total) by mouth daily. 100 capsule 3   diltiazem (CARDIZEM CD) 180 MG 24 hr capsule TAKE 1 CAPSULE BY MOUTH EVERY DAY 90 capsule 3   diltiazem (CARDIZEM CD) 180 MG 24 hr capsule Take 1 capsule (180 mg total) by mouth daily. 90 capsule 3   EPINEPHrine 0.3 mg/0.3 mL IJ SOAJ injection Inject 0.3 mg into the muscle as needed for anaphylaxis. 2 each 3   latanoprost (XALATAN) 0.005 % ophthalmic solution Place 1 drop into both eyes at bedtime. 7.5 mL 3   losartan-hydrochlorothiazide (HYZAAR) 50-12.5 MG tablet Take 1 tablet by mouth daily. 90 tablet 3   meclizine (ANTIVERT) 12.5 MG tablet TAKE 1 TABLET BY MOUTH 3 TIMES DAILY AS NEEDED FOR DIZZINESS. 60 tablet 0   losartan-hydrochlorothiazide (HYZAAR) 50-12.5 MG tablet Take 1 tablet by mouth daily. 90 tablet 3   escitalopram (LEXAPRO) 5 MG tablet Take 1 tablet (5 mg total) by mouth at bedtime. (Patient not taking: Reported on 04/05/2023) 30 tablet 5   estradiol (ESTRACE) 0.1 MG/GM vaginal cream Place vaginally. (Patient not taking:  Reported on 04/05/2023)     fexofenadine (ALLEGRA) 180 MG tablet Take 180 mg by mouth daily as needed. allergies (Patient not taking: Reported on 03/08/2023)     hydrOXYzine (VISTARIL) 50 MG capsule Take 1-2 capsules (50-100 mg total) by mouth at bedtime as needed. (Patient not taking: Reported on 04/05/2023) 60 capsule 5   LORazepam (ATIVAN) 0.5 MG tablet Take 1 tablet (0.5 mg total) by mouth 2 (two) times daily as needed for anxiety. (Patient not taking: Reported on 04/05/2023) 60 tablet 1   Omega-3 Fatty Acids (FISH OIL) 1000 MG CPDR Take by mouth.     clobetasol ointment (TEMOVATE) 0.05 % Apply 1 application topically 2 (two) times daily. (Patient not taking: Reported on 04/05/2023) 30 g 0   docusate sodium (COLACE) 100 MG capsule Take 1 capsule (100 mg total) by mouth 2 (two) times daily. (Patient not taking: Reported on 04/05/2023) 10 capsule 0   nitrofurantoin, macrocrystal-monohydrate, (MACROBID) 100 MG capsule Take 1 capsule (100 mg total) by mouth 2 (two) times daily. (Patient not taking: Reported on 04/05/2023) 10 capsule 0   promethazine-dextromethorphan (PROMETHAZINE-DM) 6.25-15 MG/5ML syrup Take 5 mLs by mouth 4 (four) times daily as needed for cough. (Patient not taking: Reported on 04/05/2023) 240 mL 0   scopolamine (TRANSDERM-SCOP) 1 MG/3DAYS Place 1 patch (1.5 mg total) onto the skin every 3 (three) days as  needed. (Patient not taking: Reported on 04/05/2023) 8 patch 0   No facility-administered medications prior to visit.    ROS: Review of Systems  Constitutional:  Positive for chills, diaphoresis and fatigue. Negative for activity change, appetite change and unexpected weight change.  HENT:  Negative for congestion, mouth sores and sinus pressure.   Eyes:  Negative for visual disturbance.  Respiratory:  Negative for cough and chest tightness.   Gastrointestinal:  Positive for nausea. Negative for abdominal pain and vomiting.  Genitourinary:  Positive for flank pain. Negative  for difficulty urinating, frequency and vaginal pain.  Musculoskeletal:  Positive for arthralgias and back pain. Negative for gait problem.  Skin:  Negative for pallor and rash.  Neurological:  Positive for weakness. Negative for dizziness, tremors, numbness and headaches.  Psychiatric/Behavioral:  Negative for confusion and sleep disturbance.     Objective:  BP 120/78 (BP Location: Left Arm, Patient Position: Sitting, Cuff Size: Normal)   Pulse 85   Temp 98.4 F (36.9 C) (Oral)   Ht 5\' 5"  (1.651 m)   Wt 150 lb (68 kg)   SpO2 96%   BMI 24.96 kg/m   BP Readings from Last 3 Encounters:  04/05/23 120/78  03/08/23 118/64  12/29/22 115/62    Wt Readings from Last 3 Encounters:  04/05/23 150 lb (68 kg)  03/08/23 150 lb 9.6 oz (68.3 kg)  12/29/22 150 lb 9.6 oz (68.3 kg)    Physical Exam Constitutional:      General: She is not in acute distress.    Appearance: She is well-developed. She is obese. She is not toxic-appearing.  HENT:     Head: Normocephalic.     Right Ear: External ear normal.     Left Ear: External ear normal.     Nose: Nose normal.  Eyes:     General:        Right eye: No discharge.        Left eye: No discharge.     Conjunctiva/sclera: Conjunctivae normal.     Pupils: Pupils are equal, round, and reactive to light.  Neck:     Thyroid: No thyromegaly.     Vascular: No JVD.     Trachea: No tracheal deviation.  Cardiovascular:     Rate and Rhythm: Normal rate and regular rhythm.     Heart sounds: Normal heart sounds.  Pulmonary:     Effort: No respiratory distress.     Breath sounds: No stridor. No wheezing.  Abdominal:     General: Bowel sounds are normal. There is no distension.     Palpations: Abdomen is soft. There is no mass.     Tenderness: There is no abdominal tenderness. There is no guarding or rebound.  Musculoskeletal:        General: No tenderness.     Cervical back: Normal range of motion and neck supple. No rigidity.  Lymphadenopathy:      Cervical: No cervical adenopathy.  Skin:    Findings: No erythema or rash.  Neurological:     Cranial Nerves: No cranial nerve deficit.     Motor: No abnormal muscle tone.     Coordination: Coordination normal.     Deep Tendon Reflexes: Reflexes normal.  Psychiatric:        Behavior: Behavior normal.        Thought Content: Thought content normal.        Judgment: Judgment normal.   Looks tired  Lab Results  Component Value Date  WBC 5.0 11/06/2022   HGB 13.2 11/06/2022   HCT 39.6 11/06/2022   PLT 243.0 11/06/2022   GLUCOSE 103 (H) 11/06/2022   CHOL 228 (H) 11/06/2022   TRIG 46.0 11/06/2022   HDL 68.70 11/06/2022   LDLCALC 150 (H) 11/06/2022   ALT 26 11/06/2022   AST 28 11/06/2022   NA 138 11/06/2022   K 4.6 11/06/2022   CL 102 11/06/2022   CREATININE 0.90 11/06/2022   BUN 22 11/06/2022   CO2 30 11/06/2022   TSH 2.45 11/06/2022   HGBA1C 5.5 09/27/2014    US Carotid Bilateral Result Date: 01/08/2023 CLINICAL DATA:  72 year old female with left carotid bruit EXAM: BILATERAL CAROTID DUPLEX ULTRASOUND TECHNIQUE: Wallace Cullens scale imaging, color Doppler and duplex ultrasound were performed of bilateral carotid and vertebral arteries in the neck. COMPARISON:  None Available. FINDINGS: Criteria: Quantification of carotid stenosis is based on velocity parameters that correlate the residual internal carotid diameter with NASCET-based stenosis levels, using the diameter of the distal internal carotid lumen as the denominator for stenosis measurement. The following velocity measurements were obtained: RIGHT ICA:  Systolic 96 cm/sec, Diastolic 36 cm/sec CCA:  82 cm/sec SYSTOLIC ICA/CCA RATIO:  1.2 ECA:  120 cm/sec LEFT ICA:  Systolic 97 cm/sec, Diastolic 35 cm/sec CCA:  99 cm/sec SYSTOLIC ICA/CCA RATIO:  1.0 ECA:  101 cm/sec Right Brachial SBP: 103 Left Brachial SBP: 103 RIGHT CAROTID ARTERY: No significant calcifications of the right common carotid artery. Intermediate waveform maintained.  Heterogeneous and partially calcified plaque at the right carotid bifurcation. No significant lumen shadowing. Low resistance waveform of the right ICA. No significant tortuosity. RIGHT VERTEBRAL ARTERY: Antegrade flow with low resistance waveform. LEFT CAROTID ARTERY: No significant calcifications of the left common carotid artery. Intermediate waveform maintained. Heterogeneous and partially calcified plaque at the left carotid bifurcation without significant lumen shadowing. Low resistance waveform of the left ICA. No significant tortuosity. LEFT VERTEBRAL ARTERY:  Antegrade flow with low resistance waveform. IMPRESSION: Color duplex indicates minimal heterogeneous and calcified plaque, with no hemodynamically significant stenosis by duplex criteria in the extracranial cerebrovascular circulation. Signed, Yvone Neu. Miachel Roux, RPVI [facility] Electronically Signed   By: Gilmer Mor D.O.   On: 01/08/2023 09:28    Assessment & Plan:   Problem List Items Addressed This Visit     Pyelonephritis - Primary   Probable R side T ER if worse Ceftin 500 mg x10 d Rocephin IM now CBC, CMET, UA, Cx Abd [place]      Relevant Medications   cefUROXime (CEFTIN) 500 MG tablet   Other Relevant Orders   CBC with Differential/Platelet   Comprehensive metabolic panel   Urinalysis   CULTURE, URINE COMPREHENSIVE   US Abdomen Complete   Nausea   Zofran po prn       Relevant Orders   CBC with Differential/Platelet   Comprehensive metabolic panel   Urinalysis   CULTURE, URINE COMPREHENSIVE      Meds ordered this encounter  Medications   cefUROXime (CEFTIN) 500 MG tablet    Sig: Take 1 tablet (500 mg total) by mouth 2 (two) times daily with a meal for 10 days.    Dispense:  20 tablet    Refill:  0   ondansetron (ZOFRAN) 4 MG tablet    Sig: Take 1 tablet (4 mg total) by mouth every 8 (eight) hours as needed for nausea or vomiting.     Dispense:  20 tablet    Refill:  0      Follow-up: Return in about 2 weeks (around 04/19/2023) for a follow-up visit.  Sonda Primes, MD

## 2023-04-05 NOTE — Assessment & Plan Note (Addendum)
Probable R side T ER if worse Ceftin 500 mg x10 d Rocephin IM now CBC, CMET, UA, Cx Abd [place]

## 2023-04-05 NOTE — Addendum Note (Signed)
Addended by: Delsa Grana R on: 04/05/2023 04:22 PM   Modules accepted: Orders

## 2023-04-06 ENCOUNTER — Telehealth: Payer: Self-pay | Admitting: Internal Medicine

## 2023-04-06 ENCOUNTER — Other Ambulatory Visit: Payer: Self-pay

## 2023-04-06 LAB — URINALYSIS, ROUTINE W REFLEX MICROSCOPIC
Bilirubin Urine: NEGATIVE
Hgb urine dipstick: NEGATIVE
Ketones, ur: NEGATIVE
Nitrite: NEGATIVE
RBC / HPF: NONE SEEN (ref 0–?)
Specific Gravity, Urine: 1.015 (ref 1.000–1.030)
Total Protein, Urine: NEGATIVE
Urine Glucose: NEGATIVE
Urobilinogen, UA: 1 (ref 0.0–1.0)
pH: 7 (ref 5.0–8.0)

## 2023-04-06 NOTE — Telephone Encounter (Signed)
Pt called wanting to speak with a nurse about a medication that was prescribed to her yesterday during her visit.

## 2023-04-06 NOTE — Telephone Encounter (Signed)
Patient called back and needs someone to call her

## 2023-04-07 ENCOUNTER — Other Ambulatory Visit: Payer: Self-pay | Admitting: Internal Medicine

## 2023-04-07 ENCOUNTER — Encounter: Payer: Self-pay | Admitting: Internal Medicine

## 2023-04-07 DIAGNOSIS — L658 Other specified nonscarring hair loss: Secondary | ICD-10-CM | POA: Diagnosis not present

## 2023-04-07 DIAGNOSIS — L659 Nonscarring hair loss, unspecified: Secondary | ICD-10-CM | POA: Diagnosis not present

## 2023-04-07 DIAGNOSIS — L650 Telogen effluvium: Secondary | ICD-10-CM | POA: Diagnosis not present

## 2023-04-07 LAB — CULTURE, URINE COMPREHENSIVE

## 2023-04-07 MED ORDER — CIPROFLOXACIN HCL 500 MG PO TABS: 500.0000 mg | ORAL_TABLET | Freq: Two times a day (BID) | ORAL | 0 refills | Status: DC

## 2023-04-07 NOTE — Telephone Encounter (Signed)
Provider has communicated with pt via MyChart about her concerns.

## 2023-04-08 ENCOUNTER — Encounter: Payer: Self-pay | Admitting: Internal Medicine

## 2023-04-08 ENCOUNTER — Ambulatory Visit
Admission: RE | Admit: 2023-04-08 | Discharge: 2023-04-08 | Disposition: A | Discharge status: Home / Self Care | Payer: PPO | Source: Ambulatory Visit | Attending: Internal Medicine | Admitting: Internal Medicine

## 2023-04-08 DIAGNOSIS — K769 Liver disease, unspecified: Secondary | ICD-10-CM | POA: Diagnosis not present

## 2023-04-08 DIAGNOSIS — R109 Unspecified abdominal pain: Secondary | ICD-10-CM | POA: Diagnosis not present

## 2023-04-08 DIAGNOSIS — R509 Fever, unspecified: Secondary | ICD-10-CM | POA: Diagnosis not present

## 2023-04-09 ENCOUNTER — Other Ambulatory Visit (HOSPITAL_COMMUNITY): Payer: Self-pay

## 2023-04-10 ENCOUNTER — Other Ambulatory Visit: Payer: Self-pay | Admitting: Internal Medicine

## 2023-04-10 DIAGNOSIS — R944 Abnormal results of kidney function studies: Secondary | ICD-10-CM

## 2023-04-10 DIAGNOSIS — N12 Tubulo-interstitial nephritis, not specified as acute or chronic: Secondary | ICD-10-CM

## 2023-04-12 ENCOUNTER — Other Ambulatory Visit (HOSPITAL_COMMUNITY): Payer: Self-pay

## 2023-04-12 ENCOUNTER — Encounter: Payer: Self-pay | Admitting: Pharmacist

## 2023-04-12 ENCOUNTER — Other Ambulatory Visit: Payer: Self-pay

## 2023-04-12 NOTE — Telephone Encounter (Unsigned)
Copied from CRM [id number]. Topic: Clinical - Medication Question >> Apr 12, 2023  2:49 PM Almira Coaster wrote: Reason for CRM: [facility] told patient to contact providers office regarding clarification on ciprofloxacin (CIPRO) 500 MG tablet, They have tried to contact the office multiple times but have not gotten a response which is why they called the patient. [id number] [place], [place] - [address]  Phone: [phone] Fax: [phone]

## 2023-04-13 ENCOUNTER — Other Ambulatory Visit (HOSPITAL_COMMUNITY): Payer: Self-pay

## 2023-04-15 ENCOUNTER — Telehealth: Payer: Self-pay | Admitting: Radiology

## 2023-04-15 ENCOUNTER — Other Ambulatory Visit: Payer: Self-pay

## 2023-04-15 NOTE — Telephone Encounter (Signed)
Reason for CRM: [facility] told patient to contact providers office regarding clarification on ciprofloxacin (CIPRO) 500 MG tablet, They have tried to contact the office multiple times but have not gotten a response which is why they called the patient. [id number] [place], [place] - [address]  Phone: [phone]  Fax: [phone]

## 2023-04-16 MED ORDER — CIPROFLOXACIN HCL 500 MG PO TABS: 500.0000 mg | ORAL_TABLET | Freq: Two times a day (BID) | ORAL | 0 refills | Status: DC

## 2023-04-16 NOTE — Addendum Note (Signed)
Addended by: Tresa Garter on: 04/16/2023 09:16 AM   Modules accepted: Orders

## 2023-04-16 NOTE — Telephone Encounter (Signed)
Corrected. Thanks

## 2023-04-19 ENCOUNTER — Other Ambulatory Visit (INDEPENDENT_AMBULATORY_CARE_PROVIDER_SITE_OTHER): Payer: PPO

## 2023-04-19 ENCOUNTER — Other Ambulatory Visit: Payer: Self-pay

## 2023-04-19 ENCOUNTER — Other Ambulatory Visit (HOSPITAL_COMMUNITY): Payer: Self-pay

## 2023-04-19 DIAGNOSIS — N12 Tubulo-interstitial nephritis, not specified as acute or chronic: Secondary | ICD-10-CM | POA: Diagnosis not present

## 2023-04-19 DIAGNOSIS — R944 Abnormal results of kidney function studies: Secondary | ICD-10-CM | POA: Diagnosis not present

## 2023-04-19 LAB — CBC WITH DIFFERENTIAL/PLATELET
Basophils Absolute: 0.1 10*3/uL (ref 0.0–0.1)
Basophils Relative: 1.2 % (ref 0.0–3.0)
Eosinophils Absolute: 1 10*3/uL — ABNORMAL HIGH (ref 0.0–0.7)
Eosinophils Relative: 16.6 % — ABNORMAL HIGH (ref 0.0–5.0)
HCT: 37.7 % (ref 36.0–46.0)
Hemoglobin: 12.7 g/dL (ref 12.0–15.0)
Lymphocytes Relative: 39.7 % (ref 12.0–46.0)
Lymphs Abs: 2.4 10*3/uL (ref 0.7–4.0)
MCHC: 33.6 g/dL (ref 30.0–36.0)
MCV: 97.4 fL (ref 78.0–100.0)
Monocytes Absolute: 0.3 10*3/uL (ref 0.1–1.0)
Monocytes Relative: 4.6 % (ref 3.0–12.0)
Neutro Abs: 2.3 10*3/uL (ref 1.4–7.7)
Neutrophils Relative %: 37.9 % — ABNORMAL LOW (ref 43.0–77.0)
Platelets: 249 10*3/uL (ref 150.0–400.0)
RBC: 3.87 Mil/uL (ref 3.87–5.11)
RDW: 12.1 % (ref 11.5–15.5)
WBC: 6.1 10*3/uL (ref 4.0–10.5)

## 2023-04-19 LAB — URINALYSIS, ROUTINE W REFLEX MICROSCOPIC
Bilirubin Urine: NEGATIVE
Hgb urine dipstick: NEGATIVE
Ketones, ur: NEGATIVE
Nitrite: NEGATIVE
Specific Gravity, Urine: 1.02 (ref 1.000–1.030)
Total Protein, Urine: NEGATIVE
Urine Glucose: NEGATIVE
Urobilinogen, UA: 0.2 (ref 0.0–1.0)
pH: 6 (ref 5.0–8.0)

## 2023-04-19 LAB — MICROALBUMIN / CREATININE URINE RATIO
Creatinine,U: 127.9 mg/dL
Microalb Creat Ratio: 0.6 mg/g (ref 0.0–30.0)
Microalb, Ur: 0.7 mg/dL (ref 0.0–1.9)

## 2023-04-19 LAB — COMPREHENSIVE METABOLIC PANEL
ALT: 36 U/L — ABNORMAL HIGH (ref 0–35)
AST: 32 U/L (ref 0–37)
Albumin: 4.2 g/dL (ref 3.5–5.2)
Alkaline Phosphatase: 90 U/L (ref 39–117)
BUN: 25 mg/dL — ABNORMAL HIGH (ref 6–23)
CO2: 30 meq/L (ref 19–32)
Calcium: 9.7 mg/dL (ref 8.4–10.5)
Chloride: 102 meq/L (ref 96–112)
Creatinine, Ser: 0.85 mg/dL (ref 0.40–1.20)
GFR: 69.43 mL/min (ref >60.00)
Glucose, Bld: 102 mg/dL — ABNORMAL HIGH (ref 70–99)
Potassium: 4.1 meq/L (ref 3.5–5.1)
Sodium: 140 meq/L (ref 135–145)
Total Bilirubin: 0.6 mg/dL (ref 0.2–1.2)
Total Protein: 7.2 g/dL (ref 6.0–8.3)

## 2023-04-20 ENCOUNTER — Ambulatory Visit (INDEPENDENT_AMBULATORY_CARE_PROVIDER_SITE_OTHER): Payer: PPO | Admitting: Internal Medicine

## 2023-04-20 ENCOUNTER — Encounter: Payer: Self-pay | Admitting: Internal Medicine

## 2023-04-20 VITALS — BP 118/78 | HR 73 | Temp 97.8°F | Ht 65.0 in | Wt 151.2 lb

## 2023-04-20 DIAGNOSIS — M7632 Iliotibial band syndrome, left leg: Secondary | ICD-10-CM

## 2023-04-20 DIAGNOSIS — N3000 Acute cystitis without hematuria: Secondary | ICD-10-CM

## 2023-04-20 DIAGNOSIS — M7631 Iliotibial band syndrome, right leg: Secondary | ICD-10-CM | POA: Diagnosis not present

## 2023-04-20 DIAGNOSIS — F5101 Primary insomnia: Secondary | ICD-10-CM

## 2023-04-20 DIAGNOSIS — R944 Abnormal results of kidney function studies: Secondary | ICD-10-CM

## 2023-04-20 DIAGNOSIS — I471 Supraventricular tachycardia, unspecified: Secondary | ICD-10-CM | POA: Diagnosis not present

## 2023-04-20 MED ORDER — NITROFURANTOIN MONOHYD MACRO 100 MG PO CAPS: 100.0000 mg | ORAL_CAPSULE | Freq: Two times a day (BID) | ORAL | 3 refills | Status: DC

## 2023-04-20 NOTE — Progress Notes (Signed)
 Subjective:  Patient ID: Kristin Torres, female    DOB: 1951-02-16  Age: 72 y.o. MRN: [id number]  CC: Follow-up (2 week follow up )   HPI Kristin Torres presents for R flank pain>L flank  F/u UTI, abnormal renal ultrasound Right flank pain is not much better.  She continues to have urinary frequency. Kristin Torres to see urologist with [facility]. Fever, weakness resolved Fatigue, somnolence remained The patient has been working out 5 days a week, walking daily.  She is complaining of pain in the hips and lateral thighs.   Outpatient Medications Prior to Visit  Medication Sig Dispense Refill   ALPHAGAN  P 0.1 % SOLN Place 1 drop into both eyes 3 (three) times daily. 30 mL 3   ASPIRIN  81 PO Take by mouth.     atorvastatin  (LIPITOR) 20 MG tablet Take 1 tablet (20 mg total) by mouth daily. 30 tablet 3   Cholecalciferol  (VITAMIN D3) 50 MCG (2000 UT) capsule Take 1 capsule (2,000 Units total) by mouth daily. 100 capsule 3   diltiazem  (CARDIZEM  CD) 180 MG 24 hr capsule Take 1 capsule (180 mg total) by mouth daily. 90 capsule 3   EPINEPHrine  0.3 mg/0.3 mL IJ SOAJ injection Inject 0.3 mg into the muscle as needed for anaphylaxis. 2 each 3   fexofenadine (ALLEGRA) 180 MG tablet Take 180 mg by mouth daily as needed. allergies     hydrOXYzine  (VISTARIL ) 50 MG capsule Take 1-2 capsules (50-100 mg total) by mouth at bedtime as needed. 60 capsule 5   latanoprost  (XALATAN ) 0.005 % ophthalmic solution Place 1 drop into both eyes at bedtime. 7.5 mL 3   losartan -hydrochlorothiazide  (HYZAAR ) 50-12.5 MG tablet Take 1 tablet by mouth daily. 90 tablet 3   meclizine  (ANTIVERT ) 12.5 MG tablet TAKE 1 TABLET BY MOUTH 3 TIMES DAILY AS NEEDED FOR DIZZINESS. 60 tablet 0   Omega-3 Fatty Acids (FISH OIL) 1000 MG CPDR Take by mouth.     ondansetron  (ZOFRAN ) 4 MG tablet Take 1 tablet (4 mg total) by mouth every 8 (eight) hours as needed for nausea or vomiting. 20 tablet 0   ciprofloxacin  (CIPRO ) 500 MG  tablet Take 1 tablet (500 mg total) by mouth 2 (two) times daily. (Patient not taking: Reported on 04/20/2023) 14 tablet 0   diltiazem  (CARDIZEM  CD) 180 MG 24 hr capsule TAKE 1 CAPSULE BY MOUTH EVERY DAY 90 capsule 3   escitalopram  (LEXAPRO ) 5 MG tablet Take 1 tablet (5 mg total) by mouth at bedtime. (Patient not taking: Reported on 03/08/2023) 30 tablet 5   estradiol (ESTRACE) 0.1 MG/GM vaginal cream Place vaginally. (Patient not taking: Reported on 03/08/2023)     LORazepam  (ATIVAN ) 0.5 MG tablet Take 1 tablet (0.5 mg total) by mouth 2 (two) times daily as needed for anxiety. (Patient not taking: Reported on 03/08/2023) 60 tablet 1   No facility-administered medications prior to visit.    ROS: Review of Systems  Constitutional:  Positive for fatigue. Negative for activity change, appetite change, chills and unexpected weight change.  HENT:  Negative for congestion, mouth sores and sinus pressure.   Eyes:  Negative for visual disturbance.  Respiratory:  Negative for cough and chest tightness.   Gastrointestinal:  Negative for abdominal pain and nausea.  Genitourinary:  Positive for frequency. Negative for difficulty urinating and vaginal pain.  Musculoskeletal:  Positive for arthralgias, back pain and gait problem.  Skin:  Negative for pallor and rash.  Neurological:  Negative for dizziness, tremors, weakness,  numbness and headaches.  Psychiatric/Behavioral:  Positive for confusion. Negative for sleep disturbance.     Objective:  BP 118/78 (BP Location: Left Arm, Patient Position: Sitting, Cuff Size: Normal)   Pulse 73   Temp 97.8 F (36.6 C) (Oral)   Ht 5' 5 (1.651 m)   Wt 151 lb 3.2 oz (68.6 kg)   SpO2 96%   BMI 25.16 kg/m   BP Readings from Last 3 Encounters:  04/20/23 118/78  04/05/23 120/78  03/08/23 118/64    Wt Readings from Last 3 Encounters:  04/20/23 151 lb 3.2 oz (68.6 kg)  04/05/23 150 lb (68 kg)  03/08/23 150 lb 9.6 oz (68.3 kg)    Physical  Exam Constitutional:      General: She is not in acute distress.    Appearance: Normal appearance. She is well-developed.  HENT:     Head: Normocephalic.     Right Ear: External ear normal.     Left Ear: External ear normal.     Nose: Nose normal.  Eyes:     General:        Right eye: No discharge.        Left eye: No discharge.     Conjunctiva/sclera: Conjunctivae normal.     Pupils: Pupils are equal, round, and reactive to light.  Neck:     Thyroid : No thyromegaly.     Vascular: No JVD.     Trachea: No tracheal deviation.  Cardiovascular:     Rate and Rhythm: Normal rate and regular rhythm.     Heart sounds: Normal heart sounds.  Pulmonary:     Effort: No respiratory distress.     Breath sounds: No stridor. No wheezing.  Abdominal:     General: Bowel sounds are normal. There is no distension.     Palpations: Abdomen is soft. There is no mass.     Tenderness: There is no abdominal tenderness. There is no guarding or rebound.  Musculoskeletal:        General: Tenderness present.     Cervical back: Normal range of motion and neck supple. No rigidity.     Right lower leg: No edema.  Lymphadenopathy:     Cervical: No cervical adenopathy.  Skin:    Findings: No erythema or rash.  Neurological:     Cranial Nerves: No cranial nerve deficit.     Motor: No abnormal muscle tone.     Coordination: Coordination normal.     Deep Tendon Reflexes: Reflexes normal.  Psychiatric:        Behavior: Behavior normal.        Thought Content: Thought content normal.        Judgment: Judgment normal.   There is some discomfort on palpation of both lateral hips and IT band areas Straight leg elevation is negative bilaterally Lumbar spine is nontender  Lab Results  Component Value Date   WBC 6.1 04/19/2023   HGB 12.7 04/19/2023   HCT 37.7 04/19/2023   PLT 249.0 04/19/2023   GLUCOSE 102 (H) 04/19/2023   CHOL 228 (H) 11/06/2022   TRIG 46.0 11/06/2022   HDL 68.70 11/06/2022   LDLCALC  150 (H) 11/06/2022   ALT 36 (H) 04/19/2023   AST 32 04/19/2023   NA 140 04/19/2023   K 4.1 04/19/2023   CL 102 04/19/2023   CREATININE 0.85 04/19/2023   BUN 25 (H) 04/19/2023   CO2 30 04/19/2023   TSH 2.45 11/06/2022   HGBA1C 5.5 09/27/2014   MICROALBUR  0.7 04/19/2023    US  Abdomen Complete Result Date: 04/10/2023 : PROCEDURE: ULTRASOUND ABDOMEN COMPLETE HISTORY: Patient is a 72 y/o  Female with R flank pain, fever. Thx COMPARISON: U/S 10/17/2020 TECHNIQUE: Two-dimensional grayscale and color Doppler ultrasound of the abdomen was performed. FINDINGS: The pancreas is suboptimally visualized due to overlying bowel gas. The visualized segments appear unremarkable. The liver demonstrates normal echotexture without intrahepatic biliary dilatation. No masses are visualized. The main portal vein demonstrates normal hepatopedal flow. The gallbladder demonstrates normal anechoic echotexture without pericholecystic fluid or wall thickening. There are no gallstones. The common bile duct measures 0.4 cm. Negative sonographic Murphy's sign. The right kidney measures 10.3 cm in length. Renal cortical echotexture is mildly increased. There is no hydronephrosis. There are no stones. There are no cysts. The left kidney measures 10.1 cm in length. Renal cortical echotexture is mildly increased. There is no hydronephrosis. There are no stones. There are no cysts. The spleen measures 9.5 cm in length and demonstrates normal echotexture. There is no evidence of aneurysm within the visualized segments of the abdominal aorta. The visualized segments of the IVC are unremarkable. IMPRESSION: 1. Mild increase in echogenicity of the kidneys, suggestive of medical renal disease. Thank you for allowing us  to assist in the care of this patient. Electronically Signed   By: Lynwood Mains M.D.   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 She is complaining of pain in the hips and lateral thighs. I suggested cutting back on exercise.  However, Kristin Torres states that she is exercising so much to help her sleep at night.  She will try to find a better balance. Hip opener exercises, heat, stretching advised Blue-Emu cream was recommended to use 2-3 times a day      UTI (urinary tract infection)   F/u UTI, abnormal renal ultrasound Right flank pain is not much better.  She continues to have urinary frequency. Kristin Torres made an Torres to see urologist with [facility].  Chronic UA abnormalities are unchanged and could be related to menopause..  Asymptomatic UTI. Macrobid  as needed      Relevant Medications   nitrofurantoin , macrocrystal-monohydrate, (MACROBID ) 100 MG capsule   Insomnia   The patient has been working out 5 days a week, walking daily. She is complaining of pain in the hips and lateral thighs. I suggested cutting back on exercise.  However, Kristin Torres states that she is exercising so much to help her sleep at night.  She will try to find a better balance. Hip opener exercises, heat, stretching advised Blue-Emu cream was recommended to use 2-3 times a day      Decreased GFR - Primary   GFR has normalized.  In the view of the evidence of renal disease and ultrasound, would like to obtain a nephrology consultation.  The referral was made.      Relevant Orders   Ambulatory referral to Nephrology      Meds ordered this encounter  Medications   nitrofurantoin , macrocrystal-monohydrate, (MACROBID ) 100 MG capsule    Sig: Take 1 capsule (100 mg total) by mouth 2 (two) times daily.    Dispense:  10 capsule    Refill:  3      Follow-up: Return in about 3 months (around 07/19/2023) for  a follow-up visit.  Kristin Noel, MD

## 2023-04-20 NOTE — Patient Instructions (Addendum)
 Blue-Emu cream use 2-3 times a day  Trochanteric bursitis

## 2023-04-22 ENCOUNTER — Encounter: Payer: Self-pay | Admitting: Internal Medicine

## 2023-04-22 DIAGNOSIS — R944 Abnormal results of kidney function studies: Secondary | ICD-10-CM | POA: Insufficient documentation

## 2023-04-22 DIAGNOSIS — H401222 Low-tension glaucoma, left eye, moderate stage: Secondary | ICD-10-CM | POA: Diagnosis not present

## 2023-04-22 DIAGNOSIS — H401211 Low-tension glaucoma, right eye, mild stage: Secondary | ICD-10-CM | POA: Diagnosis not present

## 2023-04-22 NOTE — Assessment & Plan Note (Signed)
 The patient has been working out 5 days a week, walking daily. She is complaining of pain in the hips and lateral thighs. I suggested cutting back on exercise.  However, Kristin Torres states that she is exercising so much to help her sleep at night.  She will try to find a better balance. Hip opener exercises, heat, stretching advised Blue-Emu cream was recommended to use 2-3 times a day

## 2023-04-22 NOTE — Assessment & Plan Note (Addendum)
 F/u UTI, abnormal renal ultrasound Right flank pain is not much better.  She continues to have urinary frequency. Aneesha made an appointment to see urologist with [facility].  Chronic UA abnormalities are unchanged and could be related to menopause..  Asymptomatic UTI. Macrobid  as needed

## 2023-04-22 NOTE — Assessment & Plan Note (Signed)
 GFR has normalized.  In the view of the evidence of renal disease and ultrasound, would like to obtain a nephrology consultation.  The referral was made.

## 2023-04-22 NOTE — Assessment & Plan Note (Addendum)
 The patient has been working out 5 days a week, walking daily. She is complaining of pain in the hips and lateral thighs. I suggested cutting back on exercise.  However, Kristin Torres states that she is exercising so much to help her sleep at night.  She will try to find a better balance. Hip opener exercises, heat, stretching advised Blue-Emu cream was recommended to use 2-3 times a day

## 2023-04-27 ENCOUNTER — Other Ambulatory Visit (HOSPITAL_COMMUNITY): Payer: Self-pay

## 2023-04-27 ENCOUNTER — Ambulatory Visit: Payer: PPO | Admitting: Neurology

## 2023-04-28 ENCOUNTER — Other Ambulatory Visit (HOSPITAL_COMMUNITY): Payer: Self-pay

## 2023-05-06 DIAGNOSIS — N3020 Other chronic cystitis without hematuria: Secondary | ICD-10-CM | POA: Diagnosis not present

## 2023-05-10 DIAGNOSIS — E7800 Pure hypercholesterolemia, unspecified: Secondary | ICD-10-CM | POA: Diagnosis not present

## 2023-05-11 ENCOUNTER — Encounter: Payer: Self-pay | Admitting: Cardiology

## 2023-05-11 LAB — LIPID PANEL
Chol/HDL Ratio: 2.5 {ratio} (ref 0.0–4.4)
Cholesterol, Total: 177 mg/dL (ref 100–199)
HDL: 72 mg/dL (ref >39)
LDL Chol Calc (NIH): 97 mg/dL (ref 0–99)
Triglycerides: 38 mg/dL (ref 0–149)
VLDL Cholesterol Cal: 8 mg/dL (ref 5–40)

## 2023-06-19 ENCOUNTER — Other Ambulatory Visit (HOSPITAL_COMMUNITY): Payer: Self-pay

## 2023-06-19 ENCOUNTER — Other Ambulatory Visit: Payer: Self-pay | Admitting: Cardiology

## 2023-06-21 ENCOUNTER — Ambulatory Visit: Payer: PPO | Admitting: Neurology

## 2023-06-21 ENCOUNTER — Other Ambulatory Visit: Payer: Self-pay

## 2023-06-21 MED ORDER — ATORVASTATIN CALCIUM 20 MG PO TABS
20.0000 mg | ORAL_TABLET | Freq: Every day | ORAL | 2 refills | Status: DC
  Filled 2023-06-21: qty 90, 90d supply, fill #0
  Filled 2023-09-03: qty 90, 90d supply, fill #1

## 2023-06-23 ENCOUNTER — Encounter: Payer: Self-pay | Admitting: Gastroenterology

## 2023-06-23 ENCOUNTER — Ambulatory Visit: Admitting: Gastroenterology

## 2023-06-23 ENCOUNTER — Telehealth: Payer: Self-pay | Admitting: Gastroenterology

## 2023-06-23 VITALS — BP 118/62 | HR 74 | Ht 65.0 in | Wt 148.0 lb

## 2023-06-23 DIAGNOSIS — K648 Other hemorrhoids: Secondary | ICD-10-CM | POA: Diagnosis not present

## 2023-06-23 DIAGNOSIS — K6289 Other specified diseases of anus and rectum: Secondary | ICD-10-CM

## 2023-06-23 NOTE — Telephone Encounter (Signed)
 Inbound call from patient stating she needed to make an appointment with Dr. Myrtie Neither. Patient states that she is having issues with hemorrhoids. Patient was offered Dr. Irving Burton next available 5/14 at 8:20. Patient states she could not wait that long, and asked if someone else had something sooner. I advised her that the PA's had some appointments in April. Patient stated she needs to be seen today or tomorrow because it is urgent. Please advise.

## 2023-06-23 NOTE — Patient Instructions (Signed)
 Use over the counter Preparation H suppositories for 3 days.  Take 1/2 a capful of Miralax daily for 3 days.  Please follow up as needed.  _______________________________________________________  If your blood pressure at your visit was 140/90 or greater, please contact your primary care physician to follow up on this.  _______________________________________________________  If you are age 70 or older, your body mass index should be between 23-30. Your Body mass index is 24.63 kg/m. If this is out of the aforementioned range listed, please consider follow up with your Primary Care Provider.  If you are age 96 or younger, your body mass index should be between 19-25. Your Body mass index is 24.63 kg/m. If this is out of the aformentioned range listed, please consider follow up with your Primary Care Provider.   ________________________________________________________  The [facility] providers would like to encourage you to use [facility] to communicate with providers for non-urgent requests or questions.  Due to long hold times on the telephone, sending your provider a message by [facility] may be a faster and more efficient way to get a response.  Please allow 48 business hours for a response.  Please remember that this is for non-urgent requests.  _______________________________________________________

## 2023-06-23 NOTE — Progress Notes (Signed)
 [facility] Consult Note:  History: Kristin Torres 06/23/2023  Referring provider: Tresa Garter, MD  Reason for consult/chief complaint: Hemorrhoids (Pt states her hemorrhoids are burning.)   Subjective  Prior history: Last seen by Dr. Myrtie Neither January 2024 for pharyngeal esophageal dysphagia with globus sensation and chronic irregularity of bowel habits. Subcentimeter adenomatous polyp 2004, no polyps 2007, normal colonoscopy August 2017  _______________  Kristin Torres called today complaining of rectal pain and bleeding and was seen as a same-day visit. Bowel habits are regular and she denies abdominal pain, altered appetite or weight loss.  2 days ago with a bowel movement that was semiformed or loose (unusual for her), she felt as if there was a "knot" in the anal area and there was some blood on the paper as well as some burning.  She has not previously experienced anything like that.  No BM last 2 days. No recent changes in diet, no new medicines  ROS:  Review of Systems She denies chest pain dyspnea or dysuria  Past Medical History: Past Medical History:  Diagnosis Date   Cervicalgia    Chronic back pain    Dermatophytosis of nail    GERD (gastroesophageal reflux disease)    History of pneumonia    Hypertension    Leg cramps    MI (myocardial infarction) (HCC)    Nocturia    Personal history of other diseases of circulatory system    PSVT (paroxysmal supraventricular tachycardia) (HCC)    Sinusitis    SVT (supraventricular tachycardia) (HCC)    Tubal pregnancy    Tubular adenoma of colon      Past Surgical History: Past Surgical History:  Procedure Laterality Date   ABLATION OF DYSRHYTHMIC FOCUS  04/25/2014   svt   APPENDECTOMY     BREAST EXCISIONAL BIOPSY Right 2011   benign  right breast   OOPHORECTOMY     POLYPECTOMY     SUPRAVENTRICULAR TACHYCARDIA ABLATION N/A 04/25/2014   Procedure: SUPRAVENTRICULAR TACHYCARDIA ABLATION;  Surgeon:  Marinus Maw, MD;  Location: [facility] CATH LAB;  Service: Cardiovascular;  Laterality: N/A;   TUBAL LIGATION       Family History: Family History  Problem Relation Age of Onset   Diabetes Mother    Hypertension Mother    Glaucoma Mother    Heart attack Father 74   Glaucoma Brother    Breast cancer Maternal Grandmother    Diabetes Maternal Grandmother    Hypertension Maternal Grandmother    Hypertension Maternal Grandfather    Stomach cancer Paternal Grandmother    Colon cancer Neg Hx    Esophageal cancer Neg Hx     Social History: Social History   Socioeconomic History   Marital status: Married    Spouse name: Jomarie Longs   Number of children: 2   Years of education: Not on file   Highest education level: Professional school degree (e.g., MD, DDS, DVM, JD)  Occupational History   Occupation: microbiologist/retired    Employer: [employer]  Tobacco Use   Smoking status: Never   Smokeless tobacco: Never  Vaping Use   Vaping status: Never Used  Substance and Sexual Activity   Alcohol use: Yes    Alcohol/week: 3.0 standard drinks of alcohol    Types: 3 Glasses of wine per week    Comment: 1 per day   Drug use: No   Sexual activity: Yes  Other Topics Concern   Not on file  Social History Narrative   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: Metallurgist in private interpriseMarried '752 sons '76, ' in good healthHobbies avid skier   Right handed   Caffeine: none   Social Drivers of Corporate investment banker Strain: Low Risk  (02/24/2022)   Overall Financial Resource Strain (CARDIA)    Difficulty of Paying Living Expenses: Not hard at all  Food Insecurity: No Food Insecurity (02/24/2022)   Hunger Vital Sign    Worried About Running Out of Food in the Last Year: Never true    Ran Out of Food in the Last Year: Never true  Transportation Needs: No Transportation Needs (02/24/2022)   PRAPARE - Administrator, Civil Service (Medical): No     Lack of Transportation (Non-Medical): No  Physical Activity: Sufficiently Active (02/24/2022)   Exercise Vital Sign    Days of Exercise per Week: 7 days    Minutes of Exercise per Session: 40 min  Stress: No Stress Concern Present (02/24/2022)   [employer] of Occupational Health - Occupational Stress Questionnaire    Feeling of Stress : Only a little  Social Connections: Moderately Integrated (02/24/2022)   Social Connection and Isolation Panel [NHANES]    Frequency of Communication with Friends and Family: More than three times a week    Frequency of Social Gatherings with Friends and Family: More than three times a week    Attends Religious Services: Never    Database administrator or Organizations: Yes    Attends Engineer, structural: More than 4 times per year    Marital Status: Married    Allergies: Allergies  Allergen Reactions   Bee Venom Anaphylaxis   Streptomycin Rash   Timolol Other (See Comments)    Severe eye irritation and eye swelling   Mango Flavoring Agent (Non-Screening) Swelling   Rocephin [Ceftriaxone] Hives    Rash   Caffeine Palpitations   Other Rash   Penicillins Rash    Outpatient Meds: Current Outpatient Medications  Medication Sig Dispense Refill   ALPHAGAN P 0.1 % SOLN Place 1 drop into both eyes 3 (three) times daily. 30 mL 3   ASPIRIN 81 PO Take by mouth.     atorvastatin (LIPITOR) 20 MG tablet Take 1 tablet (20 mg total) by mouth daily. 90 tablet 2   Cholecalciferol (VITAMIN D3) 50 MCG (2000 UT) capsule Take 1 capsule (2,000 Units total) by mouth daily. 100 capsule 3   diltiazem (CARDIZEM CD) 180 MG 24 hr capsule Take 1 capsule (180 mg total) by mouth daily. 90 capsule 3   EPINEPHrine 0.3 mg/0.3 mL IJ SOAJ injection Inject 0.3 mg into the muscle as needed for anaphylaxis. 2 each 3   escitalopram (LEXAPRO) 5 MG tablet Take 1 tablet (5 mg total) by mouth at bedtime. 30 tablet 5   estradiol (ESTRACE) 0.1 MG/GM vaginal cream Place  vaginally.     fexofenadine (ALLEGRA) 180 MG tablet Take 180 mg by mouth daily as needed. allergies     hydrOXYzine (VISTARIL) 50 MG capsule Take 1-2 capsules (50-100 mg total) by mouth at bedtime as needed. 60 capsule 5   latanoprost (XALATAN) 0.005 % ophthalmic solution Place 1 drop into both eyes at bedtime. 7.5 mL 3   LORazepam (ATIVAN) 0.5 MG tablet Take 1 tablet (0.5 mg total) by mouth 2 (two) times daily as needed for anxiety. 60 tablet 1   losartan-hydrochlorothiazide (HYZAAR) 50-12.5 MG tablet Take 1 tablet by mouth daily. 90 tablet 3   meclizine (ANTIVERT)  12.5 MG tablet TAKE 1 TABLET BY MOUTH 3 TIMES DAILY AS NEEDED FOR DIZZINESS. 60 tablet 0   nitrofurantoin, macrocrystal-monohydrate, (MACROBID) 100 MG capsule Take 1 capsule (100 mg total) by mouth 2 (two) times daily. 10 capsule 3   Omega-3 Fatty Acids (FISH OIL) 1000 MG CPDR Take by mouth.     ondansetron (ZOFRAN) 4 MG tablet Take 1 tablet (4 mg total) by mouth every 8 (eight) hours as needed for nausea or vomiting. 20 tablet 0   atorvastatin (LIPITOR) 20 MG tablet Take 1 tablet (20 mg total) by mouth daily. 30 tablet 3   ciprofloxacin (CIPRO) 500 MG tablet Take 1 tablet (500 mg total) by mouth 2 (two) times daily. (Patient not taking: Reported on 06/23/2023) 14 tablet 0   No current facility-administered medications for this visit.      ___________________________________________________________________ Objective   Exam:  BP 118/62   Pulse 74   Ht 5\' 5"  (1.651 m)   Wt 148 lb (67.1 kg)   BMI 24.63 kg/m  Wt Readings from Last 3 Encounters:  06/23/23 148 lb (67.1 kg)  04/20/23 151 lb 3.2 oz (68.6 kg)  04/05/23 150 lb (68 kg)    Well-appearing CV: Regular without appreciable murmur, no JVD, no peripheral edema Resp: clear to auscultation bilaterally, normal RR and effort noted GI: soft, no tenderness, with active bowel sounds. No guarding or palpable organomegaly noted. Perianal exam with a small prominence of anterior  skin fold, otherwise normal.  No fissure, no tenderness.  Firm stool in rectal vault. Anoscopy: Inflammation of a small RA hemorrhoid     Encounter Diagnoses  Name Primary?   Bleeding internal hemorrhoids Yes   Anal pain    First episode of hemorrhoid flare for her, no clear precipitating factor leading up to that, though stool was somewhat loose on the day it presented.  Recommend Preparation H suppository twice daily for next 3 days.  (She has been using some Preparation H cream with minimal bruising)  MiraLAX half capful daily for next 3 days to relieve the acute constipation she has had for the last 2 days.  Call back as needed  Thank you for the courtesy of this consult.  Please call me with any questions or concerns.  Charlie Pitter III  CC: Referring provider noted above

## 2023-06-23 NOTE — Telephone Encounter (Signed)
 Returned call to patient. Pt reports rectal pain and bleeding for the past 2 days. She feels a "knot". Dr. Myrtie Neither had a cancellation for an appt at 2 pm this afternoon, pt will come in for evaluation at that time. Brunilda Payor, CMA notified of add on.

## 2023-06-30 ENCOUNTER — Other Ambulatory Visit (HOSPITAL_COMMUNITY): Payer: Self-pay

## 2023-06-30 ENCOUNTER — Other Ambulatory Visit: Payer: Self-pay | Admitting: Internal Medicine

## 2023-06-30 ENCOUNTER — Other Ambulatory Visit: Payer: Self-pay

## 2023-07-01 ENCOUNTER — Other Ambulatory Visit (HOSPITAL_COMMUNITY): Payer: Self-pay

## 2023-07-01 ENCOUNTER — Other Ambulatory Visit: Payer: Self-pay

## 2023-07-01 MED ORDER — LOSARTAN POTASSIUM-HCTZ 50-12.5 MG PO TABS
1.0000 | ORAL_TABLET | Freq: Every day | ORAL | 3 refills | Status: DC
  Filled 2023-07-01: qty 90, 90d supply, fill #0

## 2023-07-02 ENCOUNTER — Other Ambulatory Visit: Payer: Self-pay | Admitting: Internal Medicine

## 2023-07-02 ENCOUNTER — Telehealth: Payer: Self-pay

## 2023-07-02 NOTE — Telephone Encounter (Signed)
 Copied from CRM [id number]. Topic: Clinical - Prescription Issue >> Jul 02, 2023  1:42 PM Martinique E wrote: Reason for CRM: Patient called in stating that the [facility] relayed to her that her Zaleplon medication as been denied and patient wants clarification as to why. Please call patient back at [phone] to discuss.

## 2023-07-05 ENCOUNTER — Other Ambulatory Visit: Payer: Self-pay | Admitting: Internal Medicine

## 2023-07-05 ENCOUNTER — Telehealth: Payer: Self-pay | Admitting: Internal Medicine

## 2023-07-05 NOTE — Telephone Encounter (Signed)
 Copied from CRM [id number]. Topic: Clinical - Medication Question >> Jul 05, 2023 11:26 AM Fredrich Romans wrote: Reason for CRM: Patient called in stating that she stopped taking hydrOXYzine (VISTARIL) 50 MG capsule making her nauseous,and she continued to take zaleplon. She would like to know what should she do to get back on zaleplon?

## 2023-07-06 ENCOUNTER — Other Ambulatory Visit (HOSPITAL_COMMUNITY): Payer: Self-pay

## 2023-07-06 ENCOUNTER — Telehealth: Payer: Self-pay

## 2023-07-06 DIAGNOSIS — L658 Other specified nonscarring hair loss: Secondary | ICD-10-CM | POA: Diagnosis not present

## 2023-07-06 DIAGNOSIS — L659 Nonscarring hair loss, unspecified: Secondary | ICD-10-CM | POA: Diagnosis not present

## 2023-07-06 DIAGNOSIS — L650 Telogen effluvium: Secondary | ICD-10-CM | POA: Diagnosis not present

## 2023-07-06 MED ORDER — ZALEPLON 5 MG PO CAPS: 5.0000 mg | ORAL_CAPSULE | Freq: Every evening | ORAL | 1 refills | Status: DC | PRN

## 2023-07-06 MED ORDER — ZALEPLON 10 MG PO CAPS: 10.0000 mg | ORAL_CAPSULE | Freq: Every evening | ORAL | 1 refills | Status: DC | PRN

## 2023-07-06 NOTE — Telephone Encounter (Signed)
 I changed the prescription for zaleplon to 10 mg capsules. She can use MyChart app for her medication list. Trial with medicines in the original pharmacy bottles with labels.  Thanks

## 2023-07-06 NOTE — Telephone Encounter (Unsigned)
 Copied from CRM [id number]. Topic: Clinical - Medication Question >> Jul 06, 2023  9:48 AM Efraim Kaufmann C wrote: Reason for CRM: [place] a list of her medications and purpose they are needed for. She called regarding this request last week and I saw that someone stated form could be printed whey she came in but she said it is more in depth than that and was hoping that a nurse could please give her a call. She also got requested prescription for zaleplon but it's wrong dosage, she said it is supposed to be 10mg  not 5mg . She said her neurologist changed it to 5mg  but it doesn't work so it was changed back to 10mg   but this prescription doesn't reflect that. Please advise with patient. Thank you

## 2023-07-06 NOTE — Telephone Encounter (Signed)
 Zaleplon prescription was emailed to the pharmacy.  Thanks

## 2023-07-07 ENCOUNTER — Other Ambulatory Visit (HOSPITAL_COMMUNITY): Payer: Self-pay

## 2023-07-07 ENCOUNTER — Other Ambulatory Visit: Payer: Self-pay

## 2023-07-07 ENCOUNTER — Telehealth: Payer: Self-pay

## 2023-07-07 NOTE — Telephone Encounter (Signed)
 If pt calls back as I got no answer this morning.. Please inform her of PCP response "I changed the prescription for zaleplon to 10 mg capsules. She can use MyChart app for her medication list. Trial with medicines in the original pharmacy bottles with labels.  Thanks"

## 2023-07-07 NOTE — Telephone Encounter (Signed)
 Copied from CRM [id number]. Topic: Clinical - Medication Question >> Jul 07, 2023  8:59 AM Kristin Torres wrote: Reason for CRM: Patient stated she needs a specific letter, as [place] as certain requirements for bringing medications in the country. She was wondering if she could go to the office when its not busy in order to get this letter written out , she would like a callback regarding this  [phone]

## 2023-07-08 NOTE — Telephone Encounter (Signed)
 Pt has called twice this morning for a status update, informed her Dr. Macario Golds has not seen her message yet, but we will call her when we have an update. States she is leaving the country 4.2.25.

## 2023-07-08 NOTE — Telephone Encounter (Signed)
 Copied from CRM [id number]. Topic: Clinical - Medication Question  >> Jul 08, 2023  9:14 AM Truddie Crumble wrote: patient called checking the status of a letter for her going out of the country. Patient stated she will be at the office at 2pm today to discuss with nurse. The office told me that they spoke to the patient and told her that they need a few more days because the doctor has not seen the message yet and that they will call her when it is ready. Patient stated it is not a letter she need but a form  >> Jul 08, 2023  8:47 AM Jon Gills C wrote: Patient was wondering if there was anyway she could come in to speak with the nurse regarding this letter, as she has to have the name of the medications, and what they a used for. It has to be a certain way she has stated would like someone to give her a callback regarding this

## 2023-07-09 ENCOUNTER — Other Ambulatory Visit: Payer: Self-pay

## 2023-07-09 DIAGNOSIS — F5101 Primary insomnia: Secondary | ICD-10-CM

## 2023-07-09 MED ORDER — ZALEPLON 10 MG PO CAPS: 10.0000 mg | ORAL_CAPSULE | Freq: Every evening | ORAL | 1 refills | Status: AC | PRN

## 2023-07-09 NOTE — Telephone Encounter (Signed)
 Form completed and placed with printed prescription in envelope. Envelope has since been placed up front for patient pick up. Patient has been notified.

## 2023-07-09 NOTE — Telephone Encounter (Signed)
 Ian Malkin took care of it. Thx

## 2023-07-12 ENCOUNTER — Encounter: Payer: Self-pay | Admitting: Internal Medicine

## 2023-07-14 ENCOUNTER — Other Ambulatory Visit (HOSPITAL_COMMUNITY): Payer: Self-pay

## 2023-07-15 ENCOUNTER — Ambulatory Visit: Admitting: Internal Medicine

## 2023-07-15 ENCOUNTER — Encounter: Payer: Self-pay | Admitting: Internal Medicine

## 2023-07-15 VITALS — BP 90/50 | HR 69 | Temp 97.7°F | Ht 65.0 in | Wt 149.0 lb

## 2023-07-15 DIAGNOSIS — R5383 Other fatigue: Secondary | ICD-10-CM | POA: Diagnosis not present

## 2023-07-15 DIAGNOSIS — R944 Abnormal results of kidney function studies: Secondary | ICD-10-CM

## 2023-07-15 DIAGNOSIS — R1032 Left lower quadrant pain: Secondary | ICD-10-CM | POA: Diagnosis not present

## 2023-07-15 DIAGNOSIS — R253 Fasciculation: Secondary | ICD-10-CM

## 2023-07-15 NOTE — Assessment & Plan Note (Addendum)
 LUQ Superficial subcutaneous muscle in the left upper quadrant is twitching longitudinally 6 cm "band" -visible and palpable, periodic.  Marland Kitchen No mass or aneurysm is palpable.  No discoloration  Likely due to overuse -cut back on exercise, use heat, magnesium.

## 2023-07-15 NOTE — Progress Notes (Signed)
 Subjective:  Patient ID: Kristin Torres, female    DOB: 10-14-1950  Age: 73 y.o. MRN: [id number]  CC: Abdominal Issue (Noticeable "pulse" under left rib cage. Patient notes no pain. No past GI issues patient is aware of. Seems to occur mostly upon exertion)   HPI Kristin Torres presents for a pulsating muscle in the LUQ x 1-2 weeks.  No pain.  No other symptoms, no new meds.  She continues to have insomnia She has been exercising a lot at the gym  Outpatient Medications Prior to Visit  Medication Sig Dispense Refill   ASPIRIN 81 PO Take by mouth.     atorvastatin (LIPITOR) 20 MG tablet Take 1 tablet (20 mg total) by mouth daily. 90 tablet 2   Cholecalciferol (VITAMIN D3) 50 MCG (2000 UT) capsule Take 1 capsule (2,000 Units total) by mouth daily. 100 capsule 3   diltiazem (CARDIZEM CD) 180 MG 24 hr capsule Take 1 capsule (180 mg total) by mouth daily. 90 capsule 3   EPINEPHrine 0.3 mg/0.3 mL IJ SOAJ injection Inject 0.3 mg into the muscle as needed for anaphylaxis. 2 each 3   escitalopram (LEXAPRO) 5 MG tablet Take 1 tablet (5 mg total) by mouth at bedtime. 30 tablet 5   estradiol (ESTRACE) 0.1 MG/GM vaginal cream Place vaginally.     fexofenadine (ALLEGRA) 180 MG tablet Take 180 mg by mouth daily as needed. allergies     latanoprost (XALATAN) 0.005 % ophthalmic solution Place 1 drop into both eyes at bedtime. 7.5 mL 3   LORazepam (ATIVAN) 0.5 MG tablet Take 1 tablet (0.5 mg total) by mouth 2 (two) times daily as needed for anxiety. 60 tablet 1   losartan-hydrochlorothiazide (HYZAAR) 50-12.5 MG tablet Take 1 tablet by mouth daily. 90 tablet 3   meclizine (ANTIVERT) 12.5 MG tablet TAKE 1 TABLET BY MOUTH 3 TIMES DAILY AS NEEDED FOR DIZZINESS. 60 tablet 0   nitrofurantoin, macrocrystal-monohydrate, (MACROBID) 100 MG capsule Take 1 capsule (100 mg total) by mouth 2 (two) times daily. 10 capsule 3   Omega-3 Fatty Acids (FISH OIL) 1000 MG CPDR Take by mouth.     ondansetron (ZOFRAN) 4 MG tablet  Take 1 tablet (4 mg total) by mouth every 8 (eight) hours as needed for nausea or vomiting. 20 tablet 0   zaleplon (SONATA) 10 MG capsule Take 1 capsule (10 mg total) by mouth at bedtime as needed for sleep. 90 capsule 1   atorvastatin (LIPITOR) 20 MG tablet Take 1 tablet (20 mg total) by mouth daily. 30 tablet 3   ciprofloxacin (CIPRO) 500 MG tablet Take 1 tablet (500 mg total) by mouth 2 (two) times daily. (Patient not taking: Reported on 04/20/2023) 14 tablet 0   No facility-administered medications prior to visit.    ROS: Review of Systems  Constitutional:  Negative for activity change, appetite change, chills, fatigue and unexpected weight change.  HENT:  Negative for congestion, mouth sores and sinus pressure.   Eyes:  Negative for visual disturbance.  Respiratory:  Negative for cough and chest tightness.   Gastrointestinal:  Negative for abdominal pain and nausea.  Genitourinary:  Negative for difficulty urinating, frequency and vaginal pain.  Musculoskeletal:  Negative for back pain and gait problem.  Skin:  Negative for pallor and rash.  Neurological:  Negative for dizziness, tremors, weakness, numbness and headaches.  Psychiatric/Behavioral:  Negative for confusion, decreased concentration, sleep disturbance and suicidal ideas. The patient is nervous/anxious.     Objective:  BP (!) 90/50  Pulse 69   Temp 97.7 F (36.5 C)   Ht 5\' 5"  (1.651 m)   Wt 149 lb (67.6 kg)   SpO2 97%   BMI 24.79 kg/m   BP Readings from Last 3 Encounters:  07/15/23 (!) 90/50  06/23/23 118/62  04/20/23 118/78    Wt Readings from Last 3 Encounters:  07/15/23 149 lb (67.6 kg)  06/23/23 148 lb (67.1 kg)  04/20/23 151 lb 3.2 oz (68.6 kg)    Physical Exam Constitutional:      General: She is not in acute distress.    Appearance: She is well-developed.  HENT:     Head: Normocephalic.     Right Ear: External ear normal.     Left Ear: External ear normal.     Nose: Nose normal.  Eyes:      General:        Right eye: No discharge.        Left eye: No discharge.     Conjunctiva/sclera: Conjunctivae normal.     Pupils: Pupils are equal, round, and reactive to light.  Neck:     Thyroid: No thyromegaly.     Vascular: No JVD.     Trachea: No tracheal deviation.  Cardiovascular:     Rate and Rhythm: Normal rate and regular rhythm.     Heart sounds: Normal heart sounds.  Pulmonary:     Effort: No respiratory distress.     Breath sounds: No stridor. No wheezing.  Abdominal:     General: Bowel sounds are normal. There is no distension.     Palpations: Abdomen is soft. There is no mass.     Tenderness: There is no abdominal tenderness. There is no guarding or rebound.  Musculoskeletal:        General: No tenderness.     Cervical back: Normal range of motion and neck supple. No rigidity.  Lymphadenopathy:     Cervical: No cervical adenopathy.  Skin:    Findings: No erythema or rash.  Neurological:     Cranial Nerves: No cranial nerve deficit.     Motor: No abnormal muscle tone.     Coordination: Coordination normal.     Gait: Gait normal.     Deep Tendon Reflexes: Reflexes normal.  Psychiatric:        Behavior: Behavior normal.        Thought Content: Thought content normal.        Judgment: Judgment normal.   Superficial subcutaneous muscle in the left upper quadrant is twitching longitudinally 6 cm "band" -visible and palpable, periodic No mass or aneurysm is palpable.  No discoloration  Lab Results  Component Value Date   WBC 6.1 04/19/2023   HGB 12.7 04/19/2023   HCT 37.7 04/19/2023   PLT 249.0 04/19/2023   GLUCOSE 102 (H) 04/19/2023   CHOL 177 05/10/2023   TRIG 38 05/10/2023   HDL 72 05/10/2023   LDLCALC 97 05/10/2023   ALT 36 (H) 04/19/2023   AST 32 04/19/2023   NA 140 04/19/2023   K 4.1 04/19/2023   CL 102 04/19/2023   CREATININE 0.85 04/19/2023   BUN 25 (H) 04/19/2023   CO2 30 04/19/2023   TSH 2.45 11/06/2022   HGBA1C 5.5 09/27/2014    MICROALBUR 0.7 04/19/2023    US Abdomen Complete Result Date: 04/10/2023 : PROCEDURE: ULTRASOUND ABDOMEN COMPLETE HISTORY: Patient is a 73 y/o  Female with R flank pain, fever. Thx COMPARISON: U/S 10/17/2020 TECHNIQUE: Two-dimensional grayscale and color Doppler ultrasound  of the abdomen was performed. FINDINGS: The pancreas is suboptimally visualized due to overlying bowel gas. The visualized segments appear unremarkable. The liver demonstrates normal echotexture without intrahepatic biliary dilatation. No masses are visualized. The main portal vein demonstrates normal hepatopedal flow. The gallbladder demonstrates normal anechoic echotexture without pericholecystic fluid or wall thickening. There are no gallstones. The common bile duct measures 0.4 cm. Negative sonographic Murphy's sign. The right kidney measures 10.3 cm in length. Renal cortical echotexture is mildly increased. There is no hydronephrosis. There are no stones. There are no cysts. The left kidney measures 10.1 cm in length. Renal cortical echotexture is mildly increased. There is no hydronephrosis. There are no stones. There are no cysts. The spleen measures 9.5 cm in length and demonstrates normal echotexture. There is no evidence of aneurysm within the visualized segments of the abdominal aorta. The visualized segments of the IVC are unremarkable. IMPRESSION: 1. Mild increase in echogenicity of the kidneys, suggestive of medical renal disease. Thank you for allowing [place] to assist in the care of this patient. Electronically Signed   By: Lestine Box M.D.   On: 04/10/2023 09:36    Assessment & Plan:   Problem List Items Addressed This Visit     LLQ abdominal pain   No relapse      Fatigue    Treat depression, insomnia Other tests if not better      Relevant Orders   Comprehensive metabolic panel with GFR   CBC with Differential/Platelet   Magnesium   TSH   Decreased GFR   GFR has normalized.  In the view of the evidence  of renal disease and ultrasound, would like to obtain a nephrology consultation.  The referral was made.      Muscle twitching - Primary   LUQ Superficial subcutaneous muscle in the left upper quadrant is twitching longitudinally 6 cm "band" -visible and palpable, periodic.  Marland Kitchen No mass or aneurysm is palpable.  No discoloration  Likely due to overuse -cut back on exercise, use heat, magnesium.      Relevant Orders   Comprehensive metabolic panel with GFR   CBC with Differential/Platelet   Magnesium   TSH      No orders of the defined types were placed in this encounter.     Follow-up: Return in about 3 months (around 10/15/2023) for a follow-up visit.  Sonda Primes, MD

## 2023-07-15 NOTE — Patient Instructions (Addendum)
 USEFUL THINGS FOR ARTHRITIS and musculoskeletal pains:    A "rice sock heating pad" refers to a homemade heating pad created by filling a sock with uncooked rice, which can be heated in a microwave to provide a warm compress for sore muscles, pain relief, or other applications; essentially, it's a simple way to generate heat using readily available materials.  Key points about rice sock heat: How to make it: Fill a clean sock (preferably a tube sock) about 2/3 full with uncooked rice, tie a knot at the top to secure the rice inside.  Heating it up: Place the rice sock in the microwave and heat in short intervals (usually around 30 seconds at a time) until it reaches the desired warmth.  Important considerations: Check temperature before applying: Always test the temperature of the rice sock before applying it to your skin to avoid burns.  Use a towel to protect skin: Wrap the rice sock in a thin towel to distribute the heat evenly and protect your skin.  Uses: Muscle aches and pains  Menstrual cramps  Neck pain  Arthritis discomfort    BLUE EMU CREAM: Use it 2-3 times a day on painful areas   Epsom salt

## 2023-07-15 NOTE — Assessment & Plan Note (Signed)
 GFR has normalized.  In the view of the evidence of renal disease and ultrasound, would like to obtain a nephrology consultation.  The referral was made.

## 2023-07-15 NOTE — Assessment & Plan Note (Addendum)
  Treat depression, insomnia Other tests if not better

## 2023-07-19 ENCOUNTER — Telehealth: Payer: Self-pay | Admitting: Internal Medicine

## 2023-07-19 NOTE — Telephone Encounter (Signed)
Followed up with patient via MyChart. 

## 2023-07-19 NOTE — Telephone Encounter (Signed)
 Copied from CRM [id number]. Topic: General - Other >> Jul 19, 2023 10:11 AM Aletta Edouard wrote: Reason for CRM: patient is needing a call regarding her medication she would like a call back

## 2023-07-19 NOTE — Assessment & Plan Note (Signed)
No relapse 

## 2023-08-16 ENCOUNTER — Telehealth: Payer: Self-pay | Admitting: Cardiology

## 2023-08-16 NOTE — Telephone Encounter (Signed)
 Patient calling to switch dr. She would like to start seeing a female dr. Please advise

## 2023-08-16 NOTE — Telephone Encounter (Signed)
 I am good with the switch to anyone she prefers

## 2023-09-03 ENCOUNTER — Other Ambulatory Visit (HOSPITAL_COMMUNITY): Payer: Self-pay

## 2023-10-01 ENCOUNTER — Other Ambulatory Visit (HOSPITAL_COMMUNITY): Payer: Self-pay

## 2023-10-06 DIAGNOSIS — H0014 Chalazion left upper eyelid: Secondary | ICD-10-CM | POA: Diagnosis not present

## 2023-10-06 DIAGNOSIS — H401211 Low-tension glaucoma, right eye, mild stage: Secondary | ICD-10-CM | POA: Diagnosis not present

## 2023-10-06 DIAGNOSIS — Z83511 Family history of glaucoma: Secondary | ICD-10-CM | POA: Diagnosis not present

## 2023-10-06 DIAGNOSIS — H401222 Low-tension glaucoma, left eye, moderate stage: Secondary | ICD-10-CM | POA: Diagnosis not present

## 2023-10-13 DIAGNOSIS — L659 Nonscarring hair loss, unspecified: Secondary | ICD-10-CM | POA: Diagnosis not present

## 2023-10-13 DIAGNOSIS — L658 Other specified nonscarring hair loss: Secondary | ICD-10-CM | POA: Diagnosis not present

## 2023-10-13 DIAGNOSIS — L650 Telogen effluvium: Secondary | ICD-10-CM | POA: Diagnosis not present

## 2023-11-16 DIAGNOSIS — M9904 Segmental and somatic dysfunction of sacral region: Secondary | ICD-10-CM | POA: Diagnosis not present

## 2023-11-16 DIAGNOSIS — M25551 Pain in right hip: Secondary | ICD-10-CM | POA: Diagnosis not present

## 2023-11-16 DIAGNOSIS — M9905 Segmental and somatic dysfunction of pelvic region: Secondary | ICD-10-CM | POA: Diagnosis not present

## 2023-11-16 DIAGNOSIS — M9903 Segmental and somatic dysfunction of lumbar region: Secondary | ICD-10-CM | POA: Diagnosis not present

## 2023-11-16 DIAGNOSIS — M25552 Pain in left hip: Secondary | ICD-10-CM | POA: Diagnosis not present

## 2023-11-16 DIAGNOSIS — M9908 Segmental and somatic dysfunction of rib cage: Secondary | ICD-10-CM | POA: Diagnosis not present

## 2023-11-16 DIAGNOSIS — M9906 Segmental and somatic dysfunction of lower extremity: Secondary | ICD-10-CM | POA: Diagnosis not present

## 2023-11-17 DIAGNOSIS — Z1231 Encounter for screening mammogram for malignant neoplasm of breast: Secondary | ICD-10-CM | POA: Diagnosis not present

## 2023-11-17 LAB — HM MAMMOGRAPHY

## 2023-11-22 ENCOUNTER — Encounter: Payer: Self-pay | Admitting: Internal Medicine

## 2023-12-07 ENCOUNTER — Other Ambulatory Visit: Payer: Self-pay | Admitting: Cardiology

## 2023-12-07 DIAGNOSIS — I6523 Occlusion and stenosis of bilateral carotid arteries: Secondary | ICD-10-CM

## 2023-12-07 DIAGNOSIS — E7800 Pure hypercholesterolemia, unspecified: Secondary | ICD-10-CM

## 2023-12-09 DIAGNOSIS — N3020 Other chronic cystitis without hematuria: Secondary | ICD-10-CM | POA: Diagnosis not present

## 2023-12-13 DIAGNOSIS — M9906 Segmental and somatic dysfunction of lower extremity: Secondary | ICD-10-CM | POA: Diagnosis not present

## 2023-12-13 DIAGNOSIS — G8929 Other chronic pain: Secondary | ICD-10-CM | POA: Diagnosis not present

## 2023-12-13 DIAGNOSIS — M9905 Segmental and somatic dysfunction of pelvic region: Secondary | ICD-10-CM | POA: Diagnosis not present

## 2023-12-13 DIAGNOSIS — M62838 Other muscle spasm: Secondary | ICD-10-CM | POA: Diagnosis not present

## 2023-12-13 DIAGNOSIS — M25561 Pain in right knee: Secondary | ICD-10-CM | POA: Diagnosis not present

## 2023-12-13 DIAGNOSIS — M25562 Pain in left knee: Secondary | ICD-10-CM | POA: Diagnosis not present

## 2023-12-24 ENCOUNTER — Telehealth: Payer: Self-pay | Admitting: Radiology

## 2023-12-24 NOTE — Telephone Encounter (Signed)
 Copied from CRM [id number]. Topic: Clinical - Prescription Issue >> Dec 24, 2023  3:05 PM Drema MATSU wrote: Reason for CRM: Patient stated that [facility] is needing a prescription for [facility] vaccine. >> Dec 24, 2023  3:11 PM CMA Lila C wrote: Wrong office

## 2024-01-02 NOTE — Telephone Encounter (Signed)
 What COVID vaccine that she went to get?  Pfizer booster?  Thanks

## 2024-01-06 NOTE — Telephone Encounter (Signed)
 LM for pt to call back and let us  know if she has a preference

## 2024-01-14 NOTE — Telephone Encounter (Unsigned)
 Copied from CRM [id number]. Topic: Clinical - Medication Question >> Jan 14, 2024  9:18 AM Thersia BROCKS wrote: Reason for CRM: Patient called in stated she would like the Pfizer vaccine , the latest booster she stated

## 2024-01-17 MED ORDER — COVID-19 MRNA VAC-TRIS(PFIZER) 30 MCG/0.3ML IM SUSY
0.3000 mL | PREFILLED_SYRINGE | Freq: Once | INTRAMUSCULAR | 0 refills | Status: AC
Start: 2024-01-17 — End: 2024-01-17

## 2024-01-17 NOTE — Addendum Note (Signed)
 Addended by: Tyshana Nishida V on: 01/17/2024 11:21 AM   Modules accepted: Orders

## 2024-01-17 NOTE — Telephone Encounter (Signed)
 Done. Thanks.

## 2024-01-19 DIAGNOSIS — L814 Other melanin hyperpigmentation: Secondary | ICD-10-CM | POA: Diagnosis not present

## 2024-01-19 DIAGNOSIS — L658 Other specified nonscarring hair loss: Secondary | ICD-10-CM | POA: Diagnosis not present

## 2024-01-19 DIAGNOSIS — I781 Nevus, non-neoplastic: Secondary | ICD-10-CM | POA: Diagnosis not present

## 2024-01-20 ENCOUNTER — Other Ambulatory Visit: Payer: Self-pay | Admitting: Internal Medicine

## 2024-01-26 ENCOUNTER — Other Ambulatory Visit: Payer: Self-pay | Admitting: Internal Medicine

## 2024-01-27 ENCOUNTER — Other Ambulatory Visit: Payer: Self-pay | Admitting: Internal Medicine

## 2024-01-27 ENCOUNTER — Other Ambulatory Visit (HOSPITAL_COMMUNITY): Payer: Self-pay

## 2024-01-27 MED ORDER — DILTIAZEM HCL ER COATED BEADS 180 MG PO CP24: 180.0000 mg | ORAL_CAPSULE | Freq: Every day | ORAL | 3 refills | Status: AC

## 2024-01-27 NOTE — Telephone Encounter (Signed)
 Copied from CRM #[id number]. Topic: Clinical - Medication Refill >> Jan 27, 2024  9:20 AM [place] A wrote: Medication: diltiazem  (CARDIZEM  CD) 180 MG 24 hr capsule  Has the patient contacted their pharmacy? No (Agent: If no, request that the patient contact the pharmacy for the refill. If patient does not wish to contact the pharmacy document the reason why and proceed with request.) (Agent: If yes, when and what did the pharmacy advise?)  This is the patient's preferred pharmacy:  [id number] [address] [facility] - [address] [place] [address] Phone: [phone] Fax: [phone]  Is this the correct pharmacy for this prescription? Yes If no, delete pharmacy and type the correct one.   Has the prescription been filled recently? No  Is the patient out of the medication? Yes  Has the patient been seen for an appointment in the last year OR does the patient have an upcoming appointment? Yes  Can we respond through MyChart? Yes  Agent: Please be advised that Rx refills may take up to 3 business days. We ask that you follow-up with your pharmacy.

## 2024-01-31 ENCOUNTER — Encounter: Payer: Self-pay | Admitting: Internal Medicine

## 2024-01-31 ENCOUNTER — Ambulatory Visit (INDEPENDENT_AMBULATORY_CARE_PROVIDER_SITE_OTHER): Admitting: Internal Medicine

## 2024-01-31 VITALS — BP 104/68 | HR 68 | Temp 98.0°F | Ht 65.0 in | Wt 151.4 lb

## 2024-01-31 DIAGNOSIS — G4700 Insomnia, unspecified: Secondary | ICD-10-CM

## 2024-01-31 DIAGNOSIS — R944 Abnormal results of kidney function studies: Secondary | ICD-10-CM | POA: Diagnosis not present

## 2024-01-31 DIAGNOSIS — F418 Other specified anxiety disorders: Secondary | ICD-10-CM

## 2024-01-31 DIAGNOSIS — R002 Palpitations: Secondary | ICD-10-CM | POA: Diagnosis not present

## 2024-01-31 DIAGNOSIS — I10 Essential (primary) hypertension: Secondary | ICD-10-CM | POA: Diagnosis not present

## 2024-01-31 DIAGNOSIS — R5383 Other fatigue: Secondary | ICD-10-CM

## 2024-01-31 NOTE — Assessment & Plan Note (Signed)
Continue on diltiazem.  Status post ablation.

## 2024-01-31 NOTE — Assessment & Plan Note (Signed)
 GFR has normalized.  In the view of the evidence of renal disease and ultrasound, would like to obtain a nephrology consultation.  The referral was made.

## 2024-01-31 NOTE — Assessment & Plan Note (Signed)
Lexapro.  Lorazepam prn.  Potential benefits of a long term benzodiazepines  use as well as potential risks  and complications were explained to the patient and were aknowledged. 

## 2024-01-31 NOTE — Progress Notes (Signed)
 Subjective:  Patient ID: Kristin Torres, female    DOB: 1950-07-08  Age: 73 y.o. MRN: [id number]  CC: Medical Management of Chronic Issues (Medication refills, follow up)   HPI  Kristin Torres presents for UTIs, insomnia, tachycardia  Outpatient Medications Prior to Visit  Medication Sig Dispense Refill   ASPIRIN  81 PO Take by mouth.     atorvastatin  (LIPITOR) 20 MG tablet TAKE 1 TABLET BY MOUTH EVERY DAY 90 tablet 0   Cholecalciferol  (VITAMIN D3) 50 MCG (2000 UT) capsule Take 1 capsule (2,000 Units total) by mouth daily. 100 capsule 3   diltiazem  (CARDIZEM  CD) 180 MG 24 hr capsule Take 1 capsule (180 mg total) by mouth daily. 90 capsule 3   EPINEPHrine  0.3 mg/0.3 mL IJ SOAJ injection Inject 0.3 mg into the muscle as needed for anaphylaxis. 2 each 3   escitalopram  (LEXAPRO ) 5 MG tablet Take 1 tablet (5 mg total) by mouth at bedtime. 30 tablet 5   estradiol (ESTRACE) 0.1 MG/GM vaginal cream Place vaginally.     fexofenadine (ALLEGRA) 180 MG tablet Take 180 mg by mouth daily as needed. allergies     latanoprost  (XALATAN ) 0.005 % ophthalmic solution Place 1 drop into both eyes at bedtime. 7.5 mL 3   LORazepam  (ATIVAN ) 0.5 MG tablet Take 1 tablet (0.5 mg total) by mouth 2 (two) times daily as needed for anxiety. 60 tablet 1   losartan -hydrochlorothiazide  (HYZAAR ) 50-12.5 MG tablet TAKE 1 TABLET BY MOUTH EVERY DAY 90 tablet 3   meclizine  (ANTIVERT ) 12.5 MG tablet TAKE 1 TABLET BY MOUTH 3 TIMES DAILY AS NEEDED FOR DIZZINESS. 60 tablet 0   nitrofurantoin , macrocrystal-monohydrate, (MACROBID ) 100 MG capsule Take 1 capsule (100 mg total) by mouth 2 (two) times daily. 10 capsule 3   Omega-3 Fatty Acids (FISH OIL) 1000 MG CPDR Take by mouth.     ondansetron  (ZOFRAN ) 4 MG tablet Take 1 tablet (4 mg total) by mouth every 8 (eight) hours as needed for nausea or vomiting. 20 tablet 0   zaleplon  (SONATA ) 10 MG capsule Take 1 capsule (10 mg total) by mouth at bedtime as needed for sleep. 90 capsule 1    ciprofloxacin  (CIPRO ) 500 MG tablet Take 1 tablet (500 mg total) by mouth 2 (two) times daily. (Patient not taking: Reported on 01/31/2024) 14 tablet 0   atorvastatin  (LIPITOR) 20 MG tablet Take 1 tablet (20 mg total) by mouth daily. 90 tablet 2   No facility-administered medications prior to visit.    ROS: Review of Systems  Constitutional:  Negative for activity change, appetite change, chills, fatigue and unexpected weight change.  HENT:  Negative for congestion, mouth sores and sinus pressure.   Eyes:  Negative for visual disturbance.  Respiratory:  Negative for cough and chest tightness.   Gastrointestinal:  Negative for abdominal pain and nausea.  Genitourinary:  Negative for difficulty urinating, frequency and vaginal pain.  Musculoskeletal:  Negative for back pain and gait problem.  Skin:  Negative for pallor and rash.  Neurological:  Negative for dizziness, tremors, weakness, numbness and headaches.  Psychiatric/Behavioral:  Negative for confusion and sleep disturbance.     Objective:  BP 104/68   Pulse 68   Temp 98 F (36.7 C)   Ht 5' 5 (1.651 m)   Wt 151 lb 6.4 oz (68.7 kg)   SpO2 99%   BMI 25.19 kg/m   BP Readings from Last 3 Encounters:  01/31/24 104/68  07/15/23 (!) 90/50  06/23/23 118/62    Wt  Readings from Last 3 Encounters:  01/31/24 151 lb 6.4 oz (68.7 kg)  07/15/23 149 lb (67.6 kg)  06/23/23 148 lb (67.1 kg)    Physical Exam Constitutional:      General: She is not in acute distress.    Appearance: She is well-developed. She is obese.  HENT:     Head: Normocephalic.     Right Ear: External ear normal.     Left Ear: External ear normal.     Nose: Nose normal.  Eyes:     General:        Right eye: No discharge.        Left eye: No discharge.     Conjunctiva/sclera: Conjunctivae normal.     Pupils: Pupils are equal, round, and reactive to light.  Neck:     Thyroid : No thyromegaly.     Vascular: No JVD.     Trachea: No tracheal deviation.   Cardiovascular:     Rate and Rhythm: Normal rate and regular rhythm.     Heart sounds: Normal heart sounds.  Pulmonary:     Effort: No respiratory distress.     Breath sounds: No stridor. No wheezing.  Abdominal:     General: Bowel sounds are normal. There is no distension.     Palpations: Abdomen is soft. There is no mass.     Tenderness: There is no abdominal tenderness. There is no guarding or rebound.  Musculoskeletal:        General: No tenderness.     Cervical back: Normal range of motion and neck supple. No rigidity.  Lymphadenopathy:     Cervical: No cervical adenopathy.  Skin:    Findings: No erythema or rash.  Neurological:     Cranial Nerves: No cranial nerve deficit.     Motor: No abnormal muscle tone.     Coordination: Coordination normal.     Deep Tendon Reflexes: Reflexes normal.  Psychiatric:        Behavior: Behavior normal.        Thought Content: Thought content normal.        Judgment: Judgment normal.     Lab Results  Component Value Date   WBC 6.1 04/19/2023   HGB 12.7 04/19/2023   HCT 37.7 04/19/2023   PLT 249.0 04/19/2023   GLUCOSE 102 (H) 04/19/2023   CHOL 177 05/10/2023   TRIG 38 05/10/2023   HDL 72 05/10/2023   LDLCALC 97 05/10/2023   ALT 36 (H) 04/19/2023   AST 32 04/19/2023   NA 140 04/19/2023   K 4.1 04/19/2023   CL 102 04/19/2023   CREATININE 0.85 04/19/2023   BUN 25 (H) 04/19/2023   CO2 30 04/19/2023   TSH 2.45 11/06/2022   HGBA1C 5.5 09/27/2014    US  Abdomen Complete Result Date: 04/10/2023 : PROCEDURE: ULTRASOUND ABDOMEN COMPLETE HISTORY: Patient is a 73 y/o  Female with R flank pain, fever. Thx COMPARISON: U/S 10/17/2020 TECHNIQUE: Two-dimensional grayscale and color Doppler ultrasound of the abdomen was performed. FINDINGS: The pancreas is suboptimally visualized due to overlying bowel gas. The visualized segments appear unremarkable. The liver demonstrates normal echotexture without intrahepatic biliary dilatation. No masses  are visualized. The main portal vein demonstrates normal hepatopedal flow. The gallbladder demonstrates normal anechoic echotexture without pericholecystic fluid or wall thickening. There are no gallstones. The common bile duct measures 0.4 cm. Negative sonographic Murphy's sign. The right kidney measures 10.3 cm in length. Renal cortical echotexture is mildly increased. There is no hydronephrosis. There are  no stones. There are no cysts. The left kidney measures 10.1 cm in length. Renal cortical echotexture is mildly increased. There is no hydronephrosis. There are no stones. There are no cysts. The spleen measures 9.5 cm in length and demonstrates normal echotexture. There is no evidence of aneurysm within the visualized segments of the abdominal aorta. The visualized segments of the IVC are unremarkable. IMPRESSION: 1. Mild increase in echogenicity of the kidneys, suggestive of medical renal disease. Thank you for allowing us  to assist in the care of this patient. Electronically Signed   By: Lynwood Mains M.D.   On: 04/10/2023 09:36    Assessment & Plan:   Problem List Items Addressed This Visit     Decreased GFR - Primary   GFR has normalized.  In the view of the evidence of renal disease and ultrasound, would like to obtain a nephrology consultation.  The referral was made.      Depression with anxiety   Lexapro .  Lorazepam  prn.  Potential benefits of a long term benzodiazepines  use as well as potential risks  and complications were explained to the patient and were aknowledged.      Fatigue   Treat insomnia Other tests if not better      HTN (hypertension)   Cont on Hyzaar , Diltiazem       Insomnia   The patient has been working out 5 days a week, walking daily. She is complaining of pain in the hips and lateral thighs. I suggested cutting back on exercise.  However, Kristin Torres states that she is exercising so much to help her sleep at night.  She will try to find a better balance. Hip  opener exercises, heat, stretching advised Blue-Emu cream was recommended to use 2-3 times a day      Palpitations   Continue on diltiazem .  Status post ablation.         No orders of the defined types were placed in this encounter.     Follow-up: No follow-ups on file.  Marolyn Noel, MD

## 2024-01-31 NOTE — Assessment & Plan Note (Signed)
Cont on Hyzaar, Diltiazem 

## 2024-01-31 NOTE — Assessment & Plan Note (Signed)
 Treat insomnia Other tests if not better

## 2024-01-31 NOTE — Assessment & Plan Note (Signed)
 The patient has been working out 5 days a week, walking daily. She is complaining of pain in the hips and lateral thighs. I suggested cutting back on exercise.  However, Kristin Torres states that she is exercising so much to help her sleep at night.  She will try to find a better balance. Hip opener exercises, heat, stretching advised Blue-Emu cream was recommended to use 2-3 times a day

## 2024-03-06 ENCOUNTER — Encounter: Payer: Self-pay | Admitting: Podiatry

## 2024-03-06 ENCOUNTER — Ambulatory Visit: Admitting: Podiatry

## 2024-03-06 DIAGNOSIS — L03031 Cellulitis of right toe: Secondary | ICD-10-CM | POA: Diagnosis not present

## 2024-03-06 DIAGNOSIS — L02611 Cutaneous abscess of right foot: Secondary | ICD-10-CM | POA: Diagnosis not present

## 2024-03-06 DIAGNOSIS — B353 Tinea pedis: Secondary | ICD-10-CM

## 2024-03-06 MED ORDER — KETOCONAZOLE 2 % EX CREA: 1.0000 | TOPICAL_CREAM | Freq: Two times a day (BID) | CUTANEOUS | 3 refills | Status: AC

## 2024-03-06 MED ORDER — DOXYCYCLINE HYCLATE 100 MG PO TABS: 100.0000 mg | ORAL_TABLET | Freq: Two times a day (BID) | ORAL | 0 refills | Status: AC

## 2024-03-06 NOTE — Progress Notes (Signed)
 Patient presents with complaint of itching in between the 4th and 5th toes on the right and some on the left.  Has noticed a getting red and a little swollen.  No fever chills nausea or vomiting.   Physical exam:  General appearance: Pleasant, and in no acute distress. AOx3.  Vascular: Pedal pulses: DP 2/4 bilaterally, PT 2/4 bilaterally. Mild edema lower legs bilaterally. Capillary fill time immediate bilaterally.  Neurological: Light touch intact feet bilaterally.  Normal Achilles reflex bilaterally.  No clonus or spasticity noted.   Dermatologic:   Macerations and skin fissures in the fourth webspace bilaterally.  Redness and clear drainage and a blister on the webspace for right.  Cheilitis extending over the fourth IMS right.  Skin normal temperature bilaterally.  Skin normal color, tone, and texture bilaterally.   Musculoskeletal: Hammertoes 4 and 5 bilaterally.  Strength lower extremity bilaterally  Diagnosis: 1.  Tinea pedis bilaterally. 2.  Cellulitis fourth fifth toes right  Plan: -New patient office for evaluation management level 3. - Discussed with the tinea pedis and probable secondary bacterial infection on the right.  Started to get some early signs of interdigital tinea on the left foot also.  Will have her use ketoconazole cream twice daily on these areas.  Also place her on oral doxycycline  since it looks like she is developing some cellulitis with a secondary bacterial infection. -Rx ketoconazole cream applied twice daily to affected area, refill x 2 - Rx doxycycline  100 mg 1 p.o. twice daily for 7 days. -Dispensed OTC orthotics bilaterally  Return 4 weeks follow-up tinea pedis

## 2024-03-07 ENCOUNTER — Encounter: Payer: Self-pay | Admitting: Internal Medicine

## 2024-03-07 ENCOUNTER — Ambulatory Visit: Attending: Internal Medicine | Admitting: Internal Medicine

## 2024-03-07 ENCOUNTER — Other Ambulatory Visit: Payer: Self-pay | Admitting: Podiatry

## 2024-03-07 ENCOUNTER — Telehealth: Payer: Self-pay | Admitting: Podiatry

## 2024-03-07 VITALS — BP 114/70 | HR 77 | Ht 65.0 in | Wt 153.0 lb

## 2024-03-07 DIAGNOSIS — I10 Essential (primary) hypertension: Secondary | ICD-10-CM

## 2024-03-07 DIAGNOSIS — I4719 Other supraventricular tachycardia: Secondary | ICD-10-CM

## 2024-03-07 DIAGNOSIS — Z9889 Other specified postprocedural states: Secondary | ICD-10-CM

## 2024-03-07 DIAGNOSIS — I214 Non-ST elevation (NSTEMI) myocardial infarction: Secondary | ICD-10-CM

## 2024-03-07 MED ORDER — KETOCONAZOLE 2 % EX CREA: 1.0000 | TOPICAL_CREAM | Freq: Every day | CUTANEOUS | 3 refills | Status: DC

## 2024-03-07 NOTE — Telephone Encounter (Signed)
 Patient states that the pharmacy didn't receive script for ketoconazole.

## 2024-03-07 NOTE — Patient Instructions (Addendum)
 Medication Instructions:  Continue same medications *If you need a refill on your cardiac medications before your next appointment, please call your pharmacy*  Lab Work: TSH,Vit D,NMR lipid panel,lpa,apob,magnesium,cbc,cmet  Have done fasting If you have labs (blood work) drawn today and your tests are completely normal, you will receive your results only by: MyChart Message (if you have MyChart) OR A paper copy in the mail If you have any lab test that is abnormal or we need to change your treatment, we will call you to review the results.  Testing/Procedures: None ordered  Follow-Up: At [facility], you and your health needs are our priority.  As part of our continuing mission to provide you with exceptional heart care, our providers are all part of one team.  This team includes your primary Cardiologist (physician) and Advanced Practice Providers or APPs (Physician Assistants and Nurse Practitioners) who all work together to provide you with the care you need, when you need it.  Your next appointment:  1 year     Call in August to schedule Nov appointment     Provider:  Dr.Ross   We recommend signing up for the patient portal called MyChart.  Sign up information is provided on this After Visit Summary.  MyChart is used to connect with patients for Virtual Visits (Telemedicine).  Patients are able to view lab/test results, encounter notes, upcoming appointments, etc.  Non-urgent messages can be sent to your provider as well.   To learn more about what you can do with MyChart, go to [email or web address].

## 2024-03-07 NOTE — Progress Notes (Unsigned)
 Cardiology Office Note   Date:  03/07/2024   ID:  Rocklyn Mayberry, DOB 02-22-1951, MRN [id number]  PCP:  Garald Karlynn GAILS, MD  Cardiologist:   Vina Gull, MD   PT presents for follow up of HTN and HL    History of Present Illness: Kristin Torres is a 73 y.o. female with a history of HL, HTN, SVT (s/p ablation)   LBBB    2016  SVT ablation  2024  Lexiscan  Myoview   LVEF normal  Normal perfusion  2024 Carotid USN  Minimal herterogeneous plaque   2024  Echo  LVEF 60 to 65%  AV  Mean gradient 10 mm     The pt was last seen in cardiology by JINNY Mody in Nov 2024   She is very active  Exercises, walks, dances   She denies CP   She does say that with exercising she feels like she hits at max because she is overheating, can't sweat  Would like to be able to do more  No palpitations  No dizziness  Diet  Br:  Nonfat  Chobani greek with spoon of jam no sugar  Tablespoon cereal homemade in yogurt Decaf coffee  Sweetner  Monk Lunch:  Salad Protein   Crackers Decaf coffee Dinner:  CHicken or fish  Salad   Veggies  Decaf tea  Snacks  No snacks   Current Meds  Medication Sig   ASPIRIN  81 PO Take by mouth.   atorvastatin  (LIPITOR) 20 MG tablet TAKE 1 TABLET BY MOUTH EVERY DAY   Cholecalciferol  (VITAMIN D3) 50 MCG (2000 UT) capsule Take 1 capsule (2,000 Units total) by mouth daily.   diltiazem  (CARDIZEM  CD) 180 MG 24 hr capsule Take 1 capsule (180 mg total) by mouth daily.   dorzolamide (TRUSOPT) 2 % ophthalmic solution Place 1 drop into both eyes 2 (two) times daily.   doxycycline  (VIBRA -TABS) 100 MG tablet Take 1 tablet (100 mg total) by mouth 2 (two) times daily for 7 days.   EPINEPHrine  0.3 mg/0.3 mL IJ SOAJ injection Inject 0.3 mg into the muscle as needed for anaphylaxis.   escitalopram  (LEXAPRO ) 5 MG tablet Take 1 tablet (5 mg total) by mouth at bedtime. (Patient taking differently: Take 5 mg by mouth as needed.)   fexofenadine (ALLEGRA) 180 MG tablet Take 180 mg by mouth daily as  needed. allergies   ketoconazole (NIZORAL) 2 % cream Apply 1 Application topically 2 (two) times daily. Apply 1-2 grams to feet bid   ketoconazole (NIZORAL) 2 % cream Apply 1 Application topically daily. Ketoconazole cream, apply 1 g twice daily to each foot   latanoprost  (XALATAN ) 0.005 % ophthalmic solution Place 1 drop into both eyes at bedtime.   LORazepam  (ATIVAN ) 0.5 MG tablet Take 1 tablet (0.5 mg total) by mouth 2 (two) times daily as needed for anxiety.   losartan -hydrochlorothiazide  (HYZAAR ) 50-12.5 MG tablet TAKE 1 TABLET BY MOUTH EVERY DAY   meclizine  (ANTIVERT ) 12.5 MG tablet TAKE 1 TABLET BY MOUTH 3 TIMES DAILY AS NEEDED FOR DIZZINESS.   Omega-3 Fatty Acids (FISH OIL) 1000 MG CPDR Take by mouth.   zaleplon  (SONATA ) 10 MG capsule Take 1 capsule (10 mg total) by mouth at bedtime as needed for sleep.     Allergies:   Bee venom, Streptomycin, Timolol, Mango flavoring agent (non-screening), Rocephin  [ceftriaxone ], Caffeine, Other, and Penicillins   Past Medical History:  Diagnosis Date   Cervicalgia    Chronic back pain    Dermatophytosis of nail  GERD (gastroesophageal reflux disease)    History of pneumonia    Hypertension    Leg cramps    MI (myocardial infarction) (HCC)    Nocturia    Personal history of other diseases of circulatory system    PSVT (paroxysmal supraventricular tachycardia)    Sinusitis    SVT (supraventricular tachycardia)    Tubal pregnancy    Tubular adenoma of colon     Past Surgical History:  Procedure Laterality Date   ABLATION OF DYSRHYTHMIC FOCUS  04/25/2014   svt   APPENDECTOMY     BREAST EXCISIONAL BIOPSY Right 2011   benign  right breast   OOPHORECTOMY     POLYPECTOMY     SUPRAVENTRICULAR TACHYCARDIA ABLATION N/A 04/25/2014   Procedure: SUPRAVENTRICULAR TACHYCARDIA ABLATION;  Surgeon: Danelle LELON Birmingham, MD;  Location: [facility] CATH LAB;  Service: Cardiovascular;  Laterality: N/A;   TUBAL LIGATION       Social History:  The patient   reports that she has never smoked. She has never used smokeless tobacco. She reports current alcohol use of about 3.0 standard drinks of alcohol per week. She reports that she does not use drugs.   Family History:  The patient's family history includes Breast cancer in her maternal grandmother; Diabetes in her maternal grandmother and mother; Glaucoma in her brother and mother; Heart attack (age of onset: 22) in her father; Hypertension in her maternal grandfather, maternal grandmother, and mother; Stomach cancer in her paternal grandmother.    ROS:  Please see the history of present illness. All other systems are reviewed and  Negative to the above problem except as noted.    PHYSICAL EXAM: VS:  BP 114/70 (BP Location: Left Arm, Patient Position: Sitting, Cuff Size: Normal)   Pulse 77   Ht 5' 5 (1.651 m)   Wt 153 lb (69.4 kg)   SpO2 96%   BMI 25.46 kg/m   GEN: Well nourished, well developed, in no acute distress  HEENT: normal  Neck: no JVD, carotid bruits Cardiac: RRR; no murmur Respiratory:  clear to auscultation GI: soft, nontender, no masses  No hepatomegaly  MS: no deformity Moving all extremities   Ext  No LE edema   EKG:  EKG is ordered today.  NSR 77 bpm  LBBB   Cardiac studies   Carotid artery duplex 01/08/23: Color duplex indicates minimal heterogeneous and calcified plaque, with no hemodynamically significant stenosis by duplex criteria in the extracranial cerebrovascular circulation. Antegrade bilateral vertebral flow.   MYOCARDIAL PERFUSION IMAGING 02/03/2023   Narrative   The study is normal. The study is low risk.   0.5 mm of down sloping ST depression (II, III, aVF, V4 and V5) was noted.   Left ventricular function is normal. Nuclear stress EF: 77%. The left ventricular ejection fraction is hyperdynamic (>65%). End diastolic cavity size is normal.   ECHOCARDIOGRAM COMPLETE 02/02/2023  1. Left ventricular ejection fraction, by estimation, is 60 to 65%.  Left ventricular ejection fraction by PLAX is 64 %. The left ventricle has normal function. The left ventricle has no regional wall motion abnormalities. Left ventricular diastolic parameters are consistent with Grade I diastolic dysfunction (impaired relaxation). Elevated left ventricular end-diastolic pressure. 2. Right ventricular systolic function is normal. The right ventricular size is normal. There is normal pulmonary artery systolic pressure. 3. The mitral valve is normal in structure. No evidence of mitral valve regurgitation. No evidence of mitral stenosis. 4. The aortic valve is tricuspid. There is mild calcification of the  aortic valve. There is mild thickening of the aortic valve. Aortic valve regurgitation is trivial. Aortic valve sclerosis/calcification is present, without any evidence of aortic stenosis. Aortic valve area, by VTI measures 1.25 cm. Aortic valve mean gradient measures 10.0 mmHg. Aortic valve Vmax measures 2.15 m/s. 5. The inferior vena cava is normal in size with greater than 50% respiratory variability, suggesting right atrial pressure of 3 mmHg.  Lipid Panel    Component Value Date/Time   CHOL 177 05/10/2023 1008   TRIG 38 05/10/2023 1008   HDL 72 05/10/2023 1008   CHOLHDL 2.5 05/10/2023 1008   CHOLHDL 3 11/06/2022 0837   VLDL 9.2 11/06/2022 0837   LDLCALC 97 05/10/2023 1008      Wt Readings from Last 3 Encounters:  03/07/24 153 lb (69.4 kg)  01/31/24 151 lb 6.4 oz (68.7 kg)  07/15/23 149 lb (67.6 kg)      ASSESSMENT AND PLAN:  1  Atherosclerosis  PT with evid of minimal carotid plaquing on USN in 2024   She is not symptomatic    NO symptoms of CP Plan to Rx risk factors  2  HL  Pt taking lipitor   Will Rx NMR panel, Lpa and Apo B  3  LBBB   New   She had a myoview  in 2024  Normal  She is without anginal symptoms   No dizziness Follow     4   Exercise  PT is very active    Complains of hitting limit due to overheating    Will review with  exercise physiologist for recommendations      Tentative plan for follow up in 1 year    Current medicines are reviewed at length with the patient today.  The patient does not have concerns regarding medicines.  Signed, Vina Gull, MD  03/07/2024 10:31 AM    [facility] [address] [place], [place], [place]  [address] Phone: [phone]; Fax: [phone]

## 2024-03-08 DIAGNOSIS — I10 Essential (primary) hypertension: Secondary | ICD-10-CM | POA: Diagnosis not present

## 2024-03-08 DIAGNOSIS — Z9889 Other specified postprocedural states: Secondary | ICD-10-CM | POA: Diagnosis not present

## 2024-03-08 DIAGNOSIS — I214 Non-ST elevation (NSTEMI) myocardial infarction: Secondary | ICD-10-CM | POA: Diagnosis not present

## 2024-03-08 DIAGNOSIS — I4719 Other supraventricular tachycardia: Secondary | ICD-10-CM | POA: Diagnosis not present

## 2024-03-08 NOTE — Addendum Note (Signed)
 Addended by: OKEY VINA GAILS on: 03/08/2024 09:33 PM   Modules accepted: Level of Service

## 2024-03-13 ENCOUNTER — Telehealth: Payer: Self-pay | Admitting: Internal Medicine

## 2024-03-13 ENCOUNTER — Ambulatory Visit: Payer: Self-pay | Admitting: Internal Medicine

## 2024-03-13 DIAGNOSIS — I6523 Occlusion and stenosis of bilateral carotid arteries: Secondary | ICD-10-CM

## 2024-03-13 DIAGNOSIS — E7800 Pure hypercholesterolemia, unspecified: Secondary | ICD-10-CM

## 2024-03-13 LAB — COMPREHENSIVE METABOLIC PANEL WITH GFR
ALT: 31 IU/L (ref 0–32)
AST: 30 IU/L (ref 0–40)
Albumin: 4.6 g/dL (ref 3.8–4.8)
Alkaline Phosphatase: 105 IU/L (ref 49–135)
BUN/Creatinine Ratio: 23 (ref 12–28)
BUN: 20 mg/dL (ref 8–27)
Bilirubin Total: 0.4 mg/dL (ref 0.0–1.2)
CO2: 27 mmol/L (ref 20–29)
Calcium: 10 mg/dL (ref 8.7–10.3)
Chloride: 102 mmol/L (ref 96–106)
Creatinine, Ser: 0.86 mg/dL (ref 0.57–1.00)
Globulin, Total: 2.4 g/dL (ref 1.5–4.5)
Glucose: 96 mg/dL (ref 70–99)
Potassium: 4.8 mmol/L (ref 3.5–5.2)
Sodium: 142 mmol/L (ref 134–144)
Total Protein: 7 g/dL (ref 6.0–8.5)
eGFR: 72 mL/min/1.73 (ref >59)

## 2024-03-13 LAB — CBC WITH DIFFERENTIAL/PLATELET
Basophils Absolute: 0 x10E3/uL (ref 0.0–0.2)
Basos: 1 %
EOS (ABSOLUTE): 0.3 x10E3/uL (ref 0.0–0.4)
Eos: 5 %
Hematocrit: 39.8 % (ref 34.0–46.6)
Hemoglobin: 12.8 g/dL (ref 11.1–15.9)
Immature Grans (Abs): 0 x10E3/uL (ref 0.0–0.1)
Immature Granulocytes: 0 %
Lymphocytes Absolute: 2.3 x10E3/uL (ref 0.7–3.1)
Lymphs: 43 %
MCH: 31.3 pg (ref 26.6–33.0)
MCHC: 32.2 g/dL (ref 31.5–35.7)
MCV: 97 fL (ref 79–97)
Monocytes Absolute: 0.3 x10E3/uL (ref 0.1–0.9)
Monocytes: 6 %
Neutrophils Absolute: 2.3 x10E3/uL (ref 1.4–7.0)
Neutrophils: 45 %
Platelets: 223 x10E3/uL (ref 150–450)
RBC: 4.09 x10E6/uL (ref 3.77–5.28)
RDW: 11.6 % — ABNORMAL LOW (ref 11.7–15.4)
WBC: 5.2 x10E3/uL (ref 3.4–10.8)

## 2024-03-13 LAB — NMR, LIPOPROFILE
Cholesterol, Total: 178 mg/dL (ref 100–199)
HDL Particle Number: 32.7 umol/L (ref >=30.5)
HDL-C: 81 mg/dL (ref >39)
LDL Particle Number: 567 nmol/L (ref <1000)
LDL Size: 21 nm (ref >20.5)
LDL-C (NIH Calc): 90 mg/dL (ref 0–99)
LP-IR Score: <25 (ref <=45)
Small LDL Particle Number: <90 nmol/L (ref <=527)
Triglycerides: 47 mg/dL (ref 0–149)

## 2024-03-13 LAB — APOLIPOPROTEIN B: Apolipoprotein B: 87 mg/dL (ref <90)

## 2024-03-13 LAB — VITAMIN D 25 HYDROXY (VIT D DEFICIENCY, FRACTURES): Vit D, 25-Hydroxy: 71.6 ng/mL (ref 30.0–100.0)

## 2024-03-13 LAB — LIPOPROTEIN A (LPA): Lipoprotein (a): 217.2 nmol/L — ABNORMAL HIGH (ref <75.0)

## 2024-03-13 LAB — MAGNESIUM: Magnesium: 2.2 mg/dL (ref 1.6–2.3)

## 2024-03-13 NOTE — Telephone Encounter (Signed)
*  STAT* If patient is at the pharmacy, call can be transferred to refill team.   1. Which medications need to be refilled? (please list name of each medication and dose if known) atorvastatin  (LIPITOR) 20 MG tablet    2. Which pharmacy/location (including street and city if local pharmacy) is medication to be sent to? [id number] - [place], [place] - [address]    3. Do they need a 30 day or 90 day supply?  90 day supply

## 2024-03-14 MED ORDER — EZETIMIBE 10 MG PO TABS: 10.0000 mg | ORAL_TABLET | Freq: Every day | ORAL | 3 refills | Status: AC

## 2024-03-14 MED ORDER — ATORVASTATIN CALCIUM 20 MG PO TABS: 20.0000 mg | ORAL_TABLET | Freq: Every day | ORAL | 3 refills | Status: AC

## 2024-03-14 NOTE — Telephone Encounter (Signed)
 Refill sent.

## 2024-04-05 ENCOUNTER — Ambulatory Visit: Admitting: Podiatry

## 2024-05-26 LAB — APOLIPOPROTEIN B

## 2024-05-26 LAB — HEPATIC FUNCTION PANEL
ALT: 32 [IU]/L (ref 0–32)
AST: 32 [IU]/L (ref 0–40)
Albumin: 4.8 g/dL (ref 3.8–4.8)
Alkaline Phosphatase: 95 [IU]/L (ref 49–135)
Bilirubin Total: 0.5 mg/dL (ref 0.0–1.2)
Total Protein: 7 g/dL (ref 6.0–8.5)

## 2024-05-26 LAB — NMR, LIPOPROFILE

## 2024-05-26 LAB — HEMOGLOBIN A1C
Est. average glucose Bld gHb Est-mCnc: 117 mg/dL
Hgb A1c MFr Bld: 5.7 % — ABNORMAL HIGH (ref 4.8–5.6)

## 2024-05-26 LAB — C-REACTIVE PROTEIN
